# Patient Record
Sex: Female | Born: 1965
Health system: Southern US, Community
[De-identification: ages and names within clinical notes are randomized; demographics above are authoritative.]

## PROBLEM LIST (undated history)

## (undated) DIAGNOSIS — E119 Type 2 diabetes mellitus without complications: Secondary | ICD-10-CM

## (undated) DIAGNOSIS — I10 Essential (primary) hypertension: Secondary | ICD-10-CM

---

## 2020-10-12 ENCOUNTER — Other Ambulatory Visit: Payer: Self-pay

## 2020-10-12 ENCOUNTER — Emergency Department (HOSPITAL_COMMUNITY): Payer: BC Managed Care – PPO

## 2020-10-12 ENCOUNTER — Emergency Department (HOSPITAL_BASED_OUTPATIENT_CLINIC_OR_DEPARTMENT_OTHER): Payer: BC Managed Care – PPO

## 2020-10-12 ENCOUNTER — Encounter (HOSPITAL_BASED_OUTPATIENT_CLINIC_OR_DEPARTMENT_OTHER): Payer: Self-pay | Admitting: Emergency Medicine

## 2020-10-12 ENCOUNTER — Inpatient Hospital Stay (HOSPITAL_BASED_OUTPATIENT_CLINIC_OR_DEPARTMENT_OTHER)
Admission: EM | Admit: 2020-10-12 | Discharge: 2020-10-18 | DRG: 065 | Disposition: A | Discharge status: Rehabilitation Facility | Payer: BC Managed Care – PPO | Attending: Family Medicine | Admitting: Family Medicine

## 2020-10-12 DIAGNOSIS — R29898 Other symptoms and signs involving the musculoskeletal system: Secondary | ICD-10-CM

## 2020-10-12 DIAGNOSIS — I672 Cerebral atherosclerosis: Secondary | ICD-10-CM | POA: Diagnosis not present

## 2020-10-12 DIAGNOSIS — I6521 Occlusion and stenosis of right carotid artery: Secondary | ICD-10-CM | POA: Diagnosis present

## 2020-10-12 DIAGNOSIS — R471 Dysarthria and anarthria: Secondary | ICD-10-CM | POA: Diagnosis not present

## 2020-10-12 DIAGNOSIS — R001 Bradycardia, unspecified: Secondary | ICD-10-CM | POA: Diagnosis not present

## 2020-10-12 DIAGNOSIS — I639 Cerebral infarction, unspecified: Secondary | ICD-10-CM | POA: Diagnosis not present

## 2020-10-12 DIAGNOSIS — E876 Hypokalemia: Secondary | ICD-10-CM | POA: Diagnosis not present

## 2020-10-12 DIAGNOSIS — Z79899 Other long term (current) drug therapy: Secondary | ICD-10-CM

## 2020-10-12 DIAGNOSIS — I447 Left bundle-branch block, unspecified: Secondary | ICD-10-CM | POA: Diagnosis present

## 2020-10-12 DIAGNOSIS — M549 Dorsalgia, unspecified: Secondary | ICD-10-CM | POA: Diagnosis present

## 2020-10-12 DIAGNOSIS — Y92239 Unspecified place in hospital as the place of occurrence of the external cause: Secondary | ICD-10-CM | POA: Diagnosis not present

## 2020-10-12 DIAGNOSIS — M6281 Muscle weakness (generalized): Secondary | ICD-10-CM | POA: Diagnosis not present

## 2020-10-12 DIAGNOSIS — E785 Hyperlipidemia, unspecified: Secondary | ICD-10-CM | POA: Diagnosis present

## 2020-10-12 DIAGNOSIS — R32 Unspecified urinary incontinence: Secondary | ICD-10-CM | POA: Diagnosis not present

## 2020-10-12 DIAGNOSIS — M25362 Other instability, left knee: Secondary | ICD-10-CM | POA: Diagnosis not present

## 2020-10-12 DIAGNOSIS — Z6841 Body Mass Index (BMI) 40.0 and over, adult: Secondary | ICD-10-CM | POA: Diagnosis not present

## 2020-10-12 DIAGNOSIS — R29702 NIHSS score 2: Secondary | ICD-10-CM | POA: Diagnosis not present

## 2020-10-12 DIAGNOSIS — H02402 Unspecified ptosis of left eyelid: Secondary | ICD-10-CM | POA: Diagnosis present

## 2020-10-12 DIAGNOSIS — I69354 Hemiplegia and hemiparesis following cerebral infarction affecting left non-dominant side: Secondary | ICD-10-CM | POA: Diagnosis not present

## 2020-10-12 DIAGNOSIS — K5900 Constipation, unspecified: Secondary | ICD-10-CM | POA: Diagnosis not present

## 2020-10-12 DIAGNOSIS — I10 Essential (primary) hypertension: Secondary | ICD-10-CM | POA: Diagnosis present

## 2020-10-12 DIAGNOSIS — I6522 Occlusion and stenosis of left carotid artery: Secondary | ICD-10-CM | POA: Diagnosis not present

## 2020-10-12 DIAGNOSIS — Z20822 Contact with and (suspected) exposure to covid-19: Secondary | ICD-10-CM | POA: Diagnosis present

## 2020-10-12 DIAGNOSIS — I69398 Other sequelae of cerebral infarction: Secondary | ICD-10-CM | POA: Diagnosis not present

## 2020-10-12 DIAGNOSIS — Z823 Family history of stroke: Secondary | ICD-10-CM | POA: Diagnosis not present

## 2020-10-12 DIAGNOSIS — I671 Cerebral aneurysm, nonruptured: Secondary | ICD-10-CM | POA: Diagnosis present

## 2020-10-12 DIAGNOSIS — E119 Type 2 diabetes mellitus without complications: Secondary | ICD-10-CM | POA: Diagnosis not present

## 2020-10-12 DIAGNOSIS — I6389 Other cerebral infarction: Secondary | ICD-10-CM | POA: Diagnosis not present

## 2020-10-12 DIAGNOSIS — I6381 Other cerebral infarction due to occlusion or stenosis of small artery: Secondary | ICD-10-CM | POA: Diagnosis not present

## 2020-10-12 DIAGNOSIS — R29810 Facial weakness: Secondary | ICD-10-CM | POA: Diagnosis present

## 2020-10-12 DIAGNOSIS — G8194 Hemiplegia, unspecified affecting left nondominant side: Secondary | ICD-10-CM | POA: Diagnosis not present

## 2020-10-12 DIAGNOSIS — M545 Low back pain, unspecified: Secondary | ICD-10-CM | POA: Diagnosis not present

## 2020-10-12 DIAGNOSIS — R079 Chest pain, unspecified: Secondary | ICD-10-CM | POA: Diagnosis not present

## 2020-10-12 DIAGNOSIS — E8809 Other disorders of plasma-protein metabolism, not elsewhere classified: Secondary | ICD-10-CM | POA: Diagnosis not present

## 2020-10-12 DIAGNOSIS — R29818 Other symptoms and signs involving the nervous system: Secondary | ICD-10-CM | POA: Diagnosis not present

## 2020-10-12 DIAGNOSIS — W19XXXA Unspecified fall, initial encounter: Secondary | ICD-10-CM | POA: Diagnosis not present

## 2020-10-12 DIAGNOSIS — I63233 Cerebral infarction due to unspecified occlusion or stenosis of bilateral carotid arteries: Secondary | ICD-10-CM | POA: Diagnosis not present

## 2020-10-12 DIAGNOSIS — M21332 Wrist drop, left wrist: Secondary | ICD-10-CM | POA: Diagnosis not present

## 2020-10-12 HISTORY — DX: Type 2 diabetes mellitus without complications: E11.9

## 2020-10-12 HISTORY — DX: Essential (primary) hypertension: I10

## 2020-10-12 LAB — CBC
HCT: 41.5 % (ref 36.0–46.0)
HCT: 39 % (ref 36.0–46.0)
Hemoglobin: 13.4 g/dL (ref 12.0–15.0)
Hemoglobin: 12.4 g/dL (ref 12.0–15.0)
MCH: 26.4 pg (ref 26.0–34.0)
MCH: 26.1 pg (ref 26.0–34.0)
MCHC: 32.3 g/dL (ref 30.0–36.0)
MCHC: 31.8 g/dL (ref 30.0–36.0)
MCV: 81.9 fL (ref 80.0–100.0)
MCV: 82.1 fL (ref 80.0–100.0)
Platelets: 168 10*3/uL (ref 150–400)
Platelets: 155 10*3/uL (ref 150–400)
RBC: 5.07 MIL/uL (ref 3.87–5.11)
RBC: 4.75 MIL/uL (ref 3.87–5.11)
RDW: 13.3 % (ref 11.5–15.5)
RDW: 13.1 % (ref 11.5–15.5)
WBC: 6.4 10*3/uL (ref 4.0–10.5)
WBC: 6 10*3/uL (ref 4.0–10.5)
nRBC: 0 % (ref 0.0–0.2)
nRBC: 0 % (ref 0.0–0.2)

## 2020-10-12 LAB — URINALYSIS, ROUTINE W REFLEX MICROSCOPIC
Bilirubin Urine: NEGATIVE
Glucose, UA: NEGATIVE mg/dL
Hgb urine dipstick: NEGATIVE
Ketones, ur: 5 mg/dL — AB
Nitrite: NEGATIVE
Protein, ur: NEGATIVE mg/dL
Specific Gravity, Urine: 1.02 (ref 1.005–1.030)
pH: 6.5 (ref 5.0–8.0)

## 2020-10-12 LAB — CBG MONITORING, ED: Glucose-Capillary: 85 mg/dL (ref 70–99)

## 2020-10-12 LAB — URINALYSIS, MICROSCOPIC (REFLEX)

## 2020-10-12 LAB — COMPREHENSIVE METABOLIC PANEL
ALT: 13 U/L (ref 0–44)
AST: 25 U/L (ref 15–41)
Albumin: 4.1 g/dL (ref 3.5–5.0)
Alkaline Phosphatase: 68 U/L (ref 38–126)
Anion gap: 11 (ref 5–15)
BUN: 8 mg/dL (ref 6–20)
CO2: 26 mmol/L (ref 22–32)
Calcium: 10.1 mg/dL (ref 8.9–10.3)
Chloride: 101 mmol/L (ref 98–111)
Creatinine, Ser: 0.73 mg/dL (ref 0.44–1.00)
GFR, Estimated: >60 mL/min (ref >60)
Glucose, Bld: 126 mg/dL — ABNORMAL HIGH (ref 70–99)
Potassium: 3.2 mmol/L — ABNORMAL LOW (ref 3.5–5.1)
Sodium: 138 mmol/L (ref 135–145)
Total Bilirubin: 0.6 mg/dL (ref 0.3–1.2)
Total Protein: 7.4 g/dL (ref 6.5–8.1)

## 2020-10-12 LAB — SARS CORONAVIRUS 2 BY RT PCR (HOSPITAL ORDER, PERFORMED IN ~~LOC~~ HOSPITAL LAB): SARS Coronavirus 2: NEGATIVE

## 2020-10-12 LAB — DIFFERENTIAL
Abs Immature Granulocytes: 0.02 10*3/uL (ref 0.00–0.07)
Basophils Absolute: 0 10*3/uL (ref 0.0–0.1)
Basophils Relative: 1 %
Eosinophils Absolute: 0 10*3/uL (ref 0.0–0.5)
Eosinophils Relative: 1 %
Immature Granulocytes: 0 %
Lymphocytes Relative: 16 %
Lymphs Abs: 1 10*3/uL (ref 0.7–4.0)
Monocytes Absolute: 0.5 10*3/uL (ref 0.1–1.0)
Monocytes Relative: 7 %
Neutro Abs: 4.9 10*3/uL (ref 1.7–7.7)
Neutrophils Relative %: 75 %

## 2020-10-12 LAB — APTT: aPTT: 30 seconds (ref 24–36)

## 2020-10-12 LAB — RAPID URINE DRUG SCREEN, HOSP PERFORMED
Amphetamines: NOT DETECTED
Barbiturates: NOT DETECTED
Benzodiazepines: NOT DETECTED
Cocaine: NOT DETECTED
Opiates: NOT DETECTED
Tetrahydrocannabinol: NOT DETECTED

## 2020-10-12 LAB — PROTIME-INR
INR: 1 (ref 0.8–1.2)
Prothrombin Time: 12.3 seconds (ref 11.4–15.2)

## 2020-10-12 LAB — CREATININE, SERUM
Creatinine, Ser: 0.76 mg/dL (ref 0.44–1.00)
GFR, Estimated: >60 mL/min (ref >60)

## 2020-10-12 LAB — HIV ANTIBODY (ROUTINE TESTING W REFLEX): HIV Screen 4th Generation wRfx: NONREACTIVE

## 2020-10-12 LAB — ETHANOL: Alcohol, Ethyl (B): <10 mg/dL (ref <10)

## 2020-10-12 LAB — PREGNANCY, URINE: Preg Test, Ur: NEGATIVE

## 2020-10-12 MED ORDER — POTASSIUM CHLORIDE CRYS ER 20 MEQ PO TBCR
40.0000 meq | EXTENDED_RELEASE_TABLET | Freq: Once | ORAL | Status: AC
  Administered 2020-10-12: 40 meq via ORAL
  Filled 2020-10-12: qty 2

## 2020-10-12 MED ORDER — LABETALOL HCL 5 MG/ML IV SOLN: 5.0000 mg | INTRAVENOUS | Status: DC | PRN

## 2020-10-12 MED ORDER — CLOPIDOGREL BISULFATE 75 MG PO TABS
75.0000 mg | ORAL_TABLET | Freq: Every day | ORAL | Status: DC
  Administered 2020-10-13 – 2020-10-18 (×6): 75 mg via ORAL
  Filled 2020-10-12 (×7): qty 1

## 2020-10-12 MED ORDER — ACETAMINOPHEN 650 MG RE SUPP: 650.0000 mg | RECTAL | Status: DC | PRN

## 2020-10-12 MED ORDER — ACETAMINOPHEN 325 MG PO TABS
650.0000 mg | ORAL_TABLET | ORAL | Status: DC | PRN
  Filled 2020-10-12: qty 2

## 2020-10-12 MED ORDER — ACETAMINOPHEN 160 MG/5ML PO SOLN: 650.0000 mg | ORAL | Status: DC | PRN

## 2020-10-12 MED ORDER — ENOXAPARIN SODIUM 40 MG/0.4ML ~~LOC~~ SOLN
40.0000 mg | SUBCUTANEOUS | Status: DC
  Administered 2020-10-12 – 2020-10-17 (×5): 40 mg via SUBCUTANEOUS
  Filled 2020-10-12 (×7): qty 0.4

## 2020-10-12 MED ORDER — STROKE: EARLY STAGES OF RECOVERY BOOK
Freq: Once | Status: AC
  Filled 2020-10-12 (×2): qty 1

## 2020-10-12 MED ORDER — ASPIRIN EC 325 MG PO TBEC
325.0000 mg | DELAYED_RELEASE_TABLET | Freq: Once | ORAL | Status: AC
  Administered 2020-10-12: 325 mg via ORAL
  Filled 2020-10-12: qty 1

## 2020-10-12 MED ORDER — ASPIRIN 81 MG PO CHEW
81.0000 mg | CHEWABLE_TABLET | Freq: Every day | ORAL | Status: DC
  Administered 2020-10-12 – 2020-10-18 (×7): 81 mg via ORAL
  Filled 2020-10-12 (×7): qty 1

## 2020-10-12 MED ORDER — CLOPIDOGREL BISULFATE 75 MG PO TABS
300.0000 mg | ORAL_TABLET | Freq: Once | ORAL | Status: AC
  Administered 2020-10-13: 300 mg via ORAL
  Filled 2020-10-12: qty 4

## 2020-10-12 MED ORDER — SENNOSIDES-DOCUSATE SODIUM 8.6-50 MG PO TABS
1.0000 | ORAL_TABLET | Freq: Every evening | ORAL | Status: DC | PRN
  Administered 2020-10-15: 1 via ORAL
  Filled 2020-10-12: qty 1

## 2020-10-12 NOTE — ED Triage Notes (Signed)
Pt reports lower back pain , left leg pain , left arm pain. Also weakness tp left leg. Pt stood up and walked few steps to bed.  Alert and oriented x 4. Hx stroke 4 years ago , per pt. No facial asymmetry nor droop.

## 2020-10-12 NOTE — H&P (Signed)
Family Medicine Teaching [facility] Admission History and Physical Service Pager: [phone]  Patient name: Shannon Garza Medical record number: [id number] Date of birth: 1966/03/26 Age: 55 y.o. Gender: female  Primary Care Provider: Patient, No Pcp Per Consultants: Neuro Code Status: Full  Chief Complaint: Left sided weakness  Assessment and Plan: Shannon Garza is a 55 y.o. right handed female presenting with acute CVA. PMH is significant for history of CVA, DM, HTN.  Acute CVA Her initial presentation is notable for weakness of her left leg, left arm and left-sided facial drooping.  She notes this is very similar to previous presentation of a stroke.  CT head was initially unremarkable showing no evidence of acute hemorrhage.  MR brain showed findings consistent with acute/subacute infarct of the internal capsule in addition to remote infarct of left basal ganglia and left pons.  She has a known history of stroke in addition to diabetes and hypertension.  She takes no medication at home currently.  She continues to demonstrate left-sided deficits.  She is outside the TPA window.  Neurology has not yet been able to assess the patient.  We will admit for appropriate stroke work-up and therapy.  Findings thus far are consistent with CVA secondary to long-term hypertension. -Admit to med telemetry, attending Dr. Miquel Dunn -Neurology consulted, appreciate recommendations, Dr. Amada Jupiter is aware of pt -Permit hypertension for the next 48 hours with a goal of under 220 systolic and 110 diastolic unless otherwise advised by neurology -Aspirin  -Follow-up PT/OT/SLP -Follow-up echo -Monitor telemetry  -follow-up risk stratification labs  Diabetes Currently diet controlled at home. -Follow-up A1c -Blood glucose on morning labs  Hypertension Systolic ranging 938-182.  This is likely related to her known history of hypertension for which she takes no medication.  This is also likely  elevated in the setting of acute stroke.  We will permit hypertension for the next 48 hours unless otherwise recommended by neurology. -Permissive hypertension for 48 hours: Goal 220 systolic, 110 diastolic -Labetalol as needed ordered  FEN/GI: Clear liquid diet ordered, she passed the bedside swallow Prophylaxis: Lovenox  Disposition: 1-2 additional days of hospitalization anticipated prior to discharge  History of Present Illness:  Shannon Garza is a 55 y.o. female presenting with acute CVA. PMH is significant for history of CVA, DM, HTN.  Ms. Komar reports that she first started noticing symptoms yesterday morning.  Her first symptom was weakness of her left leg.  She notes that she was still able to walk around yesterday although felt that her balance was mildly impaired.  She stayed home from work yesterday hoping that her symptoms would improve.  She woke up this morning with weakness in her left leg and right arm.  With the addition of this arm weakness, she grew increasingly concerned about the possibility of a stroke.  She notes that this reminded her of a previous stroke which left her with right-sided deficits.  With the onset of the left arm weakness, she decided it was time for her to go to the hospital for further investigation and treatment.  Throughout the day, she also noted some weakness of the left side of her face.  In addition to the above noted findings, she is also had some mild urinary incontinence which began today.  She was initially seen at [facility] where she had a head CT demonstrating no hemorrhagic infarct.  She was then transferred to the [facility], ED where MRI brain demonstrated findings consistent with acute/subacute infarct.  At that time, she was called for admission.  Neurology was contacted in the ED and reported they would consult.  Neurology had not seen the patient at the time of the admission.  Review Of Systems: Per HPI with the  following additions:   Review of Systems  Constitutional: Negative for fever.  HENT: Negative for congestion and sore throat.   Eyes: Negative for blurred vision.  Respiratory: Negative for cough and shortness of breath.   Cardiovascular: Negative for chest pain and palpitations.  Gastrointestinal: Negative for abdominal pain, blood in stool, constipation, diarrhea, nausea and vomiting.  Genitourinary: Positive for frequency. Negative for dysuria.  Musculoskeletal: Negative for myalgias.  Skin: Negative for rash.  Neurological: Positive for weakness. Negative for dizziness, tremors and loss of consciousness.    Patient Active Problem List   Diagnosis Date Noted  . CVA (cerebral vascular accident) (HCC) 10/12/2020    Past Medical History: Past Medical History:  Diagnosis Date  . Diabetes mellitus without complication (HCC)   . Hypertension     Past Surgical History: History reviewed. No pertinent surgical history.  Social History: Social History   Substance Use Topics  . Alcohol use: Not Currently  . Drug use: Not Currently    Family History: History reviewed. No pertinent family history.  Allergies and Medications: No Known Allergies No current facility-administered medications on file prior to encounter.   Current Outpatient Medications on File Prior to Encounter  Medication Sig Dispense Refill  . DANDELION PO Take 1 tablet by mouth daily.    . Ferrous Sulfate (IRON PO) Take 1 tablet by mouth daily.    Marland Kitchen HAWTHORN PO Take 1 tablet by mouth daily.    Marland Kitchen MAGNESIUM PO Take 1 tablet by mouth daily.    Marland Kitchen POTASSIUM PO Take 1 tablet by mouth daily. Over the counter    . Turmeric (QC TUMERIC COMPLEX PO) Take 1 tablet by mouth daily.    . vitamin C (ASCORBIC ACID) 500 MG tablet Take 1,000 mg by mouth daily.      Objective: BP (!) 170/40 (BP Location: Right Arm)   Pulse (!) 59   Temp 98.4 F (36.9 C) (Oral)   Resp 18   Ht 5\' 3"  (1.6 m)   Wt 130.6 kg   SpO2 100%    BMI 51.02 kg/m  Exam: Physical Exam Constitutional:      General: She is not in acute distress. HENT:     Head: Atraumatic.     Mouth/Throat:     Mouth: Mucous membranes are moist.     Pharynx: Oropharynx is clear.  Eyes:     Extraocular Movements: Extraocular movements intact.     Conjunctiva/sclera: Conjunctivae normal.     Pupils: Pupils are equal, round, and reactive to light.  Pulmonary:     Effort: Pulmonary effort is normal.  Abdominal:     Palpations: Abdomen is soft.  Musculoskeletal:        General: No swelling or deformity.     Cervical back: Normal range of motion and neck supple.  Skin:    General: Skin is warm and dry.  Neurological:     Mental Status: She is alert and oriented to person, place, and time. Mental status is at baseline.     GCS: GCS eye subscore is 4. GCS verbal subscore is 5. GCS motor subscore is 6.     Cranial Nerves: Facial asymmetry (left sided ptosis and skewed smile) present. No dysarthria.     Sensory:  Sensation is intact.     Motor: Weakness present.     Comments: Cranial nerve exam was notable for ptosis of the left eyelid and a weak smile on the left-hand side.  Indicating pathology of cranial nerve VII.  No additional findings on cranial nerve exam.  No dysarthria. Strength: 4/5 strength in the upper extremities for grip strength, elbow flexion, elbow extension.  5/5 strength on the right side for grip strength, elbow flexion, elbow extension.     Labs and Imaging: CBC BMET  Recent Labs  Lab 10/12/20 1009  WBC 6.4  HGB 13.4  HCT 41.5  PLT 168   Recent Labs  Lab 10/12/20 1009  NA 138  K 3.2*  CL 101  CO2 26  BUN 8  CREATININE 0.73  GLUCOSE 126*  CALCIUM 10.1     CT HEAD WO CONTRAST  Result Date: 10/12/2020 CLINICAL DATA:  Neuro deficit, acute, stroke suspected. Additional history provided: Left-sided weakness since yesterday, low back pain. EXAM: CT HEAD WITHOUT CONTRAST TECHNIQUE: Contiguous axial images were obtained  from the base of the skull through the vertex without intravenous contrast. COMPARISON:  No pertinent prior exams available for comparison. FINDINGS: Brain: Cerebral volume is normal for age. Chronic small-vessel infarct within the left basal ganglia and internal capsule. Small chronic lacunar infarct within the right caudate nucleus. A 3 mm focus of hypodensity in the inferior right basal ganglia likely reflects a prominent perivascular space. Advanced ill-defined hypoattenuation within the cerebral white matter is nonspecific, but most often secondary to chronic small vessel ischemia. There is no acute intracranial hemorrhage. No demarcated cortical infarct. No extra-axial fluid collection. No evidence of intracranial mass. No midline shift. Partially empty sella turcica. Vascular: No hyperdense vessel.  Atherosclerotic calcifications. Skull: Normal. Negative for fracture or focal lesion. Sinuses/Orbits: Visualized orbits show no acute finding. Trace ethmoid sinus mucosal thickening. IMPRESSION: No evidence of acute intracranial abnormality. Chronic small-vessel infarct within the left basal ganglia and internal capsule. Chronic right basal ganglia lacunar infarct. Background advanced cerebral white matter disease, nonspecific but most often secondary to chronic small vessel ischemia. Partially empty sella turcica. This finding is very commonly incidental, but can be associated with idiopathic intracranial hypertension. Electronically Signed   By: Jackey LogeKyle  Golden DO   On: 10/12/2020 09:57   MR Brain Wo Contrast (neuro protocol)  Addendum Date: 10/12/2020   ADDENDUM REPORT: 10/12/2020 15:32 ADDENDUM: These results were called by telephone on 10/12/2020 at 3:30 pm to provider [facility] , who verbally acknowledged these results. Electronically Signed   By: Baldemar LenisKatyucia  De Macedo Rodrigues M.D.   On: 10/12/2020 15:32   Result Date: 10/12/2020 CLINICAL DATA:  Neuro deficit, acute, stroke suspected. EXAM: MRI HEAD WITHOUT  CONTRAST TECHNIQUE: Multiplanar, multiecho pulse sequences of the brain and surrounding structures were obtained without intravenous contrast. COMPARISON:  Head CT October 12, 2020 FINDINGS: Brain: Area of restricted diffusion involving the posterior limb of internal capsule on the right extending into the corona radiata, consistent with acute/subacute infarct. Remote infarct is seen in the left basal ganglia region and left side of the pons. Scattered confluent foci of T2 hyperintensity are seen within the white matter of the cerebral hemispheres, nonspecific, more pronounced than expected for patient's age. No hemorrhage, hydrocephalus, extra-axial collection or mass lesion. Vascular: Normal flow voids. Skull and upper cervical spine: Normal marrow signal. Sinuses/Orbits: Mucous retention cyst in the left maxillary sinus. The orbits are maintained. IMPRESSION: 1. Area of restricted diffusion involving the posterior limb of internal  capsule on the right extending into the corona radiata, consistent with acute/subacute infarct. 2. Remote infarct in the left basal ganglia region and left side of the pons. 3. Scattered confluent foci of T2 hyperintensity are seen within the white matter of the cerebral hemispheres, nonspecific, more pronounced than expected for patient's age. Findings may represent chronic microvascular ischemic changes. Electronically Signed: By: Baldemar Lenis M.D. On: 10/12/2020 15:22     Mirian Mo, MD 10/12/2020, 8:02 PM PGY-3, [place] [facility] FPTS Intern pager: [phone], text pages welcome

## 2020-10-12 NOTE — ED Provider Notes (Signed)
Patient transferred from [facility] for MRI.  Left-sided weakness, upper and lower extremity.  History of hypertension , diabetes.  Neuro consulted --they will see the patient.   Derwood Kaplan, MD 10/12/20 [phone]

## 2020-10-12 NOTE — ED Notes (Signed)
Patient transported to MRI 

## 2020-10-12 NOTE — ED Notes (Signed)
Spoke with MRI about patient, patient denies being claustrophobic, states she has had an MRI previously without complications, no metal or piercing's.

## 2020-10-12 NOTE — ED Notes (Signed)
Patient to Bathroom via wheelchair with ER tech

## 2020-10-12 NOTE — Consult Note (Signed)
Neurology Consultation Reason for Consult: Stroke Referring Physician: Lum Babe, K  CC: Left-sided weakness  History is obtained from: Patient  HPI: Shannon Garza is a 55 y.o. female reports a previous stroke in 2018.  She has a history of hypertension and diabetes, but decided to go via herbal medicine route rather than taking other medications.  She was in her normal state of health when she went to bed on 2/13, but on 2/14 she noticed that she had some mild left leg weakness.  This worsened overnight and included her arm and this prompted her to seek care today.  She states that she had similar symptoms with her previous stroke.  She does not take any antiplatelets.   LKW: 2/13 tpa given?: no, outside window    ROS: A 14 point ROS was performed and is negative except as noted in the HPI.  Past Medical History:  Diagnosis Date  . Diabetes mellitus without complication (HCC)   . Hypertension      Family history: Grandmother-stroke   Social History:  reports previous alcohol use. She reports previous drug use. No history on file for tobacco use.   Exam: Current vital signs: BP (!) 175/75 (BP Location: Right Arm)   Pulse 62   Temp 98.4 F (36.9 C) (Oral)   Resp 18   Ht 5\' 3"  (1.6 m)   Wt 130.6 kg   SpO2 100%   BMI 51.02 kg/m  Vital signs in last 24 hours: Temp:  [98.2 F (36.8 C)-98.6 F (37 C)] 98.4 F (36.9 C) (02/15 2227) Pulse Rate:  [55-69] 62 (02/15 2227) Resp:  [16-20] 18 (02/15 2227) BP: (166-194)/(40-84) 175/75 (02/15 2227) SpO2:  [99 %-100 %] 100 % (02/15 2227) Weight:  [130.6 kg] 130.6 kg (02/15 0924)   Physical Exam  Constitutional: Appears well-developed and well-nourished.  Psych: Affect appropriate to situation Eyes: No scleral injection HENT: No OP obstruction MSK: no joint deformities.  Cardiovascular: Normal rate and regular rhythm.  Respiratory: Effort normal, non-labored breathing GI: Soft.  No distension. There is no tenderness.   Skin: WDI  Neuro: Mental Status: Patient is awake, alert, oriented to person, place, month, year, and situation. Patient is able to give a clear and coherent history. No signs of aphasia or neglect Cranial Nerves: II: Visual Fields are full. Pupils are equal, round, and reactive to light.   III,IV, VI: EOMI without ptosis or diploplia.  V: Facial sensation is symmetric to temperature VII: Facial movement is diminished on the left VIII: hearing is intact to voice X: Uvula elevates symmetrically XI: Shoulder shrug is symmetric. XII: tongue is midline without atrophy or fasciculations.  Motor: Tone is normal. Bulk is normal. 4-/5 strength was present in left arm and 4/5 in the left leg.  Sensory: Sensation is symmetric to light touch Cerebellar: FNF and HKS are intact on the right, consistent with weakness on the left.       I have reviewed labs in epic and the results pertinent to this consultation are: Cr 0.73  I have reviewed the images obtained: MRI brain-subcortical infarct on the right  Impression: 55 year old female with a history of previous stroke, diabetes and hypertension which are untreated due to the patient's pursuing a naturopathic approach.  I suspect there is a small vessel disease due to her untreated comorbidities.  I would favor dual antiplatelet therapy for 3 weeks followed by monotherapy.  Recommendations: - HgbA1c, fasting lipid panel - Frequent neuro checks - Echocardiogram - Prophylactic therapy-aspirin 81 mg  and Plavix 75 mg daily after 300 mg load. - Risk factor modification - Telemetry monitoring - PT consult, OT consult, Speech consult - Stroke team to follow  Ritta Slot, MD [facility] [phone]  If 7pm- 7am, please page neurology on call as listed in AMION.

## 2020-10-12 NOTE — ED Notes (Signed)
Patient transported to CT 

## 2020-10-12 NOTE — ED Notes (Signed)
Patient back from MRI.

## 2020-10-12 NOTE — ED Notes (Signed)
Informed Ikrame - RN of pt's BP

## 2020-10-12 NOTE — ED Provider Notes (Signed)
[facility] EMERGENCY DEPARTMENT Provider Note   CSN: [id number] Arrival date & time: 10/12/20  0900     History Chief Complaint  Patient presents with  . Back Pain    lower    Shannon Garza is a 55 y.o. female.  She has a history of diabetes hypertension and a prior stroke 4 years ago which caused some speech difficulty.  Since resolved.  Complaining of left leg weakness that began yesterday morning.  Causing her difficulty ambulating.  Today also noticed some weakness in her left arm, leg weakness persists.  Feels a little strange on the left side of her face.  No numbness.  No falls.  No blurry vision double vision.  Mild right-sided headache that is since resolved.  No trauma.  Also has some left-sided back pain.  The history is provided by the patient.  Cerebrovascular Accident This is a new problem. The current episode started yesterday. The problem occurs constantly. The problem has not changed since onset.Associated symptoms include headaches. Pertinent negatives include no chest pain, no abdominal pain and no shortness of breath. The symptoms are aggravated by walking. Nothing relieves the symptoms. She has tried nothing for the symptoms. The treatment provided no relief.       Past Medical History:  Diagnosis Date  . Diabetes mellitus without complication (HCC)   . Hypertension     There are no problems to display for this patient.   History reviewed. No pertinent surgical history.   OB History   No obstetric history on file.     History reviewed. No pertinent family history.  Social History   Substance Use Topics  . Alcohol use: Not Currently  . Drug use: Not Currently    Home Medications Prior to Admission medications   Not on File    Allergies    Patient has no known allergies.  Review of Systems   Review of Systems  Constitutional: Negative for fever.  HENT: Negative for sore throat.   Eyes: Negative for visual disturbance.   Respiratory: Negative for shortness of breath.   Cardiovascular: Negative for chest pain.  Gastrointestinal: Negative for abdominal pain.  Genitourinary: Negative for dysuria.  Musculoskeletal: Positive for back pain.  Skin: Negative for rash.  Neurological: Positive for weakness and headaches. Negative for speech difficulty.    Physical Exam Updated Vital Signs BP (!) 194/79 (BP Location: Right Arm)   Pulse 68   Temp 98.3 F (36.8 C) (Oral)   Resp 18   Ht 5\' 3"  (1.6 m)   Wt 130.6 kg   SpO2 99%   BMI 51.02 kg/m   Physical Exam Vitals and nursing note reviewed.  Constitutional:      General: She is not in acute distress.    Appearance: Normal appearance. She is well-developed and well-nourished.  HENT:     Head: Normocephalic and atraumatic.  Eyes:     Conjunctiva/sclera: Conjunctivae normal.  Cardiovascular:     Rate and Rhythm: Normal rate and regular rhythm.     Heart sounds: No murmur heard.   Pulmonary:     Effort: Pulmonary effort is normal. No respiratory distress.     Breath sounds: Normal breath sounds.  Abdominal:     Palpations: Abdomen is soft.     Tenderness: There is no abdominal tenderness.  Musculoskeletal:        General: No edema.     Cervical back: Neck supple.  Skin:    General: Skin is warm and dry.  Neurological:     Mental Status: She is alert and oriented to person, place, and time.     Comments: She has no facial asymmetry.  Normal sensation to her face.  Tongue is midline.  Normal speech.  Normal shoulder shrug and biceps and triceps.  May be subtle hand weakness.  No pronator drift.  Legs subtle weakness on the left.  Normal sensation arms and legs.  Finger-to-nose is normal.  She says it feels a little more difficult to do on the left side.  Heel shin normal.  Gait deferred  Psychiatric:        Mood and Affect: Mood and affect normal.     ED Results / Procedures / Treatments   Labs (all labs ordered are listed, but only abnormal  results are displayed) Labs Reviewed  URINALYSIS, ROUTINE W REFLEX MICROSCOPIC - Abnormal; Notable for the following components:      Result Value   Ketones, ur 5 (*)    Leukocytes,Ua SMALL (*)    All other components within normal limits  URINALYSIS, MICROSCOPIC (REFLEX) - Abnormal; Notable for the following components:   Bacteria, UA MANY (*)    All other components within normal limits  SARS CORONAVIRUS 2 (HOSPITAL ORDER, PERFORMED IN [facility])  PROTIME-INR  APTT  CBC  DIFFERENTIAL  RAPID URINE DRUG SCREEN, HOSP PERFORMED  PREGNANCY, URINE  ETHANOL  COMPREHENSIVE METABOLIC PANEL  CBG MONITORING, ED    EKG EKG Interpretation  Date/Time:  Tuesday October 12 2020 10:01:26 EST Ventricular Rate:  61 PR Interval:    QRS Duration: 107 QT Interval:  417 QTC Calculation: 420 R Axis:   20 Text Interpretation: Sinus rhythm Incomplete left bundle branch block No old tracing to compare Confirmed by Meridee Score [phone]) on 10/12/2020 10:16:06 AM   Radiology CT HEAD WO CONTRAST  Result Date: 10/12/2020 CLINICAL DATA:  Neuro deficit, acute, stroke suspected. Additional history provided: Left-sided weakness since yesterday, low back pain. EXAM: CT HEAD WITHOUT CONTRAST TECHNIQUE: Contiguous axial images were obtained from the base of the skull through the vertex without intravenous contrast. COMPARISON:  No pertinent prior exams available for comparison. FINDINGS: Brain: Cerebral volume is normal for age. Chronic small-vessel infarct within the left basal ganglia and internal capsule. Small chronic lacunar infarct within the right caudate nucleus. A 3 mm focus of hypodensity in the inferior right basal ganglia likely reflects a prominent perivascular space. Advanced ill-defined hypoattenuation within the cerebral white matter is nonspecific, but most often secondary to chronic small vessel ischemia. There is no acute intracranial hemorrhage. No demarcated cortical infarct.  No extra-axial fluid collection. No evidence of intracranial mass. No midline shift. Partially empty sella turcica. Vascular: No hyperdense vessel.  Atherosclerotic calcifications. Skull: Normal. Negative for fracture or focal lesion. Sinuses/Orbits: Visualized orbits show no acute finding. Trace ethmoid sinus mucosal thickening. IMPRESSION: No evidence of acute intracranial abnormality. Chronic small-vessel infarct within the left basal ganglia and internal capsule. Chronic right basal ganglia lacunar infarct. Background advanced cerebral white matter disease, nonspecific but most often secondary to chronic small vessel ischemia. Partially empty sella turcica. This finding is very commonly incidental, but can be associated with idiopathic intracranial hypertension. Electronically Signed   By: Jackey Loge DO   On: 10/12/2020 09:57    Procedures Procedures   Medications Ordered in ED Medications  aspirin EC tablet 325 mg (325 mg Oral Given 10/12/20 1013)    ED Course  I have reviewed the triage vital signs and the  nursing notes.  Pertinent labs & imaging results that were available during my care of the patient were reviewed by me and considered in my medical decision making (see chart for details).  Clinical Course as of 10/12/20 1154  Tue Oct 12, 2020  1027 Discussed with neurology Dr. Derry Lory.  He is recommending aspirin.  Will need transfer to [facility] for MRI and ultimate disposition.  Permissive hypertension [MB]  1030 Discussed with [facility] physician Dr. Judd Lien who accepts the patient in transfer for MRI. [MB]  1033 Reviewed results with patient and she is agreeable to plan for transfer by ambulance to [facility] ED for facilitation of MRI. [MB]    Clinical Course User Index [MB] Terrilee Files, MD   MDM Rules/Calculators/A&P                         This patient complains of left arm and leg weakness back pain; this involves an extensive number of treatment Options and is a complaint that  carries with it a high risk of complications and Morbidity. The differential includes stroke, TIA, bleed, sciatica, metabolic derangement  I ordered, reviewed and interpreted labs, which included CBC with normal white count normal hemoglobin, urine tox negative, urinalysis without obvious signs of infection, pregnancy test negative, Covid testing pending I ordered medication aspirin I ordered imaging studies which included CT head and I independently    visualized and interpreted imaging which showed 2 old lacunar strokes no acute stroke.  Signs of small vessel disease Additional history obtained from patient's family member Previous records obtained and reviewed in epic, none I consulted neurology Dr. Derry Lory and discussed lab and imaging findings  Critical Interventions: None  After the interventions stated above, I reevaluated the patient and found her continue to subjectively feel weak on her left arm and her left leg.  Per neurology recommendations patient will need to be transferred to [facility] campus for MRI.  Disposition per results of testing there.   Final Clinical Impression(s) / ED Diagnoses Final diagnoses:  Left arm weakness  Left leg weakness  Primary hypertension    Rx / DC Orders ED Discharge Orders    None       Terrilee Files, MD 10/12/20 1156

## 2020-10-12 NOTE — ED Triage Notes (Signed)
Pt came from [facility]. Pt came to ED due to left sided weakness and hypertension. [facility] brought pt to [facility] for a MRI. Pt is axox4.

## 2020-10-12 NOTE — ED Notes (Signed)
Called report to Asher Muir , Consulting civil engineer at [employer] . Pt transfer via [facility]

## 2020-10-12 NOTE — ED Notes (Addendum)
Pt transported to CT ?

## 2020-10-13 ENCOUNTER — Inpatient Hospital Stay (HOSPITAL_COMMUNITY): Payer: BC Managed Care – PPO

## 2020-10-13 ENCOUNTER — Encounter (HOSPITAL_COMMUNITY): Payer: Self-pay | Admitting: Family Medicine

## 2020-10-13 DIAGNOSIS — R29898 Other symptoms and signs involving the musculoskeletal system: Secondary | ICD-10-CM | POA: Diagnosis not present

## 2020-10-13 DIAGNOSIS — I6389 Other cerebral infarction: Secondary | ICD-10-CM

## 2020-10-13 LAB — ECHOCARDIOGRAM COMPLETE
Area-P 1/2: 2.48 cm2
Height: 63 in
S' Lateral: 2.8 cm
Weight: 4608 oz

## 2020-10-13 LAB — BASIC METABOLIC PANEL
Anion gap: 10 (ref 5–15)
BUN: 6 mg/dL (ref 6–20)
CO2: 24 mmol/L (ref 22–32)
Calcium: 9.8 mg/dL (ref 8.9–10.3)
Chloride: 105 mmol/L (ref 98–111)
Creatinine, Ser: 0.75 mg/dL (ref 0.44–1.00)
GFR, Estimated: >60 mL/min (ref >60)
Glucose, Bld: 104 mg/dL — ABNORMAL HIGH (ref 70–99)
Potassium: 3.9 mmol/L (ref 3.5–5.1)
Sodium: 139 mmol/L (ref 135–145)

## 2020-10-13 LAB — LIPID PANEL
Cholesterol: 209 mg/dL — ABNORMAL HIGH (ref 0–200)
HDL: 52 mg/dL (ref >40)
LDL Cholesterol: 144 mg/dL — ABNORMAL HIGH (ref 0–99)
Total CHOL/HDL Ratio: 4 RATIO
Triglycerides: 66 mg/dL (ref <150)
VLDL: 13 mg/dL (ref 0–40)

## 2020-10-13 LAB — GLUCOSE, CAPILLARY: Glucose-Capillary: 97 mg/dL (ref 70–99)

## 2020-10-13 MED ORDER — LOSARTAN POTASSIUM 25 MG PO TABS
25.0000 mg | ORAL_TABLET | Freq: Every day | ORAL | Status: DC
Start: 2020-10-13 — End: 2020-10-14
  Administered 2020-10-13: 25 mg via ORAL
  Filled 2020-10-13: qty 1

## 2020-10-13 MED ORDER — IOHEXOL 350 MG/ML SOLN
75.0000 mL | Freq: Once | INTRAVENOUS | Status: AC | PRN
  Administered 2020-10-13: 75 mL via INTRAVENOUS

## 2020-10-13 NOTE — Progress Notes (Addendum)
Family Medicine on-call paged to request CBG order for this pt d/t h/o DM. Waiting on CBG order to be placed. Addendum: per MD notes, "pt's CBGs will be followed w/ labs". Pt c/o burning with urinating while purwick in place, but denied burning when urinating in toilet.

## 2020-10-13 NOTE — Hospital Course (Addendum)
Shannon Garza is a 55 y.o. right handed female who presented with left-sided weakness of left leg, arm, and face; subsequently diagnosed with acute CVA. PMH is significant for previous CVA, DM, HTN.  On admission, patient noted not taking any medications at home.    Acute CVA secondary to long standing hypertension Patient presented to ED for left-sided weakness in left leg, arm, and face, subsequently diagnosed with acute CVA outside of the TPA window. This presentation very similar to her previous stroke.  Initial CT negative for acute hemorrhage.  MR brain showed findings consistent with acute/subacute infarct of the internal capsule in addition to remote infarct of left basal ganglia and left pons.  PMH of diabetes and hypertension, takes no medications at home at this time.  Permissive hypertension in the first 48 hours up to 220 systolic, 110 diastolic.  Started on daily aspirin.  Echo demonstrated EF 60 to 65% with normal LV function, and moderate LV hypertrophy.  Neurology was consulted, recommended DAPT x3 weeks, then ASA 81mg  only.  Also recommended conservative management for carotid stenosis noted on imaging.  Lipid panel significant for total cholesterol 209, LDL 144; otherwise normal with total CHOL/HDL ratio 4.0, HDL 52, triglycerides 66, VLDL 13.  She was started on Lipitor 40mg  QD. PT, OT, SLP consulted and recommended CIR.  On day of discharge, patient maintained baseline neurologic status with residual left-sided upper extremity deficit.  Patient discharged on  aspirin 81 mg and plavix 75 mg daily for anticoagulation.  Hypertension On admission, patient systolic pressures ranging 170-194.  Known history of hypertension, without home medications at this time.  In setting of acute CVA, allowed permissive hypertension up to 220 systolic, 110 diastolic for the first 48 hours.  After 48 hours, patient's blood pressure was managed with lisinopril which was titrated to 20 mg. Patient discharged  on anti-hypertensive regimen of lisinopril monotherapy.   All other issues chronic and stable.  Discharge recommendations: She was discharged on DAPT for 3 weeks.  She should take ASA 81mg  and Plavix 75mg  for 3 weeks, then she should take Aspirin 81mg  ONLY. Continue atorvastatin 40 mg daily on discharge.  This was started during hospitalization after multiple conversations about importance with patient. She was started on Lisinopril 20 mg during hospitalization.  Recommend monitoring BP as outpatient and BMP in 1 month. Consider outpatient sleep study. Continue to monitor A1, consider repeat in 3 months.

## 2020-10-13 NOTE — Progress Notes (Signed)
  Echocardiogram 2D Echocardiogram has been performed.  Shannon Garza 10/13/2020, 1:58 PM

## 2020-10-13 NOTE — Plan of Care (Signed)

## 2020-10-13 NOTE — Evaluation (Addendum)
Occupational Therapy Evaluation Patient Details Name: Shannon Garza MRN: [id number] DOB: Feb 02, 1966 Today's Date: 10/13/2020    History of Present Illness 55 Y/O F with PMX of CVA 4 yrs ago, HTN, DM2, present with > 24 hrs hx of left LL weakness and left UE weakness.   Clinical Impression   Pt admitted with the above diagnoses and presents with below problem list. Pt will benefit from continued acute OT to address the below listed deficits and maximize independence with basic ADLs prior to d/c to next venue. 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 She lives with her sister and brother-in-law. Pt presents with LUE>LLE weakness, impaired balance, impaired cognition(?) (see details below) impacting current assist level with ADLs. Pt currently needs up to mod A with UB ADLs, up to min A with LB ADLs and functional mobility. Given age, good family support, and baseline independence with basic ADLs, and instrumental ADLs feel she would benefit from intensive rehab program.      Follow Up Recommendations  CIR;Supervision/Assistance - 24 hour    Equipment Recommendations  3 in 1 bedside commode    Recommendations for Other Services PT consult;Rehab consult     Precautions / Restrictions Precautions Precautions: Fall Restrictions Weight Bearing Restrictions: No Other Position/Activity Restrictions: acute LUE weakness      Mobility Bed Mobility Overal bed mobility: Needs Assistance Bed Mobility: Rolling;Sidelying to Sit Rolling: Min guard (partial roll to come to EOB) Sidelying to sit: Min assist;HOB elevated       General bed mobility comments: Assist to fully powerup trunk and advance L hip to full EOB position. Cues for technique. + use of bed rails    Transfers Overall transfer level: Needs assistance Equipment used: 1 person hand held assist Transfers: Sit to/from [employer] Sit to Stand: Min assist Stand pivot  transfers: Min assist       General transfer comment: Pt prematurely initiating standing and walking to recliner once EOB despite plan to sit EOB a minute first. Pt seeking single UE external support pivoting. to/from EOB and recliner    Balance Overall balance assessment: Needs assistance Sitting-balance support: Single extremity supported;Feet supported Sitting balance-Leahy Scale: Fair     Standing balance support: No upper extremity supported;Single extremity supported Standing balance-Leahy Scale: Fair Standing balance comment: seeks single extremity support                           ADL either performed or assessed with clinical judgement   ADL Overall ADL's : Needs assistance/impaired Eating/Feeding: Minimal assistance;Sitting Eating/Feeding Details (indicate cue type and reason): assist for bilateral tasks. Impairment is in nondominant UE Grooming: Moderate assistance;Sitting   Upper Body Bathing: Moderate assistance;Sitting   Lower Body Bathing: Minimal assistance;Sit to/from stand   Upper Body Dressing : Moderate assistance;Sitting   Lower Body Dressing: Minimal assistance;Sit to/from stand   Toilet Transfer: Minimal assistance Toilet Transfer Details (indicate cue type and reason): steadying assist to take pivotal steps to recliner Toileting- Clothing Manipulation and Hygiene: Minimal assistance;Sit to/from stand   Tub/ Engineer, structural: Tub transfer;Minimal assistance   Functional mobility during ADLs: Minimal assistance (steadying assist to take pivotal steps to recliner) General ADL Comments: Presents with LUE deficits in strength (3-/5 at all levels) and coordination, possibly some mild L side neglect? impacting UB/LB ADLs. A bit impulsive. steadying assist to take pivtal steps EOB to recliner     Vision Patient Visual Report:  No change from baseline       Perception     Praxis      Pertinent Vitals/Pain       Hand Dominance Right    Extremity/Trunk Assessment Upper Extremity Assessment Upper Extremity Assessment: LUE deficits/detail LUE Deficits / Details: 3-/5 shoulder and distal. benefits from multimodal cues. Impaired gross and fine motor coordination. Impaired proprioception (vs decreased control of movements d/t weakness?) LUE Sensation: decreased proprioception;decreased light touch LUE Coordination: decreased fine motor;decreased gross motor   Lower Extremity Assessment Lower Extremity Assessment: Defer to PT evaluation   Cervical / Trunk Assessment Cervical / Trunk Assessment: Normal   Communication Communication Communication: Other (comment);No difficulties (mild slurred speech at times)   Cognition Arousal/Alertness: Awake/alert Behavior During Therapy: [facility] for tasks assessed/performed;Impulsive Overall Cognitive Status: No family/caregiver present to determine baseline cognitive functioning                                 General Comments: Pt able to overall answer questions appropriately. Question her insight into current deficits. Pt initiating standing and walkng to recliner despite clear plan to sit EOB for a minute first. Decreased problem solving (vs mild L side neglect), Pt holding menu in right hand at right side. Cued to bring to L side to incorporate LUE   General Comments       Exercises Exercises: Other exercises Other Exercises Other Exercises: educated on AAROM exercises for LUE using RUE to complete full range at each level.   Shoulder Instructions      Home Living Family/patient expects to be discharged to:: Private residence Living Arrangements: Other relatives (brother and sister-in-law) Available Help at Discharge: Family;Available 24 hours/day (Sister) Type of Home: House Home Access: Stairs to enter [employer] of Steps: 2 Entrance Stairs-Rails: Right;Left;Can reach both Home Layout: Multi-level;Bed/bath upstairs Alternate Level Stairs-Number of  Steps: full flight Alternate Level Stairs-Rails: Left Bathroom Shower/Tub: Producer, television/film/video: Handicapped height     Home Equipment: None   Additional Comments: typically uses tub shower in her room. Could potentially have access to walkin shower with built in seat in brother's bathroom.      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        OT Problem List: Decreased strength;Decreased range of motion;Decreased activity tolerance;Impaired balance (sitting and/or standing);Decreased coordination;Decreased cognition;Decreased safety awareness;Decreased knowledge of use of DME or AE;Decreased knowledge of precautions;Impaired tone;Impaired sensation;Impaired UE functional use;Pain      OT Treatment/Interventions: Self-care/ADL training;Therapeutic exercise;Neuromuscular education;DME and/or AE instruction;Therapeutic activities;Balance training;Patient/family education;Cognitive remediation/compensation    OT Goals(Current goals can be found in the care plan section) Acute Rehab OT Goals Patient Stated Goal: regain independence, return to work OT Goal Formulation: With patient Time For Goal Achievement: 10/27/20 Potential to Achieve Goals: Good ADL Goals Pt Will Perform Eating: with modified independence;sitting Pt Will Perform Upper Body Dressing: with modified independence;sitting Pt Will Perform Lower Body Dressing: with modified independence;sit to/from stand Pt Will Transfer to Toilet: with modified independence;ambulating Pt Will Perform Toileting - Clothing Manipulation and hygiene: with modified independence;sitting/lateral leans;sit to/from stand Pt/caregiver will Perform Home Exercise Program: Increased ROM;Increased strength;Left upper extremity;With written HEP provided;Independently Additional ADL Goal #1: Pt will compete bed mobility at mod I level to prepare for EOB/OOB ADLs.  OT  Frequency: Min 3X/week   Barriers to D/C:  Co-evaluation              AM-PAC OT "6 Clicks" Daily Activity     Outcome Measure Help from another person eating meals?: A Little Help from another person taking care of personal grooming?: A Lot Help from another person toileting, which includes using toliet, bedpan, or urinal?: A Little Help from another person bathing (including washing, rinsing, drying)?: A Lot Help from another person to put on and taking off regular upper body clothing?: A Lot Help from another person to put on and taking off regular lower body clothing?: A Little 6 Click Score: 15   End of Session Equipment Utilized During Treatment: Other (comment) (single extremity support sought OOB)  Activity Tolerance: Patient tolerated treatment well Patient left: in chair;with call bell/phone within reach;with chair alarm set;Other (comment) (with Pharmacy)  OT Visit Diagnosis: Unsteadiness on feet (R26.81);Other symptoms and signs involving cognitive function;Hemiplegia and hemiparesis;Other symptoms and signs involving the nervous system (R29.898) Hemiplegia - Right/Left: Left Hemiplegia - dominant/non-dominant: Non-Dominant Hemiplegia - caused by:  (workup still underway. scans - for acute CVA)                Time: 1229-1305 OT Time Calculation (min): 36 min Charges:  OT General Charges $OT Visit: 1 Visit OT Evaluation $OT Eval Moderate Complexity: 1 Mod OT Treatments $Self Care/Home Management : 8-22 mins  Raynald Kemp, OT Acute Rehabilitation Services Pager: [phone] Office: [phone]   Pilar Grammes 10/13/2020, 1:41 PM

## 2020-10-13 NOTE — Progress Notes (Signed)
STROKE TEAM PROGRESS NOTE   INTERVAL HISTORY Pt is evaluated at the bedside. No family present in the room.  Pt states her weakness in Lt leg has improved since admission but Left hand is still weak. She has previous stroke in 2018 following which she was not taking any aspirin or plavix as she felt she wanted to try herbal medications.  . She denies any smoking, alcohol use and substance use. Talked to Pt about the possibility of Obstructive sleep apnea and need for Sleep study. Pt states she will think about it.On Neuro exam, she is alert and oriented x4. Mild Ptosis of Lt eye (from last stroke), weakness in Left hand and Left leg observed.   Her BP has been running high with systolic of 163 to 194 and diastolic of 40-84 mmHg. She has been Afebrile. HBA1c pending, LDL high at 144, Cholesterol 209. HBA1c is pending. U tox negative. CT Head showed chronic small-vessel infarct within the left basal ganglia and internal capsule. MRI showed Area of restricted diffusion involving the posterior limb of internal capsule on the right extending into the corona radiata, consistent with acute/subacute infarct and Remote infarct in the left basal ganglia. PT/OT Eval pending. ECHO pending. CTA Head and Neck Pending.   Pt on DAPT (Aspirin and Plavix). Recommend continuing Aspirin and Plavix for 3 weeks and then Aspirin alone.   Vitals:   10/12/20 2227 10/12/20 2337 10/13/20 0401 10/13/20 0740  BP: (!) 175/75 (!) 163/74 (!) 167/66 (!) 173/74  Pulse: 62 (!) 53 64 60  Resp: 18 18 18 18   Temp: 98.4 F (36.9 C) 98.1 F (36.7 C) 98.2 F (36.8 C) 98.5 F (36.9 C)  TempSrc: Oral Oral Oral Oral  SpO2: 100% 97% 98% 97%  Weight:      Height:       CBC:  Recent Labs  Lab 10/12/20 1009 10/12/20 1841  WBC 6.4 6.0  NEUTROABS 4.9  --   HGB 13.4 12.4  HCT 41.5 39.0  MCV 81.9 82.1  PLT 168 155   Basic Metabolic Panel:  Recent Labs  Lab 10/12/20 1009 10/12/20 1841 10/13/20 0348  NA 138  --  139  K 3.2*  --   3.9  CL 101  --  105  CO2 26  --  24  GLUCOSE 126*  --  104*  BUN 8  --  6  CREATININE 0.73 0.76 0.75  CALCIUM 10.1  --  9.8   Lipid Panel:  Recent Labs  Lab 10/13/20 0348  CHOL 209*  TRIG 66  HDL 52  CHOLHDL 4.0  VLDL 13  LDLCALC 782144*   HgbA1c: No results for input(s): HGBA1C in the last 168 hours. Urine Drug Screen:  Recent Labs  Lab 10/12/20 0955  LABOPIA NONE DETECTED  COCAINSCRNUR NONE DETECTED  LABBENZ NONE DETECTED  AMPHETMU NONE DETECTED  THCU NONE DETECTED  LABBARB NONE DETECTED    Alcohol Level  Recent Labs  Lab 10/12/20 1010  ETH <10    IMAGING past 24 hours MR Brain Wo Contrast (neuro protocol)  Addendum Date: 10/12/2020   ADDENDUM REPORT: 10/12/2020 15:32 ADDENDUM: These results were called by telephone on 10/12/2020 at 3:30 pm to provider Nanatali , who verbally acknowledged these results. Electronically Signed   By: Baldemar LenisKatyucia  De Macedo Rodrigues M.D.   On: 10/12/2020 15:32   Result Date: 10/12/2020 CLINICAL DATA:  Neuro deficit, acute, stroke suspected. EXAM: MRI HEAD WITHOUT CONTRAST TECHNIQUE: Multiplanar, multiecho pulse sequences of the brain and surrounding  structures were obtained without intravenous contrast. COMPARISON:  Head CT October 12, 2020 FINDINGS: Brain: Area of restricted diffusion involving the posterior limb of internal capsule on the right extending into the corona radiata, consistent with acute/subacute infarct. Remote infarct is seen in the left basal ganglia region and left side of the pons. Scattered confluent foci of T2 hyperintensity are seen within the white matter of the cerebral hemispheres, nonspecific, more pronounced than expected for patient's age. No hemorrhage, hydrocephalus, extra-axial collection or mass lesion. Vascular: Normal flow voids. Skull and upper cervical spine: Normal marrow signal. Sinuses/Orbits: Mucous retention cyst in the left maxillary sinus. The orbits are maintained. IMPRESSION: 1. Area of restricted  diffusion involving the posterior limb of internal capsule on the right extending into the corona radiata, consistent with acute/subacute infarct. 2. Remote infarct in the left basal ganglia region and left side of the pons. 3. Scattered confluent foci of T2 hyperintensity are seen within the white matter of the cerebral hemispheres, nonspecific, more pronounced than expected for patient's age. Findings may represent chronic microvascular ischemic changes. Electronically Signed: By: Baldemar Lenis M.D. On: 10/12/2020 15:22    PHYSICAL EXAM GENERAL: Pleasant middle-age obese African-American lady awake, alert in NAD HEENT: - Normocephalic and atraumatic LUNGS - Symmetrical chest rise, No labored breathing noted CV - no JVD, No Peripheral Edema ABDOMEN - Soft,  nondistended  Ext: warm, well perfused, intact peripheral pulses, no Peripheral edema.  NEURO Exam:   Mental Status: AA&Ox4.  Oriented to self, age, place, situation. Good Attention and Concentration Language: speech is fluent.  fluency, and comprehension intact. Cranial Nerves:   CN II Pupils equal and reactive to light, no VF deficits    CN III,IV,VI EOM intact, no gaze preference or deviation, no nystagmus    CN V normal sensation in V1, V2, and V3 segments bilaterally    CN VII Mild Left facial droop, no nasolabial fold flattening Mild ptosis in left eye (from previous stroke)    CN VIII normal hearing to speech    CN IX & X normal palatal elevation, no uvular deviation    CN XI 5/5 head turn and 5/5 shoulder shrug bilaterally    CN XII midline tongue protrusion    Motor: Drift in Left Upper Extremity, No Drift in LE's. Strength 5/5 in Rt UE, 4/5 in Lt UE, Strength in Rt LE  5/5, Lt LE 4+/5.  Weakness of left grip and intrinsic hand muscles.  Orbits right over left upper extremity. Planters- Down going Tone: is normal and bulk is normal. Sensation- Intact to light touch bilaterally. Coordination: FTN intact  bilaterally, no ataxia. Gait- deferred   ASSESSMENT/PLAN [name] is a 55 y.o. female with history of  previous stroke, diabetes and hypertension which are untreated due to the patient's pursuing a naturopathic approach. She presented with some mild left leg weakness which worsened overnight and included her arm and this prompted her to seek care. CT Head showed chronic small-vessel infarct within the left basal ganglia and internal capsule. MRI showed Area of restricted diffusion involving the posterior limb of internal capsule on the right extending into the corona radiata, consistent with acute/subacute infarct and Remote infarct in the left basal ganglia. PT/OT Eval pending. ECHO pending. CTA Head and Neck Pending.   Pt was given loading dose of Aspirin and Plavix and then started on DAPT( Aspirin 81 mg and Plavix 75 mg). Recommend continuing Aspirin and Plavix for 3 weeks and then Aspirin alone.  Stroke Acute/subacute infarct of posterior limb of internal capsule on the right extending into the corona radiata, Etiology most likely secondary to small vessel disease due to risk factors of HTN, obesity, at risk for sleep apnea and DM.    CT Code Stroke- No acute intracranial abnormality. Chronic small-vessel infarct within the left basal ganglia and        internal capsule. Chronic right basal ganglia lacunar infarct. Advanced cerebral white matter disease, nonspecific but               most often secondary to chronic small vessel ischemia. Partially empty sella turcica. This finding is very commonly        incidental, but can be associated with idiopathic intracranial hypertension.   CTA head- Ordered, Pending  CTA neck -Ordered, Pending  CT perfusion - Not ordered.  MRI - Area of restricted diffusion involving the posterior limb of  internal capsule on the right extending into the corona radiata, consistent with acute/subacute infarct. Remote infarct in the left basal  ganglia region and left side of the pons. chronic microvascular ischemic changes.  MRA  Not ordered  Carotid Doppler  Not ordered  2D Echo - Scheduled, Pending  LDL 144  HgbA1c No results found for requested labs within last 69678 hours.  VTE prophylaxis - Lovenox    Diet   Diet regular Room service appropriate? Yes; Fluid consistency: Thin    No antithrombotic prior to admission, now on aspirin 81 mg daily and clopidogrel 75 mg daily. (Continue DAPT for 3 weeks and then Aspirin alone)  Therapy recommendations:  Pending  Disposition:  TBD  Hypertension  Home meds:  None  Unstable . Permissive hypertension (OK if < 220/120) but gradually normalize in 5-7 days . Long-term BP goal normotensive  Hyperlipidemia  Home meds:  None,   LDL 144, goal < 70  Cholesterol 209, HDL 52  Rec statin at discharge  Diabetes type II Controlled  Home meds:  None  HgbA1c No results found for requested labs within last 93810 hours., goal < 7.0  CBGs Recent Labs    10/12/20 1147 10/13/20 0047  GLUCAP 85 97     Obstructive sleep Apnea?  Talked to Pt about the possibility of Obstructive sleep apnea and need for Sleep study.  Recommend Sleep Study on outpatient basis  Other Stroke Risk Factors   Obesity, Body mass index is 51.02 kg/m., BMI >30 associated with increased stroke risk, recommend weight loss, diet and exercise as appropriate.    Hx stroke in 2018- Not on Aspirin and Plavix. Taking Herbal supplements instead.   Family hx stroke (Grandmother)   Hospital day # 1 I have personally obtained history,examined this patient, reviewed notes, independently viewed imaging studies, participated in medical decision making and plan of care.ROS completed by me personally and pertinent positives fully documented  I have made any additions or clarifications directly to the above note. Agree with note above.  She presented with left leg weakness due to right basal ganglia  lacunar infarct likely due to small vessel disease.  Recommend aspirin and Plavix for 3 weeks followed by aspirin alone and aggressive risk factor modification.  Check CT angiograms.  Consider outpatient polysomnogram for sleep apnea.  Patient counseled to be compliant with medications to have outpatient follow-up for stroke prevention.  Greater than 50% time during the 35-minute visit was spent on counseling and coordination of care about her lacunar stroke and stroke prevention treatment and answering questions.  Delia Heady,  MD Medical Director [facility] Pager: [phone] 10/13/2020 1:12 PM    To contact Stroke Continuity provider, please refer to [email or web address]. After hours, contact General Neurology

## 2020-10-13 NOTE — TOC Initial Note (Signed)
Transition of Care [facility]) - Initial/Assessment Note    Patient Details  Name: Shannon Garza MRN: [id number] Date of Birth: 10/26/1965  Transition of Care [facility]) CM/SW Contact:    Kermit Balo, RN Phone Number: 10/13/2020, 2:37 PM  Clinical Narrative:                 Pt lives at home with her sister. Pt denies transportation or medications issues at home.  Pt is without a PCP.  Recommendations are for CIR. CM following.   Expected Discharge Plan: IP Rehab Facility Barriers to Discharge: Continued Medical Work up   Patient Goals and CMS Choice     Choice offered to / list presented to : Patient  Expected Discharge Plan and Services Expected Discharge Plan: IP Rehab Facility   Discharge Planning Services: CM Consult Post Acute Care Choice: IP Rehab Living arrangements for the past 2 months: Single Family Home                                      Prior Living Arrangements/Services Living arrangements for the past 2 months: Single Family Home Lives with:: Siblings Patient language and need for interpreter reviewed:: Yes Do you feel safe going back to the place where you live?: Yes            Criminal Activity/Legal Involvement Pertinent to Current Situation/Hospitalization: No - Comment as needed  Activities of Daily Living Home Assistive Devices/Equipment: None ADL Screening (condition at time of admission) Patient's cognitive ability adequate to safely complete daily activities?: Yes Is the patient deaf or have difficulty hearing?: No Does the patient have difficulty seeing, even when wearing glasses/contacts?: No Does the patient have difficulty concentrating, remembering, or making decisions?: Yes Patient able to express need for assistance with ADLs?: Yes Does the patient have difficulty dressing or bathing?: Yes Independently performs ADLs?: No Communication: Independent Dressing (OT): Needs assistance Is this a change from baseline?: Change from  baseline, expected to last <3days Grooming: Independent Feeding: Independent Bathing: Needs assistance Is this a change from baseline?: Change from baseline, expected to last <3 days Toileting: Needs assistance Is this a change from baseline?: Change from baseline, expected to last <3 days In/Out Bed: Needs assistance Is this a change from baseline?: Change from baseline, expected to last <3 days Walks in Home: Independent Does the patient have difficulty walking or climbing stairs?: Yes Weakness of Legs: Left Weakness of Arms/Hands: Left  Permission Sought/Granted                  Emotional Assessment Appearance:: Appears stated age Attitude/Demeanor/Rapport: Engaged Affect (typically observed): Accepting Orientation: : Oriented to Self,Oriented to Place,Oriented to  Time,Oriented to Situation   Psych Involvement: No (comment)  Admission diagnosis:  Primary hypertension [I10] CVA (cerebral vascular accident) (HCC) [I63.9] Left leg weakness [R29.898] Left arm weakness [R29.898] Patient Active Problem List   Diagnosis Date Noted  . Left arm weakness   . Left leg weakness   . CVA (cerebral vascular accident) (HCC) 10/12/2020   PCP:  Patient, No Pcp Per Pharmacy:  No Pharmacies Listed    Social Determinants of Health (SDOH) Interventions    Readmission Risk Interventions No flowsheet data found.

## 2020-10-13 NOTE — Plan of Care (Signed)
  Problem: Education: Goal: Knowledge of General Education information will improve Description Including pain rating scale, medication(s)/side effects and non-pharmacologic comfort measures Outcome: Progressing   Problem: Health Behavior/Discharge Planning: Goal: Ability to manage health-related needs will improve Outcome: Progressing   Problem: Clinical Measurements: Goal: Ability to maintain clinical measurements within normal limits will improve Outcome: Progressing Goal: Will remain free from infection Outcome: Progressing Goal: Diagnostic test results will improve Outcome: Progressing Goal: Respiratory complications will improve Outcome: Progressing Goal: Cardiovascular complication will be avoided Outcome: Progressing   Problem: Activity: Goal: Risk for activity intolerance will decrease Outcome: Progressing   Problem: Nutrition: Goal: Adequate nutrition will be maintained Outcome: Progressing   Problem: Coping: Goal: Level of anxiety will decrease Outcome: Progressing   Problem: Elimination: Goal: Will not experience complications related to bowel motility Outcome: Progressing Goal: Will not experience complications related to urinary retention Outcome: Progressing   Problem: Pain Managment: Goal: General experience of comfort will improve Outcome: Progressing   Problem: Safety: Goal: Ability to remain free from injury will improve Outcome: Progressing   Problem: Skin Integrity: Goal: Risk for impaired skin integrity will decrease Outcome: Progressing   Problem: Education: Goal: Knowledge of disease or condition will improve Outcome: Progressing Goal: Knowledge of secondary prevention will improve Outcome: Progressing Goal: Knowledge of patient specific risk factors addressed and post discharge goals established will improve Outcome: Progressing Goal: Individualized Educational Video(s) Outcome: Progressing   Problem: Coping: Goal: Will verbalize  positive feelings about self Outcome: Progressing Goal: Will identify appropriate support needs Outcome: Progressing   Problem: Health Behavior/Discharge Planning: Goal: Ability to manage health-related needs will improve Outcome: Progressing   

## 2020-10-13 NOTE — Evaluation (Signed)
Physical Therapy Evaluation Patient Details Name: Shannon Garza MRN: [id number] DOB: [birth year] Today's Date: 10/13/2020   History of Present Illness  Pt is a 55 y.o. female who presents with acute L-sided limb and facial weakness. MRI revealed acute/subacute infarct of R posterior internal capsule and remote infarct of L basal ganglia and L pons. Outside window for tPA. PMH: CVA 4 years ago, HTN, and DM2.    Clinical Impression  Pt admitted with the above diagnoses and presents with below problem list. PTA, pt was independent with all functional mobility without use of AD/AE and worked from home utilizing a computer. She lives with her siblings on the second floor of the house. Pt presents with L hip flexor weakness compared to her R, L leg dysdiadochokinesia and dysmetria, impaired balance, and impaired cognition impacting her safety and independence with all functional mobility. Pt currently needs up to modA for bed mobility, minA for transfers, and min guard-maxA with short bedroom distance gait bouts with a RW depending on her stability. See General Gait Details below in regards to pt's controlled fall during session this date. Given age, good family support, and baseline independence with functional mobility feel she would benefit from intensive rehab program. Will continue to follow acutely. Pt would benefit from +2 personnel for safety as pt had sudden LOB and controlled fall towards L when trying to turn R this date.    Follow Up Recommendations CIR;Supervision for mobility/OOB    Equipment Recommendations  Rolling walker with 5" wheels;3in1 (PT)    Recommendations for Other Services Rehab consult     Precautions / Restrictions Precautions Precautions: Fall Restrictions Weight Bearing Restrictions: No Other Position/Activity Restrictions: acute LUE weakness      Mobility  Bed Mobility Overal bed mobility: Needs Assistance Bed Mobility: Rolling;Sidelying to Sit Rolling: Min  guard (partial roll to come to EOB) Sidelying to sit: Mod assist       General bed mobility comments: Pt rolled to L with min guard and use of bed rail. Cued pt to bring L elbow under trunk to push up to ascend trunk, modA to complete as poor initiation noted by L arm. Bed flat during all bed mob.    Transfers Overall transfer level: Needs assistance Equipment used: Rolling walker (2 wheeled) Transfers: Sit to/from Stand Sit to Stand: Min assist Stand pivot transfers: Min assist       General transfer comment: Cued pt to push up from bed and transition hands to RW, minA for steadying and extra time to power up to stand. No overt LOB.  Ambulation/Gait Ambulation/Gait assistance: Min assist;Max assist Gait Distance (Feet): 15 Feet Assistive device: Rolling walker (2 wheeled) Gait Pattern/deviations: Step-through pattern;Decreased step length - right;Decreased step length - left;Decreased stride length;Staggering left Gait velocity: reduced Gait velocity interpretation: <1.31 ft/sec, indicative of household ambulator General Gait Details: Initially, pt ambulating slowly but steady and no knee buckling or LOB with min guard-A towards door of room using RW. PT guarding pt posterior to pt with hands on gait belt throughout. When pt was cued to turn around towards the R to return back towards chair pt became quiet and then suddenly staggered to the L. PT unable to fully recover pt's balance with maxA and thus PT controlled descent with maxA and with use of gait belt as pt slid her back down the bathroom door until she was sitting on the ground. RNs and NT called to assist pt off ground back to chair, again only needing minA  to ambulate another ~15 ft back to chair without LOB this time. RN assess pt with BP being high but RN reported it had been like that all day and was still within set normal parameters at this time. Repeatedly asked pt if she hit her head or sustained any injuries, in which she  denied all. Initially she did report her L pinky finger hurt a little though.  Stairs            Wheelchair Mobility    Modified Rankin (Stroke Patients Only) Modified Rankin (Stroke Patients Only) Pre-Morbid Rankin Score: No symptoms Modified Rankin: Moderately severe disability     Balance Overall balance assessment: Needs assistance Sitting-balance support: Feet supported Sitting balance-Leahy Scale: Fair Sitting balance - Comments: Static sitting EOB, supervision for safety.   Standing balance support: Bilateral upper extremity supported;During functional activity Standing balance-Leahy Scale: Poor Standing balance comment: Reliant on UE support and min guard-A majority of short gait with RW, but sustaining a sudden controlled fall towards the L this date when trying to turn.                             Pertinent Vitals/Pain Pain Assessment: No/denies pain    Home Living Family/patient expects to be discharged to:: Private residence Living Arrangements: Other relatives (brother, sister, and sister-in-law) Available Help at Discharge: Family;Available 24 hours/day (Sister) Type of Home: House Home Access: Stairs to enter Entrance Stairs-Rails: Right;Left;Can reach both Entrance Stairs-Number of Steps: 2 Home Layout: Multi-level;Bed/bath upstairs Home Equipment: Hand held shower head;Shower seat - built in Additional Comments: typically uses tub shower in her room. Could potentially have access to walkin shower with built in seat in brother's bathroom.    Prior Function Level of Independence: Independent         Comments: works from home at a computer. Pt drives.     Hand Dominance   Dominant Hand: Right    Extremity/Trunk Assessment   Upper Extremity Assessment Upper Extremity Assessment: Defer to OT evaluation LUE Deficits / Details: 3-/5 shoulder and distal. benefits from multimodal cues. Impaired gross and fine motor coordination.  Impaired proprioception (vs decreased control of movements d/t weakness?) LUE Sensation: decreased proprioception;decreased light touch LUE Coordination: decreased fine motor;decreased gross motor    Lower Extremity Assessment Lower Extremity Assessment: LLE deficits/detail LLE Deficits / Details: Slight weakness in L hip flexor with MMT score of 4- compared to 4+ to 5 grossly throughout remaining L leg and grossly throughout R leg LLE Sensation: WNL LLE Coordination: decreased fine motor;decreased gross motor (dysdiadochokinesia and dysmetria noted)    Cervical / Trunk Assessment Cervical / Trunk Assessment: Normal  Communication   Communication: Other (comment);No difficulties (mild slurred speech at times)  Cognition Arousal/Alertness: Awake/alert Behavior During Therapy: [facility] for tasks assessed/performed;Impulsive Overall Cognitive Status: No family/caregiver present to determine baseline cognitive functioning                                 General Comments: Pt able to overall answer questions appropriately, A&Ox4. Question her insight into current deficits as pt had sudden LOB towards the L when trying to turn around to return to chair. Pt reported she was thought her L knee "gave" out under her, causing the fall. When cued to sit up once pt sitting on floor with controlled descent provided by PT she then impulsively leaned posteriorly 2x and  needed cues and assistance to return to upright position and maintain it.      General Comments General comments (skin integrity, edema, etc.): PT manager, RN, and MD made aware of fall this date.    Exercises Other Exercises Other Exercises: educated on AAROM exercises for LUE using RUE to complete full range at each level.   Assessment/Plan    PT Assessment Patient needs continued PT services  PT Problem List Decreased strength;Decreased activity tolerance;Decreased balance;Decreased mobility;Decreased  coordination;Decreased cognition;Decreased knowledge of use of DME;Decreased safety awareness;Decreased knowledge of precautions;Obesity       PT Treatment Interventions DME instruction;Gait training;Stair training;Functional mobility training;Therapeutic activities;Therapeutic exercise;Balance training;Neuromuscular re-education;Cognitive remediation;Patient/family education    PT Goals (Current goals can be found in the Care Plan section)  Acute Rehab PT Goals Patient Stated Goal: regain independence, return to work PT Goal Formulation: With patient Time For Goal Achievement: 10/27/20 Potential to Achieve Goals: Good    Frequency Min 4X/week   Barriers to discharge        Co-evaluation               AM-PAC PT "6 Clicks" Mobility  Outcome Measure Help needed turning from your back to your side while in a flat bed without using bedrails?: A Little Help needed moving from lying on your back to sitting on the side of a flat bed without using bedrails?: A Lot Help needed moving to and from a bed to a chair (including a wheelchair)?: A Little Help needed standing up from a chair using your arms (e.g., wheelchair or bedside chair)?: A Little Help needed to walk in hospital room?: A Lot Help needed climbing 3-5 steps with a railing? : A Lot 6 Click Score: 15    End of Session Equipment Utilized During Treatment: Gait belt Activity Tolerance: Patient tolerated treatment well;Other (comment) (limited by safety concerns with fall) Patient left: in chair;with call bell/phone within reach;with chair alarm set;with nursing/sitter in room Nurse Communication: Mobility status;Other (comment);Precautions (fall and pt denying injuries except initial L pinky finfer hurting a little) PT Visit Diagnosis: Unsteadiness on feet (R26.81);Other abnormalities of gait and mobility (R26.89);Muscle weakness (generalized) (M62.81);Difficulty in walking, not elsewhere classified (R26.2);Other symptoms  and signs involving the nervous system (R29.898);Hemiplegia and hemiparesis Hemiplegia - Right/Left: Left Hemiplegia - dominant/non-dominant: Non-dominant Hemiplegia - caused by: Cerebral infarction    Time: 4944-9675 PT Time Calculation (min) (ACUTE ONLY): 29 min   Charges:   PT Evaluation $PT Eval Moderate Complexity: 1 Mod PT Treatments $Therapeutic Activity: 8-22 mins        Raymond Gurney, PT, DPT Acute Rehabilitation Services  Pager: [phone] Office: [phone]   Jewel Baize 10/13/2020, 4:57 PM

## 2020-10-13 NOTE — Evaluation (Signed)
Speech Language Pathology Evaluation Patient Details Name: Shannon Garza MRN: [id number] DOB: December 26, 1965 Today's Date: 10/13/2020 Time: 8546-2703 SLP Time Calculation (min) (ACUTE ONLY): 23 min  Problem List:  Patient Active Problem List   Diagnosis Date Noted  . Left arm weakness   . Left leg weakness   . CVA (cerebral vascular accident) (HCC) 10/12/2020   Past Medical History:  Past Medical History:  Diagnosis Date  . Diabetes mellitus without complication (HCC)   . Hypertension    Past Surgical History: History reviewed. No pertinent surgical history. HPI:  Pt is a 55 y.o. female with PMH is significant for history of CVA, DM, HTN who presented with left-sided weakness. MRI brain 2/15: restricted diffusion involving the posterior limb of internal capsule on the right extending into the corona radiata, consistent with acute/subacute infarct.   Assessment / Plan / Recommendation Clinical Impression  Pt participated in speech/language/cognition evaluation. Pt reported that she works full time and has some baseline dififculty with memory. She denied any other impairments in the aforementioned areas or any acute changes. The [facility] Mental Status Examination was completed to evaluate the pt's cognitive-linguistic skills. She  achieved a score of 20/30 which is below the normal limits of 27 or more out of 30. She exhibited difficulty in the areas of awareness, memory, and executive function. Pt questions whether these noted impairments are acute, but no family was present to attest to her current performance today compared to baseline. Her speech and language skills were WNL. Skilled SLP services will be initiated at this time to improve cognitive-linguistic function.    SLP Assessment  SLP Recommendation/Assessment: Patient needs continued Speech Lanaguage Pathology Services SLP Visit Diagnosis: Cognitive communication deficit (R41.841)    Follow Up Recommendations   Inpatient Rehab    Frequency and Duration min 2x/week  2 weeks      SLP Evaluation Cognition  Overall Cognitive Status: No family/caregiver present to determine baseline cognitive functioning Arousal/Alertness: Awake/alert Orientation Level: Oriented X4 Attention: Focused;Sustained Focused Attention: Appears intact Sustained Attention: Appears intact Memory: Impaired Memory Impairment: Retrieval deficit;Storage deficit;Decreased recall of new information (Immediate: 5/5; delayed: 4/5; with cue: 1/1) Awareness: Impaired Awareness Impairment: Emergent impairment Problem Solving: Impaired Problem Solving Impairment: Verbal complex Executive Function: Reasoning;Sequencing;Organizing Reasoning: Appears intact Sequencing: Impaired Sequencing Impairment: Verbal complex (clockdrawing: 0/4) Organizing Impairment: Verbal complex (backward digit span: 0/2)       Comprehension  Auditory Comprehension Overall Auditory Comprehension: Appears within functional limits for tasks assessed Yes/No Questions: Within Functional Limits Commands: Within Functional Limits Conversation: Complex Visual Recognition/Discrimination Discrimination: Within Function Limits Reading Comprehension Reading Status: Within funtional limits    Expression Expression Primary Mode of Expression: Verbal Verbal Expression Overall Verbal Expression: Appears within functional limits for tasks assessed Initiation: No impairment Level of Generative/Spontaneous Verbalization: Conversation Repetition: No impairment Naming: No impairment Pragmatics: No impairment Written Expression Dominant Hand: Right   Oral / Motor  Oral Motor/Sensory Function Overall Oral Motor/Sensory Function: Within functional limits Motor Speech Overall Motor Speech: Appears within functional limits for tasks assessed Respiration: Within functional limits Phonation: Normal Resonance: Within functional limits Articulation: Within  functional limitis Intelligibility: Intelligible Motor Planning: Witnin functional limits Motor Speech Errors: Not applicable   Shannon Rougeau I. Vear Clock, MS, CCC-SLP Acute Rehabilitation Services Office number [phone] Pager [phone]                    Shannon Garza 10/13/2020, 5:50 PM

## 2020-10-13 NOTE — Progress Notes (Signed)
Family Medicine Teaching Service Daily Progress Note Intern Pager: [phone]  Patient name: Shannon Garza Medical record number: [id number] Date of birth: November 11, 1965 Age: 55 y.o. Gender: female  Primary Care Provider: Patient, No Pcp Per Consultants: Neuro  Code Status: Full  Pt Overview and Major Events to Date:  Admitted 2/15  Assessment and Plan: Shannon Garza is a 55 y.o. female presenting with acute CVA. PMH is significant for history of CVA (2018), DM, HTN.  Acute CVA  Presents with left LL and left UL weakness. PMH of CVA 4 yrs ago in which right side and speech were affected. She did not take medication after that stroke, and instead tried herbal remedies.  MRI brain showed area of restricted diffusion involving the posterior limb of internal capsule on the right extending into the corona radiata, consistent with acute/subacute infarct. CT head showed: Partially empty sella turcica. Stroke likely due to long term hypertension Neuro and stroke team following, appreciate recommendations  - Neuro following:  - reccomends dual antiplatelet thrapy for 3 weeks followed by monotherapy:  aspirin 81 mg and Plavix 75  mg daily after 300 mg load, followed by aspirin alone  - f/u echo   - telemetry monitoring   - frequent neuro checks  - fall precautions  - PT, OT, speech consults   Hypertension Systolic ranging 694-854. 173/74 this am.  Reports taking Lisinopril in the past but stopped taking it because the way it made her feel.  This is also likely elevated in the setting of acute stroke.  Permissive hypertension for 48 hours after symptoms, which ends this morning, unless otherwise recommended by neurology. -Labetalol as needed ordered - patient agreed to restarting BP med. Losartan 25mg .   Diabetes Currently diet controlled at home. Used to take Metformin but PCP took her off of this. CBG this am 97 - A1c pending  -Blood glucose on morning labs  Hyperlipidemia  Total  cholesterol 209, LDL 144 - consider starting high intensity statin    FEN/GI: Clear liquid diet ordered, she passed the bedside swallow Prophylaxis: Lovenox  Disposition:  Consider CIR placement in next 1-2 days   Subjective:  Patient doing well. Endorses her last stroke in 2018 having worse symptoms including speech defecits which she does not currently have. States she stopped taking Lisinopril because the way it made her feel, and her Metformin was stopped by her PCP. She is asking when her left arm/hand weakness will subside.   Discussed preventative measures such as diet and patient endorsed a Vegan diet, as well as discussed exercising and starting on medications to help prevent another stroke. Patient expressed understanding and willingness to restart medication.    Objective: Temp:  [98.1 F (36.7 C)-98.6 F (37 C)] 98.2 F (36.8 C) (02/16 0401) Pulse Rate:  [53-69] 64 (02/16 0401) Resp:  [16-20] 18 (02/16 0401) BP: (163-194)/(40-84) 167/66 (02/16 0401) SpO2:  [97 %-100 %] 98 % (02/16 0401) Weight:  [130.6 kg] 130.6 kg (02/15 0924) Physical Exam: General: well appearing, NAD Cardiovascular: RRR. 2/6 systolic murmur in RUSB Respiratory: CTAB. Normal work of breathing Abdomen: soft, non distended, non tender Extremities: warm, well perfused, no edema  Neuro: CN VII deficit with slight left sided facial droop. Other CNs intact  4/5 strength in L upper and lower extremities. Reduced grip strength in L hand  Sensation slightly reduced on left side of face. Sensation of extremities equal bilaterally   Laboratory: Recent Labs  Lab 10/12/20 1009 10/12/20 1841  WBC 6.4 6.0  HGB 13.4 12.4  HCT 41.5 39.0  PLT 168 155   Recent Labs  Lab 10/12/20 1009 10/12/20 1841  NA 138  --   K 3.2*  --   CL 101  --   CO2 26  --   BUN 8  --   CREATININE 0.73 0.76  CALCIUM 10.1  --   PROT 7.4  --   BILITOT 0.6  --   ALKPHOS 68  --   ALT 13  --   AST 25  --   GLUCOSE 126*  --       Imaging/Diagnostic Tests: CT ANGIO NECK W OR WO CONTRAST  Result Date: 10/13/2020 CLINICAL DATA:  Acute infarct on MRI EXAM: CT ANGIOGRAPHY HEAD AND NECK TECHNIQUE: Multidetector CT imaging of the head and neck was performed using the standard protocol during bolus administration of intravenous contrast. Multiplanar CT image reconstructions and MIPs were obtained to evaluate the vascular anatomy. Carotid stenosis measurements (when applicable) are obtained utilizing NASCET criteria, using the distal internal carotid diameter as the denominator. CONTRAST:  1mL OMNIPAQUE IOHEXOL 350 MG/ML SOLN COMPARISON:  10/12/2020 FINDINGS: CT HEAD Brain: There is no acute intracranial hemorrhage or mass effect. There is subtle low-attenuation in the right corona radiata corresponding to infarction on MRI. Chronic infarct of the left basal ganglia and adjacent white matter. Additional patchy and confluent areas of hypoattenuation in the supratentorial white likely reflect age advanced chronic microvascular ischemic changes. Ventricles are stable size. There is no extra-axial fluid collection. Vascular: No new findings. Skull: Calvarium is unremarkable. Sinuses/Orbits: No acute finding. Other: None. Review of the MIP images confirms the above findings CTA NECK Aortic arch: Great vessel origins are patent. Right carotid system: Patent. No measurable stenosis at the ICA origin. Left carotid system: Patent. No measurable stenosis at the ICA origin. Vertebral arteries: Patent and codominant. Skeleton: Mild cervical spine degenerative changes. Other neck: No significant abnormality. Upper chest: No apical lung mass. Review of the MIP images confirms the above findings CTA HEAD Anterior circulation: Intracranial internal carotid arteries patent with atherosclerotic irregularity primarily along the cavernous segments, right greater left. Irregular noncalcified plaque is present the floor of the posterior genu. There is a 3 x 2  mm inferiorly directed aneurysm the distal supraclinoid right ICA. Anterior cerebral arteries are patent. Hypoplastic right A1 ACA. Middle cerebral arteries are patent. There is mild atherosclerotic irregularity of distal branches. Posterior circulation: Intracranial vertebral arteries, basilar artery, and posterior cerebral arteries are patent. Venous sinuses: Patent as allowed by contrast bolus timing. Review of the MIP images confirms the above findings IMPRESSION: No acute intracranial hemorrhage. Evolving acute infarction of the corona radiata better seen on MRI. No hemodynamically significant stenosis in the neck. Right greater than left intracranial internal carotid atherosclerosis. Irregular noncalcified plaque the floor of the posterior genu. Mild atherosclerotic irregularity of distal MCA branches bilaterally. 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 Electronically Signed   By: Guadlupe Spanish M.D.   On: 10/13/2020 14:05   MR Brain Wo Contrast (neuro protocol)  Addendum Date: 10/12/2020   ADDENDUM REPORT: 10/12/2020 15:32 ADDENDUM: These results were called by telephone on 10/12/2020 at 3:30 pm to provider [facility] , who verbally acknowledged these results. Electronically Signed   By: Baldemar Lenis M.D.   On: 10/12/2020 15:32   Result Date: 10/12/2020 CLINICAL DATA:  Neuro deficit, acute, stroke suspected. EXAM: MRI HEAD WITHOUT CONTRAST TECHNIQUE: Multiplanar, multiecho pulse sequences of the brain and surrounding  structures were obtained without intravenous contrast. COMPARISON:  Head CT October 12, 2020 FINDINGS: Brain: Area of restricted diffusion involving the posterior limb of internal capsule on the right extending into the corona radiata, consistent with acute/subacute infarct. Remote infarct is seen in the left basal ganglia region and left side of the pons. Scattered confluent foci of T2 hyperintensity are seen within the white matter of the cerebral  hemispheres, nonspecific, more pronounced than expected for patient's age. No hemorrhage, hydrocephalus, extra-axial collection or mass lesion. Vascular: Normal flow voids. Skull and upper cervical spine: Normal marrow signal. Sinuses/Orbits: Mucous retention cyst in the left maxillary sinus. The orbits are maintained. IMPRESSION: 1. Area of restricted diffusion involving the posterior limb of internal capsule on the right extending into the corona radiata, consistent with acute/subacute infarct. 2. Remote infarct in the left basal ganglia region and left side of the pons. 3. Scattered confluent foci of T2 hyperintensity are seen within the white matter of the cerebral hemispheres, nonspecific, more pronounced than expected for patient's age. Findings may represent chronic microvascular ischemic changes. Electronically Signed: By: Baldemar LenisKatyucia  De Macedo Rodrigues M.D. On: 10/12/2020 15:22   CT ANGIO HEAD CODE STROKE  Result Date: 10/13/2020 CLINICAL DATA:  Acute infarct on MRI EXAM: CT ANGIOGRAPHY HEAD AND NECK TECHNIQUE: Multidetector CT imaging of the head and neck was performed using the standard protocol during bolus administration of intravenous contrast. Multiplanar CT image reconstructions and MIPs were obtained to evaluate the vascular anatomy. Carotid stenosis measurements (when applicable) are obtained utilizing NASCET criteria, using the distal internal carotid diameter as the denominator. CONTRAST:  75mL OMNIPAQUE IOHEXOL 350 MG/ML SOLN COMPARISON:  10/12/2020 FINDINGS: CT HEAD Brain: There is no acute intracranial hemorrhage or mass effect. There is subtle low-attenuation in the right corona radiata corresponding to infarction on MRI. Chronic infarct of the left basal ganglia and adjacent white matter. Additional patchy and confluent areas of hypoattenuation in the supratentorial white likely reflect age advanced chronic microvascular ischemic changes. Ventricles are stable size. There is no  extra-axial fluid collection. Vascular: No new findings. Skull: Calvarium is unremarkable. Sinuses/Orbits: No acute finding. Other: None. Review of the MIP images confirms the above findings CTA NECK Aortic arch: Great vessel origins are patent. Right carotid system: Patent. No measurable stenosis at the ICA origin. Left carotid system: Patent. No measurable stenosis at the ICA origin. Vertebral arteries: Patent and codominant. Skeleton: Mild cervical spine degenerative changes. Other neck: No significant abnormality. Upper chest: No apical lung mass. Review of the MIP images confirms the above findings CTA HEAD Anterior circulation: Intracranial internal carotid arteries patent with atherosclerotic irregularity primarily along the cavernous segments, right greater left. Irregular noncalcified plaque is present the floor of the posterior genu. There is a 3 x 2 mm inferiorly directed aneurysm the distal supraclinoid right ICA. Anterior cerebral arteries are patent. Hypoplastic right A1 ACA. Middle cerebral arteries are patent. There is mild atherosclerotic irregularity of distal branches. Posterior circulation: Intracranial vertebral arteries, basilar artery, and posterior cerebral arteries are patent. Venous sinuses: Patent as allowed by contrast bolus timing. Review of the MIP images confirms the above findings IMPRESSION: No acute intracranial hemorrhage. Evolving acute infarction of the corona radiata better seen on MRI. No hemodynamically significant stenosis in the neck. Right greater than left intracranial internal carotid atherosclerosis. Irregular noncalcified plaque the floor of the posterior genu. Mild atherosclerotic irregularity of distal MCA branches bilaterally. 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 Electronically Signed   By:  Guadlupe Spanish M.D.   On: 10/13/2020 14:05    Cora Collum, DO 10/13/2020, 7:06 AM PGY-1, [facility] FPTS Intern pager: [phone],  text pages welcome

## 2020-10-13 NOTE — Progress Notes (Signed)
Pt had a fall while working with PT. Patient denies pain. Pt is back in chair with the alarm set. RN will continue to monitor.

## 2020-10-13 NOTE — Progress Notes (Signed)
Inpatient Rehab Admissions Coordinator Note:   Per PT recommendations, pt was screened for CIR candidacy by Estill Dooms, PT, DPT.  At this time we are recommending a CIR consult.  IF pt would like to be considered, please place an IP rehab MD consult order.  Note that bed availability on CIR is extremely limited for the remainder of the week and may not have a bed for this patient until next week.  Please contact me with questions.   Estill Dooms, PT, DPT [phone] 10/13/20 8:40 PM

## 2020-10-14 DIAGNOSIS — R29898 Other symptoms and signs involving the musculoskeletal system: Secondary | ICD-10-CM | POA: Diagnosis not present

## 2020-10-14 DIAGNOSIS — I639 Cerebral infarction, unspecified: Secondary | ICD-10-CM | POA: Diagnosis not present

## 2020-10-14 DIAGNOSIS — I10 Essential (primary) hypertension: Secondary | ICD-10-CM

## 2020-10-14 LAB — GLUCOSE, CAPILLARY: Glucose-Capillary: 100 mg/dL — ABNORMAL HIGH (ref 70–99)

## 2020-10-14 LAB — BASIC METABOLIC PANEL
Anion gap: 11 (ref 5–15)
BUN: 5 mg/dL — ABNORMAL LOW (ref 6–20)
CO2: 23 mmol/L (ref 22–32)
Calcium: 10.1 mg/dL (ref 8.9–10.3)
Chloride: 104 mmol/L (ref 98–111)
Creatinine, Ser: 0.75 mg/dL (ref 0.44–1.00)
GFR, Estimated: >60 mL/min (ref >60)
Glucose, Bld: 111 mg/dL — ABNORMAL HIGH (ref 70–99)
Potassium: 3.2 mmol/L — ABNORMAL LOW (ref 3.5–5.1)
Sodium: 138 mmol/L (ref 135–145)

## 2020-10-14 LAB — HEMOGLOBIN A1C
Hgb A1c MFr Bld: 5.6 % (ref 4.8–5.6)
Mean Plasma Glucose: 114 mg/dL

## 2020-10-14 MED ORDER — ATORVASTATIN CALCIUM 40 MG PO TABS
40.0000 mg | ORAL_TABLET | Freq: Every day | ORAL | Status: DC
  Administered 2020-10-14 – 2020-10-17 (×4): 40 mg via ORAL
  Filled 2020-10-14 (×3): qty 1
  Filled 2020-10-14: qty 4

## 2020-10-14 MED ORDER — LISINOPRIL 10 MG PO TABS
10.0000 mg | ORAL_TABLET | Freq: Every day | ORAL | Status: DC
  Administered 2020-10-14 – 2020-10-15 (×2): 10 mg via ORAL
  Filled 2020-10-14: qty 1
  Filled 2020-10-14: qty 4
  Filled 2020-10-14 (×2): qty 1

## 2020-10-14 MED ORDER — POTASSIUM CHLORIDE CRYS ER 20 MEQ PO TBCR
40.0000 meq | EXTENDED_RELEASE_TABLET | Freq: Once | ORAL | Status: AC
  Administered 2020-10-14: 40 meq via ORAL
  Filled 2020-10-14: qty 2

## 2020-10-14 MED ORDER — POLYETHYLENE GLYCOL 3350 17 G PO PACK
17.0000 g | PACK | Freq: Every day | ORAL | Status: DC
  Administered 2020-10-14 – 2020-10-15 (×2): 17 g via ORAL
  Filled 2020-10-14 (×3): qty 1

## 2020-10-14 NOTE — Plan of Care (Signed)
  Problem: Education: Goal: Knowledge of General Education information will improve Description Including pain rating scale, medication(s)/side effects and non-pharmacologic comfort measures Outcome: Progressing   Problem: Health Behavior/Discharge Planning: Goal: Ability to manage health-related needs will improve Outcome: Progressing   Problem: Clinical Measurements: Goal: Ability to maintain clinical measurements within normal limits will improve Outcome: Progressing Goal: Will remain free from infection Outcome: Progressing Goal: Diagnostic test results will improve Outcome: Progressing Goal: Respiratory complications will improve Outcome: Progressing Goal: Cardiovascular complication will be avoided Outcome: Progressing   Problem: Activity: Goal: Risk for activity intolerance will decrease Outcome: Progressing   Problem: Nutrition: Goal: Adequate nutrition will be maintained Outcome: Progressing   Problem: Coping: Goal: Level of anxiety will decrease Outcome: Progressing   Problem: Elimination: Goal: Will not experience complications related to bowel motility Outcome: Progressing Goal: Will not experience complications related to urinary retention Outcome: Progressing   Problem: Pain Managment: Goal: General experience of comfort will improve Outcome: Progressing   Problem: Safety: Goal: Ability to remain free from injury will improve Outcome: Progressing   Problem: Skin Integrity: Goal: Risk for impaired skin integrity will decrease Outcome: Progressing   Problem: Education: Goal: Knowledge of disease or condition will improve Outcome: Progressing Goal: Knowledge of secondary prevention will improve Outcome: Progressing Goal: Knowledge of patient specific risk factors addressed and post discharge goals established will improve Outcome: Progressing Goal: Individualized Educational Video(s) Outcome: Progressing   Problem: Coping: Goal: Will verbalize  positive feelings about self Outcome: Progressing Goal: Will identify appropriate support needs Outcome: Progressing   Problem: Health Behavior/Discharge Planning: Goal: Ability to manage health-related needs will improve Outcome: Progressing   

## 2020-10-14 NOTE — Progress Notes (Signed)
A clinical question can you do of sleep small bowel movement in moderate since she is out of town for work you okay STROKE TEAM PROGRESS NOTE   INTERVAL HISTORY Pt is evaluated at the bedside. No family present in the room.   Pt states she is interested in enrolling in Obstructive sleep apnea research study. Attending to discuss enrollment with her later today or tomorrow. On Neuro exam, she is alert and oriented x4. Mild Ptosis of Lt eye (from last stroke), weakness in Left hand, and subtle weakness in Lt leg observed. Lt Leg weakness has improved. Her BP has been running high with systolic of 154 to 185 and diastolic of 73-88 mmHg. She has been Afebrile.  HBA1c is 5.6. PT/OT Eval recommended CIR. ECHO showed LVEF of 60-65%. CTA Head and Neck showed No acute intracranial hemorrhage. Evolving acute infarction of the corona radiata. Right greater than left intracranial internal carotid atherosclerosisand 3 x 2 mm aneurysm of the distal supraclinoid right ICA. Educated pt about controlling high risk factors such as HTN, DM and continuing DAPT. Pt agrees with plan. Pt on DAPT (Aspirin and Plavix). Recommend continuing Aspirin and Plavix for 3 weeks and then Aspirin alone.  No more neurological interventions needed. Neurology will sign off.   Vitals:   10/13/20 2058 10/13/20 2340 10/14/20 0422 10/14/20 0829  BP: (!) 184/82 (!) 154/76 (!) 184/76 (!) 168/73  Pulse: 71 68 65 75  Resp: 17 17 20 20   Temp: 98.1 F (36.7 C) 98.2 F (36.8 C) 98.3 F (36.8 C) 98.3 F (36.8 C)  TempSrc: Oral Oral Oral Oral  SpO2: 99% 97% 93% 100%  Weight:      Height:       CBC:  Recent Labs  Lab 10/12/20 1009 10/12/20 1841  WBC 6.4 6.0  NEUTROABS 4.9  --   HGB 13.4 12.4  HCT 41.5 39.0  MCV 81.9 82.1  PLT 168 155   Basic Metabolic Panel:  Recent Labs  Lab 10/13/20 0348 10/14/20 0347  NA 139 138  K 3.9 3.2*  CL 105 104  CO2 24 23  GLUCOSE 104* 111*  BUN 6 5*  CREATININE 0.75 0.75  CALCIUM 9.8 10.1    Lipid Panel:  Recent Labs  Lab 10/13/20 0348  CHOL 209*  TRIG 66  HDL 52  CHOLHDL 4.0  VLDL 13  LDLCALC 10/15/20*   HgbA1c:  Recent Labs  Lab 10/13/20 0348  HGBA1C 5.6   Urine Drug Screen:  Recent Labs  Lab 10/12/20 0955  LABOPIA NONE DETECTED  COCAINSCRNUR NONE DETECTED  LABBENZ NONE DETECTED  AMPHETMU NONE DETECTED  THCU NONE DETECTED  LABBARB NONE DETECTED    Alcohol Level  Recent Labs  Lab 10/12/20 1010  ETH <10    IMAGING past 24 hours ECHOCARDIOGRAM COMPLETE  Result Date: 10/13/2020    ECHOCARDIOGRAM REPORT   Patient Name:   Shannon Garza Date of Exam: 10/13/2020 Medical Rec #:  10/15/2020         Height:       63.0 in Accession #:    [id number]        Weight:       288.0 lb Date of Birth:  [birth year]          BSA:          2.258 m Patient Age:    54 years          BP:           181/67 mmHg  Patient Gender: F                 HR:           54 bpm. Exam Location:  Inpatient Procedure: 2D Echo, Cardiac Doppler and Color Doppler Indications:    CVA  History:        Patient has no prior history of Echocardiogram examinations.                 Stroke; Risk Factors:Hypertension, Diabetes, Dyslipidemia, Sleep                 Apnea and Morbid obesity.  Sonographer:    Lavenia Atlas Referring Phys: [phone] ANKIT NANAVATI  Sonographer Comments: Patient is morbidly obese and Technically difficult study due to poor echo windows. Image acquisition challenging due to patient body habitus. Hard to hold breast up for apical windows patient too obese!!! IMPRESSIONS  1. Left ventricular ejection fraction, by estimation, is 60 to 65%. The left ventricle has normal function. The left ventricle has no regional wall motion abnormalities. There is moderate left ventricular hypertrophy. Left ventricular diastolic parameters are indeterminate.  2. Right ventricular systolic function was not well visualized. The right ventricular size is not well visualized. Tricuspid regurgitation signal is  inadequate for assessing PA pressure.  3. The mitral valve is normal in structure. No evidence of mitral valve regurgitation.  4. The aortic valve is tricuspid. Aortic valve regurgitation is not visualized. No aortic stenosis is present. FINDINGS  Left Ventricle: Left ventricular ejection fraction, by estimation, is 60 to 65%. The left ventricle has normal function. The left ventricle has no regional wall motion abnormalities. The left ventricular internal cavity size was normal in size. There is  moderate left ventricular hypertrophy. Left ventricular diastolic parameters are indeterminate. Right Ventricle: The right ventricular size is not well visualized. Right vetricular wall thickness was not well visualized. Right ventricular systolic function was not well visualized. Tricuspid regurgitation signal is inadequate for assessing PA pressure. Left Atrium: Left atrial size was normal in size. Right Atrium: Right atrial size was not well visualized. Pericardium: Trivial pericardial effusion is present. Mitral Valve: The mitral valve is normal in structure. No evidence of mitral valve regurgitation. Tricuspid Valve: The tricuspid valve is normal in structure. Tricuspid valve regurgitation is trivial. Aortic Valve: The aortic valve is tricuspid. Aortic valve regurgitation is not visualized. No aortic stenosis is present. Pulmonic Valve: The pulmonic valve was not well visualized. Pulmonic valve regurgitation is not visualized. Aorta: The aortic root and ascending aorta are structurally normal, with no evidence of dilitation. IAS/Shunts: The interatrial septum was not well visualized.  LEFT VENTRICLE PLAX 2D LVIDd:         4.20 cm  Diastology LVIDs:         2.80 cm  LV e' medial:    6.53 cm/s LV PW:         1.50 cm  LV E/e' medial:  9.6 LV IVS:        1.40 cm  LV e' lateral:   4.68 cm/s LVOT diam:     2.30 cm  LV E/e' lateral: 13.5 LV SV:         59 LV SV Index:   26 LVOT Area:     4.15 cm  RIGHT VENTRICLE RV S prime:      5.98 cm/s LEFT ATRIUM             Index       RIGHT  ATRIUM           Index LA diam:        4.80 cm 2.13 cm/m  RA Area:     22.30 cm LA Vol (A2C):   45.7 ml 20.24 ml/m RA Volume:   69.40 ml  30.74 ml/m LA Vol (A4C):   67.4 ml 29.85 ml/m LA Biplane Vol: 58.5 ml 25.91 ml/m  AORTIC VALVE LVOT Vmax:   80.30 cm/s LVOT Vmean:  48.400 cm/s LVOT VTI:    0.142 m  AORTA Ao Root diam: 3.00 cm MITRAL VALVE MV Area (PHT): 2.48 cm    SHUNTS MV Decel Time: 306 msec    Systemic VTI:  0.14 m MV E velocity: 63.00 cm/s  Systemic Diam: 2.30 cm MV A velocity: 65.10 cm/s MV E/A ratio:  0.97 Epifanio Lesches MD Electronically signed by Epifanio Lesches MD Signature Date/Time: 10/13/2020/4:53:27 PM    Final     PHYSICAL EXAM GENERAL: Pleasant middle-age obese African-American lady awake, alert in NAD HEENT: - Normocephalic and atraumatic LUNGS - Symmetrical chest rise, No labored breathing noted CV - no JVD, No Peripheral Edema ABDOMEN - Soft,  nondistended  Ext: warm, well perfused, intact peripheral pulses, no Peripheral edema.  NEURO Exam:   Mental Status: AA&Ox4.  Oriented to self, age, place, situation. Good Attention and Concentration Language: speech is fluent.  fluency, and comprehension intact. Cranial Nerves:   CN II Pupils equal and reactive to light, no VF deficits    CN III,IV,VI EOM intact, no gaze preference or deviation, no nystagmus    CN V normal sensation in V1, V2, and V3 segments bilaterally    CN VII Mild Left facial droop, no nasolabial fold flattening Mild ptosis in left eye (from previous stroke)    CN VIII normal hearing to speech    CN IX & X normal palatal elevation, no uvular deviation    CN XI 5/5 head turn and 5/5 shoulder shrug bilaterally    CN XII midline tongue protrusion    Motor: Drift in Left Upper Extremity, No Drift in LE's. Strength 5/5 in Rt UE, 4/5 in Lt UE, Strength in Rt LE  5/5, Lt LE 4+/5 (improving).  Weakness of left grip and intrinsic hand muscles.   Orbits right over left upper extremity. Planters- Down going Tone: is normal and bulk is normal. Sensation- Intact to light touch bilaterally. Coordination: FTN intact bilaterally, no ataxia. Gait- deferred NIH stroke scale 2 1 for facial weakness and 1 for left upper extremity drift.   Premorbid modified Rankin score 0  ASSESSMENT/PLAN Shannon Garza is a 55 y.o. female with history of  previous stroke, diabetes and hypertension which are untreated due to the patient's pursuing a naturopathic approach. She presented with some mild left leg weakness which worsened overnight and included her arm and this prompted her to seek care. CT Head showed chronic small-vessel infarct within the left basal ganglia and internal capsule. MRI showed Area of restricted diffusion involving the posterior limb of internal capsule on the right extending into the corona radiata, consistent with acute/subacute infarct and Remote infarct in the left basal ganglia. ECHO showed LVEF of 60-65%. CTA Head and Neck showed No acute intracranial hemorrhage. Evolving acute infarction of the corona radiata. Right greater than left intracranial internal carotid atherosclerosisand 3 x 2 mm aneurysm of the distal supraclinoid right ICA. Pt was given loading dose of Aspirin and Plavix and then started on DAPT (Aspirin and Plavix). Recommend continuing Aspirin and  Plavix for 3 weeks and then Aspirin alone.  No more neurological interventions needed. Neurology will sign off.   Stroke Acute/subacute infarct of posterior limb of internal capsule on the right extending into the corona radiata, Etiology most likely secondary to small vessel disease due to risk factors of HTN, obesity, at risk for sleep apnea and DM.    CT Code Stroke- No acute intracranial abnormality. Chronic small-vessel infarct within the left basal ganglia and        internal capsule. Chronic right basal ganglia lacunar infarct. Advanced cerebral white matter  disease, nonspecific        but  most often secondary to chronic small vessel ischemia. Partially empty sella turcica. This finding is very                  Commonly.        incidental, but can be associated with idiopathic intracranial hypertension.   CTA head and Neck-  No acute intracranial hemorrhage. Evolving acute infarction of the corona radiata. Right greater than left intracranial internal carotid atherosclerosisand 3 x 2 mm aneurysm of the distal supraclinoid right ICA.  CT perfusion - Not ordered.  MRI - Area of restricted diffusion involving the posterior limb of  internal capsule on the right extending into the corona radiata, consistent with acute/subacute infarct. Remote infarct in the left basal ganglia region and left side of the pons. chronic microvascular ischemic changes.  MRA  Not ordered  Carotid Doppler  Not ordered  2D Echo - LVEF 60-65%  LDL 144  HgbA1c No results found for requested labs within last [id number] hours.  VTE prophylaxis - Lovenox    Diet   Diet regular Room service appropriate? Yes; Fluid consistency: Thin    No antithrombotic prior to admission, now on aspirin 81 mg daily and clopidogrel 75 mg daily. (Continue DAPT for 3 weeks and then Aspirin alone)  Therapy recommendations:  CIR  Disposition:  Neurology will sign off.   Hypertension  Home meds:  None  Unstable . Permissive hypertension (OK if < 220/120) but gradually normalize in 5-7 days . Long-term BP goal normotensive  Hyperlipidemia  Home meds:  None,   LDL 144, goal < 70  Cholesterol 209, HDL 52  Rec statin at discharge  Diabetes type II Controlled  Home meds:  None  HgbA1c No results found for requested labs within last [id number] hours., goal < 7.0  CBGs Recent Labs    10/12/20 1147 10/13/20 0047  GLUCAP 85 97     Obstructive sleep Apnea?  Talked to Pt about the possibility of Obstructive sleep apnea and need for Sleep study.  Attending to discuss enrollment  in Research Sleep Study for OSA.   Other Stroke Risk Factors   Obesity, Body mass index is 51.02 kg/m., BMI >30 associated with increased stroke risk, recommend weight loss, diet and exercise as appropriate.    Hx stroke in 2018- Not on Aspirin and Plavix. Taking Herbal supplements instead.   Family hx stroke (Grandmother)   Hospital day # 2 Patient states she is doing well.  Still has mild left-sided weakness.  CT angiogram shows no significant large vessel stenosis or occlusion but shows 2 to 3 mm terminal right internal carotid artery aneurysm.  This can be managed conservatively.  Patient does not have any significant risk factors which puts her at high risk in the form of family history, prior enzymes of smoking control hypertension.  Patient is agreeable to considering participation in  the sleep sleep smart study today.  Recommend aspirin Plavix for 3 weeks followed by aspirin alone and aggressive risk factor modification.  Greater than 50% time during this 25-minute visit was spent on counseling and coordination of care about her lacunar stroke and discussion about sleep apnea and possible participation in sleep smart study and answering questions.  Delia Heady, MD   To contact Stroke Continuity provider, please refer to [email or web address]. After hours, contact General Neurology

## 2020-10-14 NOTE — Progress Notes (Signed)
Inpatient Rehabilitation Admissions Coordinator  I met at bedside with patient and her sister. We discussed goals and expectations of a possible CIR admit. They ar in agreement. I will begin insurance Auth with [facility]. Admit to CIR pending bed availability and approval.  Danne Baxter, RN, MSN Rehab Admissions Coordinator [phone] 10/14/2020 3:39 PM

## 2020-10-14 NOTE — Progress Notes (Signed)
Physical Therapy Treatment Patient Details Name: Shannon Garza MRN: [id number] DOB: 03/15/1966 Today's Date: 10/14/2020    History of Present Illness Pt is a 55 y.o. female who presents with acute L-sided limb and facial weakness. MRI revealed acute/subacute infarct of R posterior internal capsule and remote infarct of L basal ganglia and L pons. Outside window for tPA. PMH: CVA 4 years ago, HTN, and DM2.    PT Comments    Pt making progress with mobility safety and independence today as she was able to ambulate an increased distance of up to ~30 ft within the room with a RW and minAx1-2 majority of time. However, pt has spontaneous L lateral LOB bouts needing modA to recover. PT guarding pt throughout on her L, blocking her in case of LOB, with aide posterior to pt for safety precautions. Improved gait safety when cued pt step-by-step to move either RW or 1 foot at a time and keep RW on ground for improved stability. Her L side weakness impacts her grip on the RW on the L and her knee stability, placing her at risk for falls. No knee buckling noted this date though. Will continue to follow acutely. Current recommendations remain appropriate.   Follow Up Recommendations  CIR;Supervision for mobility/OOB     Equipment Recommendations  Rolling walker with 5" wheels;3in1 (PT)    Recommendations for Other Services Rehab consult     Precautions / Restrictions Precautions Precautions: Fall Restrictions Weight Bearing Restrictions: No Other Position/Activity Restrictions: acute LUE weakness    Mobility  Bed Mobility Overal bed mobility: Needs Assistance Bed Mobility: Supine to Sit     Supine to sit: Min guard     General bed mobility comments: Pt able to transition to sit R EOB with use of bed rails and extra time and effort, min guard for safety cuing for L arm and leg management.    Transfers Overall transfer level: Needs assistance Equipment used: Rolling walker (2  wheeled) Transfers: Sit to/from Stand Sit to Stand: Min assist         General transfer comment: Cued pt to push up from bed and transition hands to RW, minA for steadying and extra time to power up to stand. No overt LOB. Assistance for L hand placement on RW.  Ambulation/Gait Ambulation/Gait assistance: Min assist;Mod assist;+2 safety/equipment Gait Distance (Feet): 30 Feet Assistive device: Rolling walker (2 wheeled) Gait Pattern/deviations: Step-through pattern;Decreased step length - right;Decreased step length - left;Decreased stride length;Staggering left Gait velocity: reduced Gait velocity interpretation: <1.31 ft/sec, indicative of household ambulator General Gait Details: Pt ambulating with slight L knee instability but no appreciative buckling, providing tactile cues intermittently at L quad during stance. During initial few steps pt had a sudden LOB to the L needing modA to recover safely. Cued pt to only move RW or 1 foot at a time for safety, especially when turning, and to keep RW in contact with ground, minAx1-2 for safety rest of gait bout. Tends to drift and stagger to L, thus blocked pt with PT's body on L throughout for safety and aide posterior to pt for further support.   Stairs             Wheelchair Mobility    Modified Rankin (Stroke Patients Only) Modified Rankin (Stroke Patients Only) Pre-Morbid Rankin Score: No symptoms Modified Rankin: Moderately severe disability     Balance Overall balance assessment: Needs assistance Sitting-balance support: Feet supported Sitting balance-Leahy Scale: Fair Sitting balance - Comments: Static sitting EOB,  supervision for safety.   Standing balance support: Bilateral upper extremity supported;During functional activity Standing balance-Leahy Scale: Poor Standing balance comment: Reliant on UE support and min A majority of mobility for stability.                            Cognition  Arousal/Alertness: Awake/alert Behavior During Therapy: WFL for tasks assessed/performed;Impulsive Overall Cognitive Status: No family/caregiver present to determine baseline cognitive functioning                                 General Comments: Pt able to overall answer questions appropriately, A&Ox4. Pt with poor insight into deficits and safety awareness, needing cues to sequence mobility with RW for safety.      Exercises General Exercises - Lower Extremity Long Arc Quad: Strengthening;Both;5 reps;Seated (educated on LAQ HEP with emphasis on eccentric control)    General Comments        Pertinent Vitals/Pain Pain Assessment: No/denies pain    Home Living                      Prior Function            PT Goals (current goals can now be found in the care plan section) Acute Rehab PT Goals Patient Stated Goal: regain independence, return to work PT Goal Formulation: With patient Time For Goal Achievement: 10/27/20 Potential to Achieve Goals: Good Progress towards PT goals: Progressing toward goals    Frequency    Min 4X/week      PT Plan Current plan remains appropriate    Co-evaluation              AM-PAC PT "6 Clicks" Mobility   Outcome Measure  Help needed turning from your back to your side while in a flat bed without using bedrails?: A Little Help needed moving from lying on your back to sitting on the side of a flat bed without using bedrails?: A Little Help needed moving to and from a bed to a chair (including a wheelchair)?: A Little Help needed standing up from a chair using your arms (e.g., wheelchair or bedside chair)?: A Little Help needed to walk in hospital room?: A Lot Help needed climbing 3-5 steps with a railing? : A Lot 6 Click Score: 16    End of Session Equipment Utilized During Treatment: Gait belt Activity Tolerance: Patient tolerated treatment well Patient left: in chair;with call bell/phone within  reach;with chair alarm set Nurse Communication: Mobility status PT Visit Diagnosis: Unsteadiness on feet (R26.81);Other abnormalities of gait and mobility (R26.89);Muscle weakness (generalized) (M62.81);Difficulty in walking, not elsewhere classified (R26.2);Other symptoms and signs involving the nervous system (R29.898);Hemiplegia and hemiparesis Hemiplegia - Right/Left: Left Hemiplegia - dominant/non-dominant: Non-dominant Hemiplegia - caused by: Cerebral infarction     Time: 1040-1059 PT Time Calculation (min) (ACUTE ONLY): 19 min  Charges:  $Gait Training: 8-22 mins                     Raymond Gurney, PT, DPT Acute Rehabilitation Services  Pager: [phone] Office: [phone]    Jewel Baize 10/14/2020, 1:25 PM

## 2020-10-14 NOTE — Consult Note (Signed)
Physical Medicine and Rehabilitation Consult Reason for Consult: Left side weakness Referring Physician: Family medicine   HPI: Shannon Garza is a 55 y.o. right-handed female with history of diabetes mellitus CVA 2018, and hypertension.  Per chart review patient lives with brother sister and sister-in-law.  Independent prior to admission works from home.  Multilevel home bed and bath upstairs two steps to entry.  Sister can provide assistance.  Presented 10/12/2020 with  left-sided weakness 10/11/2020 of acute onset.  Noted blood pressure systolic 163-194.  CT/MRI showed area of restricted diffusion involving the posterior limb of internal capsule on the right extending into the corona radiata consistent with acute/subacute infarction.  Remote infarct in left basal ganglia and left side of the pons.  CT angiogram of head and neck no hemodynamically significant stenosis in the neck.  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  Echocardiogram with ejection fraction of 60 to 65% no wall motion abnormalities.  Admission chemistries urine drug screen negative, potassium 3.2, glucose 126, alcohol negative, hemoglobin A1c 5.6.  Currently maintained on aspirin and Plavix for CVA prophylaxis x3 weeks then aspirin alone.  Subcutaneous Lovenox for DVT prophylaxis.  Tolerating a regular diet.  Therapy evaluations completed with recommendations of physical medicine rehab consult due to left-sided weakness   Review of Systems  Constitutional: Negative for chills and fever.  HENT: Negative for hearing loss.   Eyes: Negative for blurred vision and double vision.  Respiratory: Negative for cough and shortness of breath.   Cardiovascular: Negative for chest pain, palpitations and leg swelling.  Gastrointestinal: Positive for constipation. Negative for heartburn, nausea and vomiting.  Genitourinary: Negative for dysuria.  Musculoskeletal: Positive for myalgias.  Skin: Negative for rash.   Neurological: Positive for weakness.       Occasional headaches  All other systems reviewed and are negative.  Past Medical History:  Diagnosis Date  . Diabetes mellitus without complication (HCC)   . Hypertension    History reviewed. No pertinent surgical history. History reviewed. No pertinent family history. Social History:  reports that she has never smoked. She has never used smokeless tobacco. She reports previous alcohol use. She reports previous drug use. Allergies: No Known Allergies Medications Prior to Admission  Medication Sig Dispense Refill  . DANDELION PO Take 1 tablet by mouth daily.    . Ferrous Sulfate (IRON PO) Take 1 tablet by mouth daily.    Marland Kitchen HAWTHORN PO Take 1 tablet by mouth daily.    Marland Kitchen MAGNESIUM PO Take 1 tablet by mouth daily.    Marland Kitchen POTASSIUM PO Take 1 tablet by mouth daily. Over the counter    . Turmeric (QC TUMERIC COMPLEX PO) Take 1 tablet by mouth daily.    . vitamin C (ASCORBIC ACID) 500 MG tablet Take 1,000 mg by mouth daily.      Home: Home Living Family/patient expects to be discharged to:: Private residence Living Arrangements: Other relatives (brother, sister, and sister-in-law) Available Help at Discharge: Family,Available 24 hours/day Type of Home: House Home Access: Stairs to enter [employer] of Steps: 2 Entrance Stairs-Rails: Right,Left,Can reach both Home Layout: Multi-level,Bed/bath upstairs Alternate Level Stairs-Number of Steps: full flight Alternate Level Stairs-Rails: Left Bathroom Shower/Tub: Health visitor: Handicapped height Home Equipment: Hand held shower head,Shower seat - built in Additional Comments: typically uses tub shower in her room. Could potentially have access to walkin shower with built in seat in brother's bathroom.  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 Pt drives. Functional Status:  Mobility: Bed  Mobility Overal bed mobility: Needs Assistance Bed Mobility: Rolling,Sidelying to Sit Rolling: Min guard (partial roll to come to EOB) Sidelying to sit: Mod assist General bed mobility comments: Pt rolled to L with min guard and use of bed rail. Cued pt to bring L elbow under trunk to push up to ascend trunk, modA to complete as poor initiation noted by L arm. Bed flat during all bed mob. Transfers Overall transfer level: Needs assistance Equipment used: Rolling walker (2 wheeled) Transfers: Sit to/from Stand Sit to Stand: Min assist Stand pivot transfers: Min assist General transfer comment: Cued pt to push up from bed and transition hands to RW, minA for steadying and extra time to power up to stand. No overt LOB. Ambulation/Gait Ambulation/Gait assistance: Min assist,Max assist Gait Distance (Feet): 15 Feet Assistive device: Rolling walker (2 wheeled) Gait Pattern/deviations: Step-through pattern,Decreased step length - right,Decreased step length - left,Decreased stride length,Staggering left General Gait Details: Initially, pt ambulating slowly but steady and no knee buckling or LOB with min guard-A towards door of room using RW. PT guarding pt posterior to pt with hands on gait belt throughout. When pt was cued to turn around towards the R to return back towards chair pt became quiet and then suddenly staggered to the L. PT unable to fully recover pt's balance with maxA and thus PT controlled descent with maxA and with use of gait belt as pt slid her back down the bathroom door until she was sitting on the ground. RNs and NT called to assist pt off ground back to chair, again only needing minA to ambulate another ~15 ft back to chair without LOB this time. RN assess pt with BP being high but RN reported it had been like that all day and was still within set normal parameters at this time. Repeatedly asked pt if she hit her head or sustained any injuries, in which she denied all. Initially she  did report her L pinky finger hurt a little though. Gait velocity: reduced Gait velocity interpretation: <1.31 ft/sec, indicative of household ambulator    ADL: ADL Overall ADL's : Needs assistance/impaired Eating/Feeding: Minimal assistance,Sitting Eating/Feeding Details (indicate cue type and reason): assist for bilateral tasks. Impairment is in nondominant UE Grooming: Moderate assistance,Sitting Upper Body Bathing: Moderate assistance,Sitting Lower Body Bathing: Minimal assistance,Sit to/from stand Upper Body Dressing : Moderate assistance,Sitting Lower Body Dressing: Minimal assistance,Sit to/from stand Toilet Transfer: Minimal assistance Toilet Transfer Details (indicate cue type and reason): steadying assist to take pivotal steps to recliner Toileting- Clothing Manipulation and Hygiene: Minimal assistance,Sit to/from stand Tub/ Shower Transfer: Tub transfer,Minimal assistance Functional mobility during ADLs: Minimal assistance (steadying assist to take pivotal steps to recliner) General ADL Comments: Presents with LUE deficits in strength (3-/5 at all levels) and coordination, possibly some mild L side neglect? impacting UB/LB ADLs. A bit impulsive. steadying assist to take pivtal steps EOB to recliner  Cognition: Cognition Overall Cognitive Status: No family/caregiver present to determine baseline cognitive functioning Arousal/Alertness: Awake/alert Orientation Level: Oriented X4 Attention: Focused,Sustained Focused Attention: Appears intact Sustained Attention: Appears intact Memory: Impaired Memory Impairment: Retrieval deficit,Storage deficit,Decreased recall of new information (Immediate: 5/5; delayed: 4/5; with cue: 1/1) Awareness: Impaired Awareness Impairment: Emergent impairment Problem Solving: Impaired Problem Solving Impairment: Verbal complex Executive Function: Reasoning,Sequencing,Organizing Reasoning: Appears intact Sequencing: Impaired Sequencing  Impairment: Verbal complex (clockdrawing: 0/4) Organizing Impairment: Verbal complex (backward digit span: 0/2) Cognition  Arousal/Alertness: Awake/alert Behavior During Therapy: WFL for tasks assessed/performed,Impulsive Overall Cognitive Status: No family/caregiver present to determine baseline cognitive functioning General Comments: Pt able to overall answer questions appropriately, A&Ox4. Question her insight into current deficits as pt had sudden LOB towards the L when trying to turn around to return to chair. Pt reported she was thought her L knee "gave" out under her, causing the fall. When cued to sit up once pt sitting on floor with controlled descent provided by PT she then impulsively leaned posteriorly 2x and needed cues and assistance to return to upright position and maintain it.  Blood pressure (!) 184/76, pulse 65, temperature 98.3 F (36.8 C), temperature source Oral, resp. rate 20, height 5\' 3"  (1.6 m), weight 130.6 kg, SpO2 93 %. Physical Exam Constitutional:      Comments: obese  HENT:     Head: Normocephalic.     Right Ear: External ear normal.     Left Ear: External ear normal.     Nose: Nose normal.  Cardiovascular:     Rate and Rhythm: Normal rate.  Pulmonary:     Effort: Pulmonary effort is normal.  Abdominal:     Palpations: Abdomen is soft.  Musculoskeletal:     Cervical back: Normal range of motion.  Neurological:     Mental Status: She is alert.     Comments: Patient is alert in no acute distress.  Makes eye contact with examiner.  Oriented x3 and follows commands.  Fair awareness of deficits. Speech sl dysarthric but very intelligible. Sl CN7. LUE 2/5 deltoid, 2+ to 3/5 biceps, triceps and 2/5 wrist and HI. LLE 3 to 3+/5 prox to distal. Senses pain and LT in all 4's.   Psychiatric:        Mood and Affect: Mood normal.        Behavior: Behavior normal.        Thought Content: Thought content normal.        Judgment: Judgment normal.     Results for  orders placed or performed during the hospital encounter of 10/12/20 (from the past 24 hour(s))  Basic metabolic panel     Status: Abnormal   Collection Time: 10/14/20  3:47 AM  Result Value Ref Range   Sodium 138 135 - 145 mmol/L   Potassium 3.2 (L) 3.5 - 5.1 mmol/L   Chloride 104 98 - 111 mmol/L   CO2 23 22 - 32 mmol/L   Glucose, Bld 111 (H) 70 - 99 mg/dL   BUN 5 (L) 6 - 20 mg/dL   Creatinine, Ser 10/16/20 0.44 - 1.00 mg/dL   Calcium 3.47 8.9 - 42.5 mg/dL   GFR, Estimated 95.6 >38 mL/min   Anion gap 11 5 - 15   CT HEAD WO CONTRAST  Result Date: 10/12/2020 CLINICAL DATA:  Neuro deficit, acute, stroke suspected. Additional history provided: Left-sided weakness since yesterday, low back pain. EXAM: CT HEAD WITHOUT CONTRAST TECHNIQUE: Contiguous axial images were obtained from the base of the skull through the vertex without intravenous contrast. COMPARISON:  No pertinent prior exams available for comparison. FINDINGS: Brain: Cerebral volume is normal for age. Chronic small-vessel infarct within the left basal ganglia and internal capsule. Small chronic lacunar infarct within the right caudate nucleus. A 3 mm focus of hypodensity in the inferior right basal ganglia likely reflects a prominent perivascular space. Advanced ill-defined hypoattenuation within the cerebral white matter is nonspecific, but most often secondary to chronic small vessel ischemia. There is no acute intracranial  hemorrhage. No demarcated cortical infarct. No extra-axial fluid collection. No evidence of intracranial mass. No midline shift. Partially empty sella turcica. Vascular: No hyperdense vessel.  Atherosclerotic calcifications. Skull: Normal. Negative for fracture or focal lesion. Sinuses/Orbits: Visualized orbits show no acute finding. Trace ethmoid sinus mucosal thickening. IMPRESSION: No evidence of acute intracranial abnormality. Chronic small-vessel infarct within the left basal ganglia and internal capsule. Chronic right  basal ganglia lacunar infarct. Background advanced cerebral white matter disease, nonspecific but most often secondary to chronic small vessel ischemia. Partially empty sella turcica. This finding is very commonly incidental, but can be associated with idiopathic intracranial hypertension. Electronically Signed   By: Jackey Loge DO   On: 10/12/2020 09:57   CT ANGIO NECK W OR WO CONTRAST  Result Date: 10/13/2020 CLINICAL DATA:  Acute infarct on MRI EXAM: CT ANGIOGRAPHY HEAD AND NECK TECHNIQUE: Multidetector CT imaging of the head and neck was performed using the standard protocol during bolus administration of intravenous contrast. Multiplanar CT image reconstructions and MIPs were obtained to evaluate the vascular anatomy. Carotid stenosis measurements (when applicable) are obtained utilizing NASCET criteria, using the distal internal carotid diameter as the denominator. CONTRAST:  28mL OMNIPAQUE IOHEXOL 350 MG/ML SOLN COMPARISON:  10/12/2020 FINDINGS: CT HEAD Brain: There is no acute intracranial hemorrhage or mass effect. There is subtle low-attenuation in the right corona radiata corresponding to infarction on MRI. Chronic infarct of the left basal ganglia and adjacent white matter. Additional patchy and confluent areas of hypoattenuation in the supratentorial white likely reflect age advanced chronic microvascular ischemic changes. Ventricles are stable size. There is no extra-axial fluid collection. Vascular: No new findings. Skull: Calvarium is unremarkable. Sinuses/Orbits: No acute finding. Other: None. Review of the MIP images confirms the above findings CTA NECK Aortic arch: Great vessel origins are patent. Right carotid system: Patent. No measurable stenosis at the ICA origin. Left carotid system: Patent. No measurable stenosis at the ICA origin. Vertebral arteries: Patent and codominant. Skeleton: Mild cervical spine degenerative changes. Other neck: No significant abnormality. Upper chest: No apical  lung mass. Review of the MIP images confirms the above findings CTA HEAD Anterior circulation: Intracranial internal carotid arteries patent with atherosclerotic irregularity primarily along the cavernous segments, right greater left. Irregular noncalcified plaque is present the floor of the posterior genu. There is a 3 x 2 mm inferiorly directed aneurysm the distal supraclinoid right ICA. Anterior cerebral arteries are patent. Hypoplastic right A1 ACA. Middle cerebral arteries are patent. There is mild atherosclerotic irregularity of distal branches. Posterior circulation: Intracranial vertebral arteries, basilar artery, and posterior cerebral arteries are patent. Venous sinuses: Patent as allowed by contrast bolus timing. Review of the MIP images confirms the above findings IMPRESSION: No acute intracranial hemorrhage. Evolving acute infarction of the corona radiata better seen on MRI. No hemodynamically significant stenosis in the neck. Right greater than left intracranial internal carotid atherosclerosis. Irregular noncalcified plaque the floor of the posterior genu. Mild atherosclerotic irregularity of distal MCA branches bilaterally. 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 Electronically Signed   By: Guadlupe Spanish M.D.   On: 10/13/2020 14:05   MR Brain Wo Contrast (neuro protocol)  Addendum Date: 10/12/2020   ADDENDUM REPORT: 10/12/2020 15:32 ADDENDUM: These results were called by telephone on 10/12/2020 at 3:30 pm to provider [facility] , who verbally acknowledged these results. Electronically Signed   By: Baldemar Lenis M.D.   On: 10/12/2020 15:32   Result Date: 10/12/2020 CLINICAL DATA:  Neuro  deficit, acute, stroke suspected. EXAM: MRI HEAD WITHOUT CONTRAST TECHNIQUE: Multiplanar, multiecho pulse sequences of the brain and surrounding structures were obtained without intravenous contrast. COMPARISON:  Head CT October 12, 2020 FINDINGS: Brain: Area of restricted diffusion  involving the posterior limb of internal capsule on the right extending into the corona radiata, consistent with acute/subacute infarct. Remote infarct is seen in the left basal ganglia region and left side of the pons. Scattered confluent foci of T2 hyperintensity are seen within the white matter of the cerebral hemispheres, nonspecific, more pronounced than expected for patient's age. No hemorrhage, hydrocephalus, extra-axial collection or mass lesion. Vascular: Normal flow voids. Skull and upper cervical spine: Normal marrow signal. Sinuses/Orbits: Mucous retention cyst in the left maxillary sinus. The orbits are maintained. IMPRESSION: 1. Area of restricted diffusion involving the posterior limb of internal capsule on the right extending into the corona radiata, consistent with acute/subacute infarct. 2. Remote infarct in the left basal ganglia region and left side of the pons. 3. Scattered confluent foci of T2 hyperintensity are seen within the white matter of the cerebral hemispheres, nonspecific, more pronounced than expected for patient's age. Findings may represent chronic microvascular ischemic changes. Electronically Signed: By: Baldemar Lenis M.D. On: 10/12/2020 15:22   ECHOCARDIOGRAM COMPLETE  Result Date: 10/13/2020    ECHOCARDIOGRAM REPORT   Patient Name:   KORRIE HOFBAUER Date of Exam: 10/13/2020 Medical Rec #:  [id number]         Height:       63.0 in Accession #:    [id number]        Weight:       288.0 lb Date of Birth:  Dec 12, 1965          BSA:          2.258 m Patient Age:    54 years          BP:           181/67 mmHg Patient Gender: F                 HR:           54 bpm. Exam Location:  Inpatient Procedure: 2D Echo, Cardiac Doppler and Color Doppler Indications:    CVA  History:        Patient has no prior history of Echocardiogram examinations.                 Stroke; Risk Factors:Hypertension, Diabetes, Dyslipidemia, Sleep                 Apnea and Morbid obesity.   Sonographer:    Lavenia Atlas Referring Phys: [phone] ANKIT NANAVATI  Sonographer Comments: Patient is morbidly obese and Technically difficult study due to poor echo windows. Image acquisition challenging due to patient body habitus. Hard to hold breast up for apical windows patient too obese!!! IMPRESSIONS  1. Left ventricular ejection fraction, by estimation, is 60 to 65%. The left ventricle has normal function. The left ventricle has no regional wall motion abnormalities. There is moderate left ventricular hypertrophy. Left ventricular diastolic parameters are indeterminate.  2. Right ventricular systolic function was not well visualized. The right ventricular size is not well visualized. Tricuspid regurgitation signal is inadequate for assessing PA pressure.  3. The mitral valve is normal in structure. No evidence of mitral valve regurgitation.  4. The aortic valve is tricuspid. Aortic valve regurgitation is not visualized. No aortic stenosis is present. FINDINGS  Left Ventricle: Left  ventricular ejection fraction, by estimation, is 60 to 65%. The left ventricle has normal function. The left ventricle has no regional wall motion abnormalities. The left ventricular internal cavity size was normal in size. There is  moderate left ventricular hypertrophy. Left ventricular diastolic parameters are indeterminate. Right Ventricle: The right ventricular size is not well visualized. Right vetricular wall thickness was not well visualized. Right ventricular systolic function was not well visualized. Tricuspid regurgitation signal is inadequate for assessing PA pressure. Left Atrium: Left atrial size was normal in size. Right Atrium: Right atrial size was not well visualized. Pericardium: Trivial pericardial effusion is present. Mitral Valve: The mitral valve is normal in structure. No evidence of mitral valve regurgitation. Tricuspid Valve: The tricuspid valve is normal in structure. Tricuspid valve regurgitation is  trivial. Aortic Valve: The aortic valve is tricuspid. Aortic valve regurgitation is not visualized. No aortic stenosis is present. Pulmonic Valve: The pulmonic valve was not well visualized. Pulmonic valve regurgitation is not visualized. Aorta: The aortic root and ascending aorta are structurally normal, with no evidence of dilitation. IAS/Shunts: The interatrial septum was not well visualized.  LEFT VENTRICLE PLAX 2D LVIDd:         4.20 cm  Diastology LVIDs:         2.80 cm  LV e' medial:    6.53 cm/s LV PW:         1.50 cm  LV E/e' medial:  9.6 LV IVS:        1.40 cm  LV e' lateral:   4.68 cm/s LVOT diam:     2.30 cm  LV E/e' lateral: 13.5 LV SV:         59 LV SV Index:   26 LVOT Area:     4.15 cm  RIGHT VENTRICLE RV S prime:     5.98 cm/s LEFT ATRIUM             Index       RIGHT ATRIUM           Index LA diam:        4.80 cm 2.13 cm/m  RA Area:     22.30 cm LA Vol (A2C):   45.7 ml 20.24 ml/m RA Volume:   69.40 ml  30.74 ml/m LA Vol (A4C):   67.4 ml 29.85 ml/m LA Biplane Vol: 58.5 ml 25.91 ml/m  AORTIC VALVE LVOT Vmax:   80.30 cm/s LVOT Vmean:  48.400 cm/s LVOT VTI:    0.142 m  AORTA Ao Root diam: 3.00 cm MITRAL VALVE MV Area (PHT): 2.48 cm    SHUNTS MV Decel Time: 306 msec    Systemic VTI:  0.14 m MV E velocity: 63.00 cm/s  Systemic Diam: 2.30 cm MV A velocity: 65.10 cm/s MV E/A ratio:  0.97 Epifanio Lescheshristopher Schumann MD Electronically signed by Epifanio Lescheshristopher Schumann MD Signature Date/Time: 10/13/2020/4:53:27 PM    Final    CT ANGIO HEAD CODE STROKE  Result Date: 10/13/2020 CLINICAL DATA:  Acute infarct on MRI EXAM: CT ANGIOGRAPHY HEAD AND NECK TECHNIQUE: Multidetector CT imaging of the head and neck was performed using the standard protocol during bolus administration of intravenous contrast. Multiplanar CT image reconstructions and MIPs were obtained to evaluate the vascular anatomy. Carotid stenosis measurements (when applicable) are obtained utilizing NASCET criteria, using the distal internal carotid  diameter as the denominator. CONTRAST:  75mL OMNIPAQUE IOHEXOL 350 MG/ML SOLN COMPARISON:  10/12/2020 FINDINGS: CT HEAD Brain: There is no acute intracranial hemorrhage or mass effect. There  is subtle low-attenuation in the right corona radiata corresponding to infarction on MRI. Chronic infarct of the left basal ganglia and adjacent white matter. Additional patchy and confluent areas of hypoattenuation in the supratentorial white likely reflect age advanced chronic microvascular ischemic changes. Ventricles are stable size. There is no extra-axial fluid collection. Vascular: No new findings. Skull: Calvarium is unremarkable. Sinuses/Orbits: No acute finding. Other: None. Review of the MIP images confirms the above findings CTA NECK Aortic arch: Great vessel origins are patent. Right carotid system: Patent. No measurable stenosis at the ICA origin. Left carotid system: Patent. No measurable stenosis at the ICA origin. Vertebral arteries: Patent and codominant. Skeleton: Mild cervical spine degenerative changes. Other neck: No significant abnormality. Upper chest: No apical lung mass. Review of the MIP images confirms the above findings CTA HEAD Anterior circulation: Intracranial internal carotid arteries patent with atherosclerotic irregularity primarily along the cavernous segments, right greater left. Irregular noncalcified plaque is present the floor of the posterior genu. There is a 3 x 2 mm inferiorly directed aneurysm the distal supraclinoid right ICA. Anterior cerebral arteries are patent. Hypoplastic right A1 ACA. Middle cerebral arteries are patent. There is mild atherosclerotic irregularity of distal branches. Posterior circulation: Intracranial vertebral arteries, basilar artery, and posterior cerebral arteries are patent. Venous sinuses: Patent as allowed by contrast bolus timing. Review of the MIP images confirms the above findings IMPRESSION: No acute intracranial hemorrhage. Evolving acute infarction  of the corona radiata better seen on MRI. No hemodynamically significant stenosis in the neck. Right greater than left intracranial internal carotid atherosclerosis. Irregular noncalcified plaque the floor of the posterior genu. Mild atherosclerotic irregularity of distal MCA branches bilaterally. 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 Electronically Signed   By: Guadlupe Spanish M.D.   On: 10/13/2020 14:05     Assessment/Plan: Diagnosis: Right PLIC-->CR infarcts with left hemiparesis 1. Does the need for close, 24 hr/day medical supervision in concert with the patient's rehab needs make it unreasonable for this patient to be served in a less intensive setting? Yes 2. Co-Morbidities requiring supervision/potential complications: HTN, DM, morbid obesity 3. Due to bladder management, bowel management, safety, skin/wound care, disease management, medication administration, pain management and patient education, does the patient require 24 hr/day rehab nursing? Yes 4. Does the patient require coordinated care of a physician, rehab nurse, therapy disciplines of PT, OT, SLP  to address physical and functional deficits in the context of the above medical diagnosis(es)? Yes Addressing deficits in the following areas: balance, endurance, locomotion, strength, transferring, bowel/bladder control, bathing, dressing, feeding, grooming, toileting, cognition, speech and psychosocial support 5. Can the patient actively participate in an intensive therapy program of at least 3 hrs of therapy per day at least 5 days per week? Yes 6. The potential for patient to make measurable gains while on inpatient rehab is excellent 7. Anticipated functional outcomes upon discharge from inpatient rehab are modified independent and supervision  with PT, modified independent and supervision with OT, modified independent and supervision with SLP. 8. Estimated rehab length of stay to reach the above functional goals is:  12-17 days 9. Anticipated discharge destination: Home 10. Overall Rehab/Functional Prognosis: excellent  RECOMMENDATIONS: This patient's condition is appropriate for continued rehabilitative care in the following setting: CIR Patient has agreed to participate in recommended program. Yes Note that insurance prior authorization may be required for reimbursement for recommended care.  Comment: Rehab Admissions Coordinator to follow up.  Thanks,  Ranelle Oyster, MD, [place] [facility]  I have personally performed a face to face diagnostic evaluation of this patient. Additionally, I have examined pertinent labs and radiographic images. I have reviewed and concur with the physician assistant's documentation above.    Mcarthur Rossetti Angiulli, PA-C 10/14/2020

## 2020-10-14 NOTE — H&P (Signed)
Physical Medicine and Rehabilitation Admission H&P    Chief Complaint  Patient presents with  . Back Pain    lower  : HPI: Shannon Garza is a 55 year old right-handed female with history of prediabetes, CVA 2018 and hypertension.  Patient opted for herbal medicine route rather than taking other medications after CVA.  Per chart review patient lives with her brother sister-in-law and sister.  Independent prior to admission works from home.  Multilevel home bed and bath upstairs 2 steps to entry.  Sister can provide assistance.  Presented 10/12/2020 with left-sided weakness 10/11/2020.  Noted blood pressure systolic 163-194.  CT/MRI showed area of restricted diffusion involving the posterior limb of internal capsule on the right extending into the corona radiata consistent with acute/subacute infarction.  Remote infarct in left basal ganglia and left side of the pons.  CT angiogram of the head and neck no hemodynamically significant stenosis in the neck.  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  Echocardiogram with ejection fraction of 60 to 65% no wall motion abnormalities.  Admission chemistries urine drug screen negative potassium 3.2 glucose 126 alcohol negative hemoglobin A1c 5.6.  Currently maintained on aspirin and Plavix for CVA prophylaxis x3 weeks and aspirin alone.  Subcutaneous Lovenox for DVT prophylaxis.  Tolerating a regular diet.  Therapy evaluations completed with recommendations of physical medicine rehab consult to the left side weakness and patient was admitted for a comprehensive rehab program.  Review of Systems  Constitutional: Negative for chills and fever.  HENT: Negative for hearing loss.   Eyes: Negative for blurred vision and double vision.  Respiratory: Negative for cough and shortness of breath.   Cardiovascular: Negative for chest pain, palpitations and leg swelling.  Gastrointestinal: Positive for constipation. Negative for heartburn, nausea and  vomiting.  Genitourinary: Negative for dysuria, flank pain and hematuria.  Musculoskeletal: Positive for myalgias.  Skin: Negative for rash.  Neurological: Positive for weakness and headaches.  All other systems reviewed and are negative.  Past Medical History:  Diagnosis Date  . Diabetes mellitus without complication (HCC)   . Hypertension    History reviewed. No pertinent surgical history. History reviewed. No pertinent family history. Social History:  reports that she has never smoked. She has never used smokeless tobacco. She reports previous alcohol use. She reports previous drug use. Allergies: No Known Allergies Medications Prior to Admission  Medication Sig Dispense Refill  . DANDELION PO Take 1 tablet by mouth daily.    . Ferrous Sulfate (IRON PO) Take 1 tablet by mouth daily.    Marland Kitchen. HAWTHORN PO Take 1 tablet by mouth daily.    Marland Kitchen. MAGNESIUM PO Take 1 tablet by mouth daily.    Marland Kitchen. POTASSIUM PO Take 1 tablet by mouth daily. Over the counter    . Turmeric (QC TUMERIC COMPLEX PO) Take 1 tablet by mouth daily.    . vitamin C (ASCORBIC ACID) 500 MG tablet Take 1,000 mg by mouth daily.      Drug Regimen Review Drug regimen was reviewed and remains appropriate with no significant issues identified  Home: Home Living Family/patient expects to be discharged to:: Private residence Living Arrangements: Other relatives (brother, sister, and sister-in-law) Available Help at Discharge: Family,Available 24 hours/day Type of Home: House Home Access: Stairs to enter Entergy CorporationEntrance Stairs-Number of Steps: 2 Entrance Stairs-Rails: Right,Left,Can reach both Home Layout: Multi-level,Bed/bath upstairs Alternate Level Stairs-Number of Steps: full flight Alternate Level Stairs-Rails: Left Bathroom Shower/Tub: Health visitorWalk-in shower Bathroom Toilet: Handicapped height  Home Equipment: Hand held shower head,Shower seat - built in Additional Comments: typically uses tub shower in her room. Could potentially  have access to walkin shower with built in seat in brother's bathroom.   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 Pt drives.  Functional Status:  Mobility: Bed Mobility Overal bed mobility: Needs Assistance Bed Mobility: Supine to Sit Rolling: Min guard (partial roll to come to EOB) Sidelying to sit: Mod assist Supine to sit: Min guard General bed mobility comments: Pt able to transition to sit R EOB with use of bed rails and extra time and effort, min guard for safety cuing for L arm and leg management. Transfers Overall transfer level: Needs assistance Equipment used: Rolling walker (2 wheeled) Transfers: Sit to/from Stand Sit to Stand: Min assist Stand pivot transfers: Min assist General transfer comment: Cued pt to push up from bed and transition hands to RW, minA for steadying and extra time to power up to stand. No overt LOB. Assistance for L hand placement on RW. Ambulation/Gait Ambulation/Gait assistance: Min assist,Mod assist,+2 safety/equipment Gait Distance (Feet): 30 Feet Assistive device: Rolling walker (2 wheeled) Gait Pattern/deviations: Step-through pattern,Decreased step length - right,Decreased step length - left,Decreased stride length,Staggering left General Gait Details: Pt ambulating with slight L knee instability but no appreciative buckling, providing tactile cues intermittently at L quad during stance. During initial few steps pt had a sudden LOB to the L needing modA to recover safely. Cued pt to only move RW or 1 foot at a time for safety, especially when turning, and to keep RW in contact with ground, minAx1-2 for safety rest of gait bout. Tends to drift and stagger to L, thus blocked pt with PT's body on L throughout for safety and aide posterior to pt for further support. Gait velocity: reduced Gait velocity interpretation: <1.31 ft/sec, indicative of household ambulator     ADL: ADL Overall ADL's : Needs assistance/impaired Eating/Feeding: Minimal assistance,Sitting Eating/Feeding Details (indicate cue type and reason): assist for bilateral tasks. Impairment is in nondominant UE Grooming: Moderate assistance,Sitting Upper Body Bathing: Moderate assistance,Sitting Lower Body Bathing: Minimal assistance,Sit to/from stand Upper Body Dressing : Moderate assistance,Sitting Lower Body Dressing: Minimal assistance,Sit to/from stand Toilet Transfer: Minimal assistance Toilet Transfer Details (indicate cue type and reason): steadying assist to take pivotal steps to recliner Toileting- Clothing Manipulation and Hygiene: Minimal assistance,Sit to/from stand Tub/ Shower Transfer: Tub transfer,Minimal assistance Functional mobility during ADLs: Minimal assistance (steadying assist to take pivotal steps to recliner) General ADL Comments: Presents with LUE deficits in strength (3-/5 at all levels) and coordination, possibly some mild L side neglect? impacting UB/LB ADLs. A bit impulsive. steadying assist to take pivtal steps EOB to recliner  Cognition: Cognition Overall Cognitive Status: No family/caregiver present to determine baseline cognitive functioning Arousal/Alertness: Awake/alert Orientation Level: Oriented X4 Attention: Focused,Sustained Focused Attention: Appears intact Sustained Attention: Appears intact Memory: Impaired Memory Impairment: Retrieval deficit,Storage deficit,Decreased recall of new information (Immediate: 5/5; delayed: 4/5; with cue: 1/1) Awareness: Impaired Awareness Impairment: Emergent impairment Problem Solving: Impaired Problem Solving Impairment: Verbal complex Executive Function: Reasoning,Sequencing,Organizing Reasoning: Appears intact Sequencing: Impaired Sequencing Impairment: Verbal complex (clockdrawing: 0/4) Organizing Impairment: Verbal complex (backward digit span: 0/2) Cognition Arousal/Alertness:  Awake/alert Behavior During Therapy: WFL for tasks assessed/performed,Impulsive Overall Cognitive Status: No family/caregiver present to determine baseline cognitive functioning General Comments: Pt able to overall answer questions appropriately, A&Ox4. Pt with poor insight into deficits and safety awareness, needing cues to sequence mobility  with RW for safety.  Physical Exam: Blood pressure (!) 145/90, pulse 76, temperature 98.7 F (37.1 C), temperature source Oral, resp. rate 20, height  (1.6 m), weight 130.6 kg, SpO2 100 %. Physical Exam Constitutional:      General: She is not in acute distress.    Appearance: She is obese. She is not ill-appearing.  HENT:     Head: Normocephalic and atraumatic.     Right Ear: External ear normal.     Left Ear: External ear normal.     Nose: Nose normal.     Mouth/Throat:     Mouth: Mucous membranes are moist.     Pharynx: Oropharynx is clear.  Eyes:     Extraocular Movements: Extraocular movements intact.     Pupils: Pupils are equal, round, and reactive to light.  Cardiovascular:     Rate and Rhythm: Normal rate and regular rhythm.     Heart sounds: No murmur heard. No gallop.   Pulmonary:     Effort: Pulmonary effort is normal. No respiratory distress.     Breath sounds: No wheezing.  Abdominal:     General: Bowel sounds are normal. There is no distension.     Palpations: Abdomen is soft.     Tenderness: There is no abdominal tenderness. There is no guarding.  Musculoskeletal:        General: No swelling or tenderness. Normal range of motion.     Cervical back: Normal range of motion.     Right lower leg: No edema.     Left lower leg: No edema.  Skin:    General: Skin is warm and dry.  Neurological:     Mental Status: She is alert.     Comments: Patient is alert in no acute distress.  Speech is mildly dysarthric but intelligible.  Oriented x3 and follows commands.  Fair awareness of deficits. Left central 7. Motor: LUE 2+  deltoid, biceps, triceps, 1/5 wrist and HI. LLE 4/5 prox to distal. Senses pain and LT in all 4's. No resting tone .DTR's 1+.   Psychiatric:        Mood and Affect: Mood normal.        Behavior: Behavior normal.     Results for orders placed or performed during the hospital encounter of 10/12/20 (from the past 48 hour(s))  HIV Antibody (routine testing w rflx)     Status: None   Collection Time: 10/12/20  6:41 PM  Result Value Ref Range   HIV Screen 4th Generation wRfx Non Reactive Non Reactive    Comment: Performed at [facility], [address], [place], [place] [address]  CBC     Status: None   Collection Time: 10/12/20  6:41 PM  Result Value Ref Range   WBC 6.0 4.0 - 10.5 K/uL   RBC 4.75 3.87 - 5.11 MIL/uL   Hemoglobin 12.4 12.0 - 15.0 g/dL   HCT 60.4 54.0 - 98.1 %   MCV 82.1 80.0 - 100.0 fL   MCH 26.1 26.0 - 34.0 pg   MCHC 31.8 30.0 - 36.0 g/dL   RDW 19.1 47.8 - 29.5 %   Platelets 155 150 - 400 K/uL    Comment: REPEATED TO VERIFY   nRBC 0.0 0.0 - 0.2 %    Comment: Performed at [facility], [address], [place], [place] [address]  Creatinine, serum     Status: None   Collection Time: 10/12/20  6:41 PM  Result  Value Ref Range   Creatinine, Ser 0.76 0.44 - 1.00 mg/dL   GFR, Estimated >16 >10 mL/min    Comment: (NOTE) Calculated using the CKD-EPI Creatinine Equation (2021) Performed at [facility], [address] [place], [place] [address]   Glucose, capillary     Status: None   Collection Time: 10/13/20 12:47 AM  Result Value Ref Range   Glucose-Capillary 97 70 - 99 mg/dL    Comment: Glucose reference range applies only to samples taken after fasting for at least 8 hours.  Hemoglobin A1c     Status: None   Collection Time: 10/13/20  3:48 AM  Result Value Ref Range   Hgb A1c MFr Bld 5.6 4.8 - 5.6 %    Comment: (NOTE)         Prediabetes: 5.7 - 6.4         Diabetes: >6.4         Glycemic control for adults with diabetes: <7.0    Mean Plasma  Glucose 114 mg/dL    Comment: (NOTE) Performed At: [facility] [address] [place], [place] [id number] Jolene Schimke MD NW:[id number]   Lipid panel     Status: Abnormal   Collection Time: 10/13/20  3:48 AM  Result Value Ref Range   Cholesterol 209 (H) 0 - 200 mg/dL   Triglycerides 66 <578 mg/dL   HDL 52 >46 mg/dL   Total CHOL/HDL Ratio 4.0 RATIO   VLDL 13 0 - 40 mg/dL   LDL Cholesterol 962 (H) 0 - 99 mg/dL    Comment:        Total Cholesterol/HDL:CHD Risk Coronary Heart Disease Risk Table                     Men   Women  1/2 Average Risk   3.4   3.3  Average Risk       5.0   4.4  2 X Average Risk   9.6   7.1  3 X Average Risk  23.4   11.0        Use the calculated Patient Ratio above and the CHD Risk Table to determine the patient's CHD Risk.        ATP III CLASSIFICATION (LDL):  <100     mg/dL   Optimal  952-841  mg/dL   Near or Above                    Optimal  130-159  mg/dL   Borderline  324-401  mg/dL   High  >027     mg/dL   Very High Performed at [facility], [address] [place], [place] [address]   Basic metabolic panel     Status: Abnormal   Collection Time: 10/13/20  3:48 AM  Result Value Ref Range   Sodium 139 135 - 145 mmol/L   Potassium 3.9 3.5 - 5.1 mmol/L   Chloride 105 98 - 111 mmol/L   CO2 24 22 - 32 mmol/L   Glucose, Bld 104 (H) 70 - 99 mg/dL    Comment: Glucose reference range applies only to samples taken after fasting for at least 8 hours.   BUN 6 6 - 20 mg/dL   Creatinine, Ser 4.40 0.44 - 1.00 mg/dL   Calcium 9.8 8.9 - 34.7 mg/dL   GFR, Estimated >42 >59 mL/min    Comment: (NOTE) Calculated using the CKD-EPI Creatinine Equation (2021)    Anion gap 10  5 - 15    Comment: Performed at [facility], [address] [place], [place] [address]  Basic metabolic panel     Status: Abnormal   Collection Time: 10/14/20  3:47 AM  Result Value Ref Range   Sodium 138 135 - 145 mmol/L   Potassium 3.2 (L) 3.5 - 5.1 mmol/L    Chloride 104 98 - 111 mmol/L   CO2 23 22 - 32 mmol/L   Glucose, Bld 111 (H) 70 - 99 mg/dL    Comment: Glucose reference range applies only to samples taken after fasting for at least 8 hours.   BUN 5 (L) 6 - 20 mg/dL   Creatinine, Ser 0.98 0.44 - 1.00 mg/dL   Calcium 11.9 8.9 - 14.7 mg/dL   GFR, Estimated >82 >95 mL/min    Comment: (NOTE) Calculated using the CKD-EPI Creatinine Equation (2021)    Anion gap 11 5 - 15    Comment: Performed at [facility], [address] [place], [place] [address]   CT ANGIO NECK W OR WO CONTRAST  Result Date: 10/13/2020 CLINICAL DATA:  Acute infarct on MRI EXAM: CT ANGIOGRAPHY HEAD AND NECK TECHNIQUE: Multidetector CT imaging of the head and neck was performed using the standard protocol during bolus administration of intravenous contrast. Multiplanar CT image reconstructions and MIPs were obtained to evaluate the vascular anatomy. Carotid stenosis measurements (when applicable) are obtained utilizing NASCET criteria, using the distal internal carotid diameter as the denominator. CONTRAST:  75mL OMNIPAQUE IOHEXOL 350 MG/ML SOLN COMPARISON:  10/12/2020 FINDINGS: CT HEAD Brain: There is no acute intracranial hemorrhage or mass effect. There is subtle low-attenuation in the right corona radiata corresponding to infarction on MRI. Chronic infarct of the left basal ganglia and adjacent white matter. Additional patchy and confluent areas of hypoattenuation in the supratentorial white likely reflect age advanced chronic microvascular ischemic changes. Ventricles are stable size. There is no extra-axial fluid collection. Vascular: No new findings. Skull: Calvarium is unremarkable. Sinuses/Orbits: No acute finding. Other: None. Review of the MIP images confirms the above findings CTA NECK Aortic arch: Great vessel origins are patent. Right carotid system: Patent. No measurable stenosis at the ICA origin. Left carotid system: Patent. No measurable stenosis at the ICA  origin. Vertebral arteries: Patent and codominant. Skeleton: Mild cervical spine degenerative changes. Other neck: No significant abnormality. Upper chest: No apical lung mass. Review of the MIP images confirms the above findings CTA HEAD Anterior circulation: Intracranial internal carotid arteries patent with atherosclerotic irregularity primarily along the cavernous segments, right greater left. Irregular noncalcified plaque is present the floor of the posterior genu. There is a 3 x 2 mm inferiorly directed aneurysm the distal supraclinoid right ICA. Anterior cerebral arteries are patent. Hypoplastic right A1 ACA. Middle cerebral arteries are patent. There is mild atherosclerotic irregularity of distal branches. Posterior circulation: Intracranial vertebral arteries, basilar artery, and posterior cerebral arteries are patent. Venous sinuses: Patent as allowed by contrast bolus timing. Review of the MIP images confirms the above findings IMPRESSION: No acute intracranial hemorrhage. Evolving acute infarction of the corona radiata better seen on MRI. No hemodynamically significant stenosis in the neck. Right greater than left intracranial internal carotid atherosclerosis. Irregular noncalcified plaque the floor of the posterior genu. Mild atherosclerotic irregularity of distal MCA branches bilaterally. 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 Electronically Signed   By: Guadlupe Spanish M.D.   On: 10/13/2020 14:05   MR Brain Wo Contrast (neuro protocol)  Addendum Date: 10/12/2020   ADDENDUM REPORT: 10/12/2020 15:32 ADDENDUM: These results were called by telephone on 10/12/2020 at 3:30 pm to provider [facility] , who verbally acknowledged these results. Electronically Signed   By: Baldemar Lenis M.D.   On: 10/12/2020 15:32   Result Date: 10/12/2020 CLINICAL DATA:  Neuro deficit, acute, stroke suspected. EXAM: MRI HEAD WITHOUT CONTRAST TECHNIQUE: Multiplanar, multiecho pulse sequences of  the brain and surrounding structures were obtained without intravenous contrast. COMPARISON:  Head CT October 12, 2020 FINDINGS: Brain: Area of restricted diffusion involving the posterior limb of internal capsule on the right extending into the corona radiata, consistent with acute/subacute infarct. Remote infarct is seen in the left basal ganglia region and left side of the pons. Scattered confluent foci of T2 hyperintensity are seen within the white matter of the cerebral hemispheres, nonspecific, more pronounced than expected for patient's age. No hemorrhage, hydrocephalus, extra-axial collection or mass lesion. Vascular: Normal flow voids. Skull and upper cervical spine: Normal marrow signal. Sinuses/Orbits: Mucous retention cyst in the left maxillary sinus. The orbits are maintained. IMPRESSION: 1. Area of restricted diffusion involving the posterior limb of internal capsule on the right extending into the corona radiata, consistent with acute/subacute infarct. 2. Remote infarct in the left basal ganglia region and left side of the pons. 3. Scattered confluent foci of T2 hyperintensity are seen within the white matter of the cerebral hemispheres, nonspecific, more pronounced than expected for patient's age. Findings may represent chronic microvascular ischemic changes. Electronically Signed: By: Baldemar Lenis M.D. On: 10/12/2020 15:22   ECHOCARDIOGRAM COMPLETE  Result Date: 10/13/2020    ECHOCARDIOGRAM REPORT   Patient Name:   Shannon Garza Date of Exam: 10/13/2020 Medical Rec #:  [id number]         Height:       63.0 in Accession #:    [id number]        Weight:       288.0 lb Date of Birth:  1966/08/09          BSA:          2.258 m Patient Age:    54 years          BP:           181/67 mmHg Patient Gender: F                 HR:           54 bpm. Exam Location:  Inpatient Procedure: 2D Echo, Cardiac Doppler and Color Doppler Indications:    CVA  History:        Patient has no prior  history of Echocardiogram examinations.                 Stroke; Risk Factors:Hypertension, Diabetes, Dyslipidemia, Sleep                 Apnea and Morbid obesity.  Sonographer:    Lavenia Atlas Referring Phys: [phone] ANKIT NANAVATI  Sonographer Comments: Patient is morbidly obese and Technically difficult study due to poor echo windows. Image acquisition challenging due to patient body habitus. Hard to hold breast up for apical windows patient too obese!!! IMPRESSIONS  1. Left ventricular ejection fraction, by estimation, is 60 to 65%. The left ventricle has normal function. The left ventricle has no regional wall motion abnormalities. There is moderate left ventricular hypertrophy. Left ventricular diastolic parameters are indeterminate.  2. Right ventricular systolic function was not well visualized.  The right ventricular size is not well visualized. Tricuspid regurgitation signal is inadequate for assessing PA pressure.  3. The mitral valve is normal in structure. No evidence of mitral valve regurgitation.  4. The aortic valve is tricuspid. Aortic valve regurgitation is not visualized. No aortic stenosis is present. FINDINGS  Left Ventricle: Left ventricular ejection fraction, by estimation, is 60 to 65%. The left ventricle has normal function. The left ventricle has no regional wall motion abnormalities. The left ventricular internal cavity size was normal in size. There is  moderate left ventricular hypertrophy. Left ventricular diastolic parameters are indeterminate. Right Ventricle: The right ventricular size is not well visualized. Right vetricular wall thickness was not well visualized. Right ventricular systolic function was not well visualized. Tricuspid regurgitation signal is inadequate for assessing PA pressure. Left Atrium: Left atrial size was normal in size. Right Atrium: Right atrial size was not well visualized. Pericardium: Trivial pericardial effusion is present. Mitral Valve: The mitral valve  is normal in structure. No evidence of mitral valve regurgitation. Tricuspid Valve: The tricuspid valve is normal in structure. Tricuspid valve regurgitation is trivial. Aortic Valve: The aortic valve is tricuspid. Aortic valve regurgitation is not visualized. No aortic stenosis is present. Pulmonic Valve: The pulmonic valve was not well visualized. Pulmonic valve regurgitation is not visualized. Aorta: The aortic root and ascending aorta are structurally normal, with no evidence of dilitation. IAS/Shunts: The interatrial septum was not well visualized.  LEFT VENTRICLE PLAX 2D LVIDd:         4.20 cm  Diastology LVIDs:         2.80 cm  LV e' medial:    6.53 cm/s LV PW:         1.50 cm  LV E/e' medial:  9.6 LV IVS:        1.40 cm  LV e' lateral:   4.68 cm/s LVOT diam:     2.30 cm  LV E/e' lateral: 13.5 LV SV:         59 LV SV Index:   26 LVOT Area:     4.15 cm  RIGHT VENTRICLE RV S prime:     5.98 cm/s LEFT ATRIUM             Index       RIGHT ATRIUM           Index LA diam:        4.80 cm 2.13 cm/m  RA Area:     22.30 cm LA Vol (A2C):   45.7 ml 20.24 ml/m RA Volume:   69.40 ml  30.74 ml/m LA Vol (A4C):   67.4 ml 29.85 ml/m LA Biplane Vol: 58.5 ml 25.91 ml/m  AORTIC VALVE LVOT Vmax:   80.30 cm/s LVOT Vmean:  48.400 cm/s LVOT VTI:    0.142 m  AORTA Ao Root diam: 3.00 cm MITRAL VALVE MV Area (PHT): 2.48 cm    SHUNTS MV Decel Time: 306 msec    Systemic VTI:  0.14 m MV E velocity: 63.00 cm/s  Systemic Diam: 2.30 cm MV A velocity: 65.10 cm/s MV E/A ratio:  0.97 Epifanio Lesches MD Electronically signed by Epifanio Lesches MD Signature Date/Time: 10/13/2020/4:53:27 PM    Final    CT ANGIO HEAD CODE STROKE  Result Date: 10/13/2020 CLINICAL DATA:  Acute infarct on MRI EXAM: CT ANGIOGRAPHY HEAD AND NECK TECHNIQUE: Multidetector CT imaging of the head and neck was performed using the standard protocol during bolus administration of intravenous contrast. Multiplanar CT image  reconstructions and MIPs were  obtained to evaluate the vascular anatomy. Carotid stenosis measurements (when applicable) are obtained utilizing NASCET criteria, using the distal internal carotid diameter as the denominator. CONTRAST:  30mL OMNIPAQUE IOHEXOL 350 MG/ML SOLN COMPARISON:  10/12/2020 FINDINGS: CT HEAD Brain: There is no acute intracranial hemorrhage or mass effect. There is subtle low-attenuation in the right corona radiata corresponding to infarction on MRI. Chronic infarct of the left basal ganglia and adjacent white matter. Additional patchy and confluent areas of hypoattenuation in the supratentorial white likely reflect age advanced chronic microvascular ischemic changes. Ventricles are stable size. There is no extra-axial fluid collection. Vascular: No new findings. Skull: Calvarium is unremarkable. Sinuses/Orbits: No acute finding. Other: None. Review of the MIP images confirms the above findings CTA NECK Aortic arch: Great vessel origins are patent. Right carotid system: Patent. No measurable stenosis at the ICA origin. Left carotid system: Patent. No measurable stenosis at the ICA origin. Vertebral arteries: Patent and codominant. Skeleton: Mild cervical spine degenerative changes. Other neck: No significant abnormality. Upper chest: No apical lung mass. Review of the MIP images confirms the above findings CTA HEAD Anterior circulation: Intracranial internal carotid arteries patent with atherosclerotic irregularity primarily along the cavernous segments, right greater left. Irregular noncalcified plaque is present the floor of the posterior genu. There is a 3 x 2 mm inferiorly directed aneurysm the distal supraclinoid right ICA. Anterior cerebral arteries are patent. Hypoplastic right A1 ACA. Middle cerebral arteries are patent. There is mild atherosclerotic irregularity of distal branches. Posterior circulation: Intracranial vertebral arteries, basilar artery, and posterior cerebral arteries are patent. Venous sinuses:  Patent as allowed by contrast bolus timing. Review of the MIP images confirms the above findings IMPRESSION: No acute intracranial hemorrhage. Evolving acute infarction of the corona radiata better seen on MRI. No hemodynamically significant stenosis in the neck. Right greater than left intracranial internal carotid atherosclerosis. Irregular noncalcified plaque the floor of the posterior genu. Mild atherosclerotic irregularity of distal MCA branches bilaterally. 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 Electronically Signed   By: Guadlupe Spanish M.D.   On: 10/13/2020 14:05       Medical Problem List and Plan: 1.  Left hemiparesis secondary to right PLIC/CR as well as history of CVA 2018  -patient may shower  -ELOS/Goals: 12-17 days. mod I to supervision with PT, OT, SLP  -WHO LUE 2.  Antithrombotics: -DVT/anticoagulation: Lovenox  -antiplatelet therapy: Aspirin 81 mg daily and Plavix 75 mg x3 weeks then aspirin alone  3. Pain Management: Tylenol as needed 4. Mood: Provide emotional support  -antipsychotic agents: N/A 5. Neuropsych: This patient is capable of making decisions on her own behalf. 6. Skin/Wound Care: Routine skin checks 7. Fluids/Electrolytes/Nutrition: Routine in and outs with follow-up chemistries 8.  Hypertension.  Lisinopril 20 mg daily.  Monitor with increased mobility 9.  Hyperlipidemia.  Lipitor 10.  Prediabetes.  Hemoglobin A1c 5.6.  CBGs discontinued     Lynnae Prude 10/14/2020

## 2020-10-14 NOTE — PMR Pre-admission (Signed)
PMR Admission Coordinator Pre-Admission Assessment  Patient: Shannon Garza is an 55 y.o., female MRN: [id number] DOB: Apr 19, 1966 Height: 5\' 3"  (160 cm) Weight: 130.6 kg             Insurance Information HMO:     PPO: yes     PCP:      IPA:      80/20:      OTHER:  PRIMARY: [employer] of      Policy#: [place]      Subscriber: pt CM Name: via fax      Phone#: [phone]   Fax#: [phone] Pre-Cert#: [id number]   Approved until 3/2 when updates are due   Employer: [id number] Benefits:  Phone #: [phone]     Name: 2/17 Eff. Date: 08/04/2020     Deduct: $3300      Out of Pocket Max: $6400  CIR: 75% after deductible      SNF: 75% 100 days per year Outpatient: $30 to $45 per visit     Co-Pay: visits per medical neccesity Home Health: 75%      Co-Pay: visits per medcail necceisty DME: 75%     Co-Pay: 25% Providers: in network  SECONDARY: none  Financial Counselor:       Phone#:   The 14/03/2020" for patients in Inpatient Rehabilitation Facilities with attached "Privacy Act Statement-Health Care Records" was provided and verbally reviewed with: N/A  Emergency Contact Information Contact Information    Name Relation Home Work Mobile   [place] Sister [phone]       Current Medical History  Patient Admitting Diagnosis: CVA  History of Present Illness:  55 year old right-handed female with history of prediabetes, CVA 2018 and hypertension.  Patient opted for herbal medicine route rather than taking other medications after CVA.   Sister can provide assistance.  Presented 10/12/2020 with left-sided weakness 10/11/2020.  Noted blood pressure systolic 163-194.  CT/MRI showed area of restricted diffusion involving the posterior limb of internal capsule on the right extending into the corona radiata consistent with acute/subacute infarction.  Remote infarct in left basal ganglia and left side of the pons.  CT angiogram of the head and neck no  hemodynamically significant stenosis in the neck.  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  Echocardiogram with ejection fraction of 60 to 65% no wall motion abnormalities.  Admission chemistries urine drug screen negative potassium 3.2 glucose 126 alcohol negative hemoglobin A1c 5.6.  Currently maintained on aspirin and Plavix for CVA prophylaxis x3 weeks and aspirin alone.  Subcutaneous Lovenox for DVT prophylaxis.  Tolerating a regular diet.    Complete NIHSS TOTAL: 3 Glasgow Coma Scale Score: 15 Past Medical History  Past Medical History:  Diagnosis Date  . Diabetes mellitus without complication (HCC)   . Hypertension    Family History  family history is not on file.  Prior Rehab/Hospitalizations:  Has the patient had prior rehab or hospitalizations prior to admission? Yes  Has the patient had major surgery during 100 days prior to admission? No  Current Medications   Current Facility-Administered Medications:  .  acetaminophen (TYLENOL) tablet 650 mg, 650 mg, Oral, Q4H PRN **OR** acetaminophen (TYLENOL) 160 MG/5ML solution 650 mg, 650 mg, Per Tube, Q4H PRN **OR** acetaminophen (TYLENOL) suppository 650 mg, 650 mg, Rectal, Q4H PRN, 10/13/2020, MD .  aspirin chewable tablet 81 mg, 81 mg, Oral, Daily, Mirian Mo, MD, 81 mg at 10/18/20 1007 .  atorvastatin (LIPITOR) tablet 40 mg,  40 mg, Oral, QHS, Mirian Mo, MD, 40 mg at 10/17/20 2150 .  clopidogrel (PLAVIX) tablet 75 mg, 75 mg, Oral, Daily, Rejeana Brock, MD, 75 mg at 10/18/20 1007 .  enoxaparin (LOVENOX) injection 40 mg, 40 mg, Subcutaneous, Q24H, Mirian Mo, MD, 40 mg at 10/17/20 1812 .  labetalol (NORMODYNE) injection 5 mg, 5 mg, Intravenous, Q2H PRN, Mirian Mo, MD .  lisinopril (ZESTRIL) tablet 20 mg, 20 mg, Oral, Daily, Mirian Mo, MD, 20 mg at 10/18/20 1007 .  milk and molasses enema, 1 enema, Rectal, Once PRN, Fayette Pho, MD .  polyethylene glycol (MIRALAX / GLYCOLAX) packet 17 g, 17  g, Oral, Daily, Paige, Victoria J, DO, 17 g at 10/15/20 0905 .  senna (SENOKOT) tablet 17.2 mg, 2 tablet, Oral, Daily PRN, Westley Chandler, MD, 17.2 mg at 10/16/20 1118  Patients Current Diet:  Diet Order            Diet - low sodium heart healthy           Diet vegetarian Room service appropriate? 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 Pt drives.  Self Care: Did the patient need help bathing, dressing, using the toilet or eating?  Independent  Indoor Mobility: Did the patient need assistance with walking from room to room (with or without device)? Independent  Stairs: Did the patient need assistance with internal or external stairs (with or without device)? Independent  Functional Cognition: Did the patient need help planning regular tasks such as shopping or remembering to take medications? Independent  Home Assistive Devices / Equipment Home Assistive Devices/Equipment: None Home Equipment: Hand held shower head,Shower seat - built in  Prior Device Use: Indicate devices/aids used by the patient prior to current illness, exacerbation or injury? None of the above  Current Functional Level Cognition  Arousal/Alertness: Awake/alert Overall Cognitive Status: Within Functional Limits for tasks assessed Orientation Level: Oriented X4 General Comments: pt following all commands and answers questions appropriately. Did need reminder cues to bring left arm along with her when moving from supine to sitting edge of bed, was advancing left LE without cues. Attention:  Focused,Sustained Focused Attention: Appears intact Sustained Attention: Appears intact Memory: Impaired Memory Impairment: Retrieval deficit,Storage deficit,Decreased recall of new information (Immediate: 5/5; delayed: 4/5; with cue: 1/1) Awareness: Impaired Awareness Impairment: Emergent impairment Problem Solving: Impaired Problem Solving Impairment: Verbal complex Executive Function: Reasoning,Sequencing,Organizing Reasoning: Appears intact Sequencing: Impaired Sequencing Impairment: Verbal complex (clockdrawing: 0/4) Organizing Impairment: Verbal complex (backward digit span: 0/2)    Extremity Assessment (includes Sensation/Coordination)  Upper Extremity Assessment: LUE deficits/detail LUE Deficits / Details: 3-/5 shoulder and distal. benefits from multimodal cues. Impaired gross and fine motor coordination. Impaired proprioception (vs decreased control of movements d/t weakness?). Poor grasp and FM LUE Sensation: decreased proprioception,decreased light touch LUE Coordination: decreased fine motor,decreased gross motor  Lower Extremity Assessment: Defer to PT evaluation LLE Deficits / Details: Slight weakness in L hip flexor with MMT score of 4- compared to 4+ to 5 grossly throughout remaining L leg and grossly throughout R leg LLE  Sensation: WNL LLE Coordination: decreased fine motor,decreased gross motor (dysdiadochokinesia and dysmetria noted)    ADLs  Overall ADL's : Needs assistance/impaired Eating/Feeding: Minimal assistance,Sitting Eating/Feeding Details (indicate cue type and reason): assist for bilateral tasks. Impairment is in nondominant UE Grooming: Moderate assistance,Sitting Upper Body Bathing: Moderate assistance,Sitting Lower Body Bathing: Minimal assistance,Sit to/from stand Upper Body Dressing : Moderate assistance,Sitting Lower Body Dressing: Minimal assistance,Sit to/from stand Toilet Transfer: Minimal Designer, television/film setassistance,Ambulation Toilet Transfer Details  (indicate cue type and reason): Min A +2 for power up and then to position hands on RW Toileting- Clothing Manipulation and Hygiene: Minimal assistance,Sit to/from stand Tub/ Shower Transfer: Tub transfer,Minimal assistance Functional mobility during ADLs: Moderate assistance,+2 for safety/equipment,+2 for physical assistance,Rolling walker General ADL Comments: Pt performing functional mobility in hallway with Mod A +2 and RW. Cues for sequence and smooth movement patterns during mobility. Providing support at L hand and elbow to optimize WB at LUE    Mobility  Overal bed mobility: Needs Assistance Bed Mobility: Supine to Sit Rolling: Min guard (partial roll to come to EOB) Sidelying to sit: Mod assist Supine to sit: Min assist,HOB elevated General bed mobility comments: with HOB ~30 degrees cues on technique and use of right UE to elevate trunk into sitting. pt able to advance bil LE's to/off edge of bed. assist/cues to bring left shoulder/arm forward with transition to sitting at edge of bed.    Transfers  Overall transfer level: Needs assistance Equipment used: Rolling walker (2 wheeled) Transfers: Sit to/from Stand Sit to Stand: Min assist Stand pivot transfers: Min assist General transfer comment: min assist to power up into standing with cues/faciliation for anterior weight shifting and hand placement (pt with left UE on RW and right UE pushing from bed).    Ambulation / Gait / Stairs / Wheelchair Mobility  Ambulation/Gait Ambulation/Gait assistance: Editor, commissioningMin assist Gait Distance (Feet): 6 Feet Assistive device: Rolling walker (2 wheeled) Gait Pattern/deviations: Step-through pattern,Decreased step length - right,Decreased step length - left,Decreased stride length,Decreased dorsiflexion - left,Trunk flexed General Gait Details: from bed to recliner. distance limited this session due to second person assist not available. pt with minimal left LE instability noted with the short  distance. Does continue to have poor foot clearance. May benefit from hand orthotic for left UE until grip strength improved. Will defer this to OT/CIR. Gait velocity: reduced Gait velocity interpretation: <1.31 ft/sec, indicative of household ambulator    Posture / Balance Dynamic Sitting Balance Sitting balance - Comments: Static sitting EOB, supervision for safety. Balance Overall balance assessment: Needs assistance Sitting-balance support: No upper extremity supported,Feet supported Sitting balance-Leahy Scale: Fair Sitting balance - Comments: Static sitting EOB, supervision for safety. Standing balance support: Bilateral upper extremity supported,During functional activity Standing balance-Leahy Scale: Poor Standing balance comment: Reliant on UE support and min A majority of mobility for stability.    Special needs/care consideration Designated visitor is sister, Wynelle ClevelandLavina   Previous Home Environment  Living Arrangements:  (lives with brother and sister. Recent move 11/21 from [place])  Lives With: Family Available Help at Discharge: Family,Available 24 hours/day Type of Home: House Home Layout: Multi-level,Bed/bath upstairs Alternate Level Stairs-Rails: Left Alternate Level Stairs-Number of Steps: full flight Home Access: Stairs to enter Entrance Stairs-Rails: Right,Left,Can reach both Entrance Stairs-Number of Steps: 2 Bathroom Shower/Tub: Health visitorWalk-in shower Bathroom Toilet: Handicapped height Bathroom Accessibility: Yes How Accessible: Accessible via walker Home Care Services: No Additional Comments: typically uses tub shower in her room. Could potentially have access to walkin shower with built in  seat in brother's bathroom.  Discharge Living Setting Plans for Discharge Living Setting: Lives with (comment) (Brother and sister) Type of Home at Discharge: House Discharge Home Layout: Multi-level,Bed/bath upstairs Alternate Level Stairs-Rails: Left Alternate Level Stairs-Number  of Steps: full flight Discharge Home Access: Stairs to enter Entrance Stairs-Rails: Right,Left,Can reach both Entrance Stairs-Number of Steps: 2 Discharge Bathroom Shower/Tub: Walk-in shower,Tub/shower unit Discharge Bathroom Toilet: Handicapped height Discharge Bathroom Accessibility: Yes How Accessible: Accessible via walker Does the patient have any problems obtaining your medications?: No  Social/Family/Support Systems Contact Information: Lavina, sister Anticipated Caregiver: Lavina Anticipated Caregiver's Contact Information: see above Ability/Limitations of Caregiver: supervision level Caregiver Availability: 24/7 Discharge Plan Discussed with Primary Caregiver: Yes Is Caregiver In Agreement with Plan?: Yes Does Caregiver/Family have Issues with Lodging/Transportation while Pt is in Rehab?: No  Goals Patient/Family Goal for Rehab: Mod I to supervision with PT, OT, and SLP Expected length of stay: ELOS 12 to 17 days Additional Information: Recent move from [place] 06/2020 Pt/Family Agrees to Admission and willing to participate: Yes Program Orientation Provided & Reviewed with Pt/Caregiver Including Roles  & Responsibilities: Yes  Decrease burden of Care through IP rehab admission: n/a  Possible need for SNF placement upon discharge:not anticipated  Patient Condition: This patient's medical and functional status has changed since the consult dated: 10/14/2020 in which the Rehabilitation Physician determined and documented that the patient's condition is appropriate for intensive rehabilitative care in an inpatient rehabilitation facility. See "History of Present Illness" (above) for medical update. Functional changes are: overall mod assist. Patient's medical and functional status update has been discussed with the Rehabilitation physician and patient remains appropriate for inpatient rehabilitation. Will admit to inpatient rehab today.  Preadmission Screen Completed By:  Clois Dupes, RN, 10/18/2020 11:14 AM ______________________________________________________________________   Discussed status with Dr. Riley Kill on 10/18/2020 at  1115 and received approval for admission today.  Admission Coordinator:  Clois Dupes, time 2993 Date 10/18/2020

## 2020-10-14 NOTE — Progress Notes (Addendum)
FMTS Attending Daily Note: [name], MD  Team Pager [phone] Pager [phone]  I have seen and examined this patient, reviewed their chart. I have discussed this patient with the resident. I agree with the resident's findings, assessment and care plan. Edits within note.    Status is: Inpatient  Remains inpatient appropriate because:awaiting CIR    Dispo:  Patient From: Home  Planned Disposition: Inpatient Rehab  Expected discharge date: 10/14/2020  Medically stable for discharge: Yes           Family Medicine Teaching Service Daily Progress Note Intern Pager: [phone]  Patient name: Shannon Garza Medical record number: [id number] Date of birth: [birth year] Age: 55 y.o. Gender: female  Primary Care Provider: Patient, No Pcp Per Consultants: Neuro  Code Status: Full   Pt Overview and Major Events to Date:  2/15- Admitted, found to have R internal capsule stroke   Assessment and Plan: Kris Labordeis a 55 y.o. 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 PMH is significant forhistory of CVA (2018), DM, HTN.  Acute CVA  Presents with left LL and left UL weakness. PMH of CVA 4 yrs ago in which right side and speech were affected. She did not take medication after that stroke, and instead tried herbal remedies.  MRI brain showed area of restricted diffusion involving the posterior limb of internal capsule on the right extending into the corona radiata, consistent with acute/subacute infarct. - Appreciate Neurology consultation and care, will clarify if further carotid imaging needed  - Neuro following:             - reccomends dual antiplatelet thrapy for 3 weeks followed by monotherapy:  aspirin 81 mg and Plavix 75  mg daily              - telemetry monitoring               - frequent neuro checks  - fall precautions  - PT, OT, speech consults: recommended CIR (consult placed), awaiting insurance authorization    Hypertension Systolic ranging 132-440. 184/76 this am. Reports taking Lisinopril in the past but stopped taking it because the way it made her feel.Tried Losartan yesterday and feels it made her sleepy- will try Lisinopril again.This is also likely elevated in the setting of acute stroke. Permissive hypertension for 48 hours after symptoms, which ended yesterday morning. Will aim to slowly reduce BP to normotensive range.  - Labetalol as needed ordered -Switching from Losartan 25mg  to Lisinopril 10mg  per patient request   Diabetes Currently diet controlled at home. Used to take Metformin but PCP took her off of this. BG this am 111 -A1c: 5.6 -Blood glucose on morning labs  Hyperlipidemia  Total cholesterol 209, LDL 144 - started atorvastatin--this was given late in hospital stay due to strong patient preference to stay off of statin therapy (able to discuss over multiple days)   Hypokalemia K+ 3.2  - replaced w/ 40 meq KCl - AM BMP  FEN/GI:Clear liquid diet ordered, she passed the bedside swallow Prophylaxis:Lovenox   Disposition: CIR when bed available   Subjective:  Patient resting comfortably. States that she felt the Losartan 25mg  made her sleepy yesterday evening. Agreed to give Lisinopril another try. She was also agreeable to inpatient rehab.   Objective: Temp:  [98.1 F (36.7 C)-98.5 F (36.9 C)] 98.3 F (36.8 C) (02/17 0422) Pulse Rate:  [54-75] 65 (02/17 0422) Resp:  [16-20] 20 (02/17 0422) BP: (154-185)/(67-88) 184/76 (02/17 0422)  SpO2:  [93 %-100 %] 93 % (02/17 0422) Physical Exam: General: alert, cooperative, NAD Cardiovascular: RRR. 2/6 systolic murmur in RUSB Respiratory: CTAB. Normal WOB Abdomen: soft, obese, non tender Extremities: warm, dry, no edema Neuro:  CN VII deficit with slight left sided facial droop. Other CNs intact  4/5 strength in L upper and lower extremities. Reduced grip strength in L hand    Laboratory: Recent Labs  Lab  10/12/20 1009 10/12/20 1841  WBC 6.4 6.0  HGB 13.4 12.4  HCT 41.5 39.0  PLT 168 155   Recent Labs  Lab 10/12/20 1009 10/12/20 1841 10/13/20 0348 10/14/20 0347  NA 138  --  139 138  K 3.2*  --  3.9 3.2*  CL 101  --  105 104  CO2 26  --  24 23  BUN 8  --  6 5*  CREATININE 0.73 0.76 0.75 0.75  CALCIUM 10.1  --  9.8 10.1  PROT 7.4  --   --   --   BILITOT 0.6  --   --   --   ALKPHOS 68  --   --   --   ALT 13  --   --   --   AST 25  --   --   --   GLUCOSE 126*  --  104* 111*    Imaging/Diagnostic Tests: CT ANGIO NECK W OR WO CONTRAST  No acute intracranial hemorrhage. Evolving acute infarction of the corona radiata better seen on MRI. No hemodynamically significant stenosis in the neck. Right greater than left intracranial internal carotid atherosclerosis. Irregular noncalcified plaque the floor of the posterior genu. Mild atherosclerotic irregularity of distal MCA branches bilaterally. 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 Electronically Signed   By: Guadlupe SpanishPraneil  Patel M.D.   On: 10/13/2020 14:05   ECHOCARDIOGRAM COMPLETE  Result Date: 10/13/2020    ECHOCARDIOGRAM REPORT   Patient Name:   Shannon CoderGWENDOLYN Struckman Date of Exam: 10/13/2020 Medical Rec #:  [id number]         Height:       63.0 in Accession #:    [id number]        Weight:       288.0 lb Date of Birth:  05/31/1966          BSA:          2.258 m Patient Age:    54 years          BP:           181/67 mmHg Patient Gender: F                 HR:           54 bpm. Exam Location:  Inpatient Procedure: 2D Echo, Cardiac Doppler and Color Doppler Indications:    CVA  History:        Patient has no prior history of Echocardiogram examinations.                 Stroke; Risk Factors:Hypertension, Diabetes, Dyslipidemia, Sleep                 Apnea and Morbid obesity.  Sonographer:    Lavenia AtlasBrooke Strickland Referring Phys: [id number] ANKIT NANAVATI  Sonographer Comments: Patient is morbidly obese and Technically difficult study due to poor echo  windows. Image acquisition challenging due to patient body habitus. Hard to hold breast up for apical windows patient too obese!!! IMPRESSIONS  1. Left ventricular  ejection fraction, by estimation, is 60 to 65%. The left ventricle has normal function. The left ventricle has no regional wall motion abnormalities. There is moderate left ventricular hypertrophy. Left ventricular diastolic parameters are indeterminate.  2. Right ventricular systolic function was not well visualized. The right ventricular size is not well visualized. Tricuspid regurgitation signal is inadequate for assessing PA pressure.  3. The mitral valve is normal in structure. No evidence of mitral valve regurgitation.  4. The aortic valve is tricuspid. Aortic valve regurgitation is not visualized. No aortic stenosis is present. FINDINGS  Left Ventricle: Left ventricular ejection fraction, by estimation, is 60 to 65%. The left ventricle has normal function. The left ventricle has no regional wall motion abnormalities. The left ventricular internal cavity size was normal in size. There is  moderate left ventricular hypertrophy. Left ventricular diastolic parameters are indeterminate. Right Ventricle: The right ventricular size is not well visualized. Right vetricular wall thickness was not well visualized. Right ventricular systolic function was not well visualized. Tricuspid regurgitation signal is inadequate for assessing PA pressure. Left Atrium: Left atrial size was normal in size. Right Atrium: Right atrial size was not well visualized. Pericardium: Trivial pericardial effusion is present. Mitral Valve: The mitral valve is normal in structure. No evidence of mitral valve regurgitation. Tricuspid Valve: The tricuspid valve is normal in structure. Tricuspid valve regurgitation is trivial. Aortic Valve: The aortic valve is tricuspid. Aortic valve regurgitation is not visualized. No aortic stenosis is present. Pulmonic Valve: The pulmonic valve was  not well visualized. Pulmonic valve regurgitation is not visualized. Aorta: The aortic root and ascending aorta are structurally normal, with no evidence of dilitation. IAS/Shunts: The interatrial septum was not well visualized.  LEFT VENTRICLE PLAX 2D LVIDd:         4.20 cm  Diastology LVIDs:         2.80 cm  LV e' medial:    6.53 cm/s LV PW:         1.50 cm  LV E/e' medial:  9.6 LV IVS:        1.40 cm  LV e' lateral:   4.68 cm/s LVOT diam:     2.30 cm  LV E/e' lateral: 13.5 LV SV:         59 LV SV Index:   26 LVOT Area:     4.15 cm  RIGHT VENTRICLE RV S prime:     5.98 cm/s LEFT ATRIUM             Index       RIGHT ATRIUM           Index LA diam:        4.80 cm 2.13 cm/m  RA Area:     22.30 cm LA Vol (A2C):   45.7 ml 20.24 ml/m RA Volume:   69.40 ml  30.74 ml/m LA Vol (A4C):   67.4 ml 29.85 ml/m LA Biplane Vol: 58.5 ml 25.91 ml/m  AORTIC VALVE LVOT Vmax:   80.30 cm/s LVOT Vmean:  48.400 cm/s LVOT VTI:    0.142 m  AORTA Ao Root diam: 3.00 cm MITRAL VALVE MV Area (PHT): 2.48 cm    SHUNTS MV Decel Time: 306 msec    Systemic VTI:  0.14 m MV E velocity: 63.00 cm/s  Systemic Diam: 2.30 cm MV A velocity: 65.10 cm/s MV E/A ratio:  0.97 Epifanio Lesches MD Electronically signed by Epifanio Lesches MD Signature Date/Time: 10/13/2020/4:53:27 PM    Final    CT  ANGIO HEAD CODE STROKE  No acute intracranial hemorrhage. Evolving acute infarction of the corona radiata better seen on MRI. No hemodynamically significant stenosis in the neck. Right greater than left intracranial internal carotid atherosclerosis. Irregular noncalcified plaque the floor of the posterior genu. Mild atherosclerotic irregularity of distal MCA branches bilaterally. 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 Electronically Signed   By: Guadlupe Spanish M.D.   On: 10/13/2020 14:05    Cora Collum, DO 10/14/2020, 7:33 AM PGY-1, [facility] FPTS Intern pager: [phone], text pages welcome

## 2020-10-15 LAB — TROPONIN I (HIGH SENSITIVITY): Troponin I (High Sensitivity): 12 ng/L (ref <18)

## 2020-10-15 MED ORDER — POTASSIUM CHLORIDE CRYS ER 20 MEQ PO TBCR
40.0000 meq | EXTENDED_RELEASE_TABLET | Freq: Once | ORAL | Status: AC
  Administered 2020-10-15: 40 meq via ORAL
  Filled 2020-10-15: qty 4
  Filled 2020-10-15: qty 2

## 2020-10-15 MED ORDER — MAGNESIUM CITRATE PO SOLN
0.5000 | Freq: Once | ORAL | Status: AC
  Administered 2020-10-15: 0.5 via ORAL
  Filled 2020-10-15: qty 296

## 2020-10-15 MED ORDER — MILK AND MOLASSES ENEMA
1.0000 | Freq: Once | RECTAL | Status: DC | PRN
  Filled 2020-10-15: qty 240

## 2020-10-15 MED ORDER — MAGNESIUM CITRATE PO SOLN
1.0000 | Freq: Once | ORAL | Status: AC | PRN
  Administered 2020-10-15: 1 via ORAL
  Filled 2020-10-15: qty 296

## 2020-10-15 NOTE — Progress Notes (Addendum)
FMTS Attending Daily Note: [name], MD  Team Pager [phone] Pager [phone]  I have seen and examined this patient, reviewed their chart. I have discussed this patient with the resident. I agree with the resident's findings, assessment and care plan.   Medically stable for discharge to CIR when able.      Family Medicine Teaching Service Daily Progress Note Intern Pager: [phone]  Patient name: Shannon Garza Medical record number: [id number] Date of birth: 1966/04/13 Age: 55 y.o. Gender: female  Primary Care Provider: Patient, No Pcp Per Consultants: Neuro Code Status: Full  Pt Overview and Major Events to Date: 2/15- Admitted, found to have R internal capsule stroke   Assessment and Plan: Shannon Garza a 55 y.o. 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 PMH is significant forhistory of CVA(2018), DM, HTN.  Patient is medically stable for CIR.  Acute CVA  Presents with left LL and left UL weakness. PMH of CVA 54 yrs agoin which right side and speech were affected.She did not take medication after that stroke, and instead tried herbal remedies.MRI brain showedarea of restricted diffusion involving the posterior limb of internal capsule on the right extending into the corona radiata, consistent with acute/subacute infarct. - Neuro following and signed off: - recommends dual antiplatelet thrapy for 3 weeks followed by monotherapy:aspirin 81 mg and Plavix 75 mg daily, then aspirin alone  - neuro also recommended conservative management with regards to carotid imaging - telemetry monitoring   - frequent neuro checks  - fall precautions  - PT, OT, speech consults: recommended CIR (consult placed), awaiting insurance authorization   Chest pain, resolved, patient reports due to enoxaparin, discussed.  Yesterday patient had complaints of a burning chest pain mid sternal / left sided  that does not radiate. Denied SOB, diaphoresis, palpitations. Trop negative. EKG pending. Potentially related to reflux vs MSK but ruling out MI. Today she denies any chest pains. -Continue to monitor  Hypertension Systolic ranging 694-854.Most recent 167/77.Reports taking Lisinopril in the past but stopped taking it because the way it made her feel.Tried Losartan 2/17 and feels it made her sleepy- will try Lisinopril again.This is also likely elevated in the setting of acute stroke.Permissivehypertension no longer present. Will aim to slowly reduce BP to normotensive range.  - Labetalol as needed ordered -Increase lisinopril to 20 mg.   Diabetes Currently diet controlled at home.Used to take Metformin but PCP took her off of this.BGs have been stable while hospitalized. Most recent CBGs 97-111 -A1c: 5.6 -Blood glucose on morning labs  Hyperlipidemia  Total cholesterol 209, LDL 144 - Continue atorvastatin--this was given late in hospital stay due to strong patient preference to stay off of statin therapy (able to discuss over multiple days)   Hypokalemia K+ within normal limits, 3.9 this morning. - BMP every other day  FEN/GI: Thin liquids  Prophylaxis:Lovenox   Disposition: CIR   Subjective:  Patient with no complaints this morning.  States she has not had any further bouts of chest pain.  States she understands the plan at this time and is awaiting to hear if she will be accepted to CIR next week.  Objective: Temp:  [97.8 F (36.6 C)-98.7 F (37.1 C)] 98.7 F (37.1 C) (02/18 2005) Pulse Rate:  [59-77] 77 (02/18 2005) Resp:  [17-22] 18 (02/18 2005) BP: (150-173)/(68-83) 151/72 (02/18 2005) SpO2:  [93 %-100 %] 99 % (02/18 2005) Physical Exam: General: Alert and oriented in no apparent distress Heart: Regular rate and  rhythm  Lungs: CTA bilaterally, no wheezing Abdomen: Bowel sounds present, no abdominal pain Neuro: CN II through XII intact, patient with  reduced strength in left upper extremity (4/5)  versus right (5/5), left lower extremity mildly weaker than right.  Laboratory: Recent Labs  Lab 10/12/20 1009 10/12/20 1841  WBC 6.4 6.0  HGB 13.4 12.4  HCT 41.5 39.0  PLT 168 155   Recent Labs  Lab 10/12/20 1009 10/12/20 1841 10/13/20 0348 10/14/20 0347  NA 138  --  139 138  K 3.2*  --  3.9 3.2*  CL 101  --  105 104  CO2 26  --  24 23  BUN 8  --  6 5*  CREATININE 0.73 0.76 0.75 0.75  CALCIUM 10.1  --  9.8 10.1  PROT 7.4  --   --   --   BILITOT 0.6  --   --   --   ALKPHOS 68  --   --   --   ALT 13  --   --   --   AST 25  --   --   --   GLUCOSE 126*  --  104* 111*     Imaging/Diagnostic Tests: No results found.  Jackelyn Poling, DO 10/15/2020, 10:15 PM PGY-2, [facility] FPTS Intern pager: [phone], text pages welcome

## 2020-10-15 NOTE — Progress Notes (Signed)
Family Medicine Teaching Service Daily Progress Note Intern Pager: [phone]  Patient name: Shannon Garza Medical record number: [id number] Date of birth: [birth year] Age: 55 y.o. Gender: female  Primary Care Provider: Patient, No Pcp Per Consultants: Neuro Code Status: Full  Pt Overview and Major Events to Date: 2/15- Admitted, found to have R internal capsule stroke   Assessment and Plan: Shannon Labordeis a 55 y.o. 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 PMH is significant forhistory of CVA(2018), DM, HTN. Patient is medically stable for CIR.  Acute CVA  Presents with left LL and left UL weakness. PMH of CVA 4 yrs agoin which right side and speech were affected.She did not take medication after that stroke, and instead tried herbal remedies.MRI brain showedarea of restricted diffusion involving the posterior limb of internal capsule on the right extending into the corona radiata, consistent with acute/subacute infarct. - Neuro following and signed off: - reccomends dual antiplatelet thrapy for 3 weeks followed by monotherapy:aspirin   81 mg and Plavix 75 mg daily   - neuro also recommended conservative management with regards to carotid  imaging - telemetry monitoring   - frequent neuro checks  - fall precautions  - PT, OT, speech consults: recommended CIR (consult placed), awaiting insurance authorization   Chest pain Patient complains of a burning chest pain mid sternal / left sided that does not radiate. Denies SOB, diaphoresis, palpitations. Trop negative. EKG pending. Potentially related to reflux vs MSK but ruling out MI.  - f/u EKG   Hypertension Systolic ranging 235-361.443/15 this am.Reports taking Lisinopril in the past but stopped taking it because the way it made her feel.Tried Losartan yesterday and feels it made her sleepy- will try Lisinopril again.This is also  likely elevated in the setting of acute stroke.Permissivehypertension for 48 hours after symptoms, which ended yesterday morning. Will aim to slowly reduce BP to normotensive range.  - Labetalol as needed ordered -Con't Lisinopril 10mg    Diabetes Currently diet controlled at home.Used to take Metformin but PCP took her off of this.BGs have been stable while hospitalized. -A1c: 5.6 -Blood glucose on morning labs  Hyperlipidemia  Total cholesterol 209, LDL 144 - started atorvastatin--this was given late in hospital stay due to strong patient preference to stay off of statin therapy (able to discuss over multiple days)   Hypokalemia K+ 3.2yesterday and replaced w/ 40 meq KCl - BMP qod  FEN/GI:Clear liquid diet ordered, she passed the bedside swallow Prophylaxis:Lovenox   Disposition: CIR   Subjective:  No acute events overnight. Endorses burning, non radiating chest pain overnight and this morning that she believes is due to her Lovenox injections. Denies SOB, diaphoresis, palpitations. Patient also endorses constipation and has tried Senna and Miralax without relief. States she uses an herbal supplement with mag and citrate at home and I offered this here which she was agreeable to.  Objective: Temp:  [97.9 F (36.6 C)-98.7 F (37.1 C)] 97.9 F (36.6 C) (02/18 0347) Pulse Rate:  [59-76] 76 (02/18 0347) Resp:  [17-20] 17 (02/18 0347) BP: (145-168)/(68-90) 152/68 (02/18 0347) SpO2:  [97 %-100 %] 100 % (02/18 0347) Physical Exam: General: alert, pleasant, NAD Cardiovascular: RRR 2/6 systolic murmur in RUSB  Respiratory: CTAB. Normal WOB Abdomen: soft, non distended Extremities: warm, dry. Well perfused   Laboratory: Recent Labs  Lab 10/12/20 1009 10/12/20 1841  WBC 6.4 6.0  HGB 13.4 12.4  HCT 41.5 39.0  PLT 168 155   Recent Labs  Lab 10/12/20  1009 10/12/20 1841 10/13/20 0348 10/14/20 0347  NA 138  --  139 138  K 3.2*  --  3.9 3.2*  CL 101  --  105  104  CO2 26  --  24 23  BUN 8  --  6 5*  CREATININE 0.73 0.76 0.75 0.75  CALCIUM 10.1  --  9.8 10.1  PROT 7.4  --   --   --   BILITOT 0.6  --   --   --   ALKPHOS 68  --   --   --   ALT 13  --   --   --   AST 25  --   --   --   GLUCOSE 126*  --  104* 111*     Imaging/Diagnostic Tests: No results found.  Cora Collum, DO 10/15/2020, 7:06 AM PGY-1, [facility] FPTS Intern pager: [phone], text pages welcome

## 2020-10-15 NOTE — Progress Notes (Signed)
A clinical question can you do of sleep small bowel movement in moderate since she is out of town for work you okay STROKE TEAM PROGRESS NOTE   INTERVAL HISTORY Pt is evaluated at the bedside. No family present in the room.   Pt states she is interested in enrolling in Obstructive sleep apnea research study. Attending to discuss enrollment with her later today or tomorrow. On Neuro exam, she is alert and oriented x4. Mild Ptosis of Lt eye (from last stroke), weakness in Left hand, and subtle weakness in Lt leg observed. Lt Leg weakness has improved. Her BP has been running high with systolic of 154 to 185 and diastolic of 73-88 mmHg. She has been Afebrile.  HBA1c is 5.6. PT/OT Eval recommended CIR. ECHO showed LVEF of 60-65%. CTA Head and Neck showed No acute intracranial hemorrhage. Evolving acute infarction of the corona radiata. Right greater than left intracranial internal carotid atherosclerosisand 3 x 2 mm aneurysm of the distal supraclinoid right ICA. Educated pt about controlling high risk factors such as HTN, DM and continuing DAPT. Pt agrees with plan. Pt on DAPT (Aspirin and Plavix). Recommend continuing Aspirin and Plavix for 3 weeks and then Aspirin alone.  No more neurological interventions needed. Neurology will sign off.   Vitals:   10/14/20 2307 10/15/20 0347 10/15/20 0809 10/15/20 1153  BP: (!) 159/82 (!) 152/68 (!) 150/74 (!) 173/83  Pulse: (!) 59 76 66 66  Resp: 17 17 (!) 22 20  Temp: 98 F (36.7 C) 97.9 F (36.6 C) 97.8 F (36.6 C) 98.2 F (36.8 C)  TempSrc: Oral Oral Oral Oral  SpO2: 97% 100% 93% 96%  Weight:      Height:       CBC:  Recent Labs  Lab 10/12/20 1009 10/12/20 1841  WBC 6.4 6.0  NEUTROABS 4.9  --   HGB 13.4 12.4  HCT 41.5 39.0  MCV 81.9 82.1  PLT 168 155   Basic Metabolic Panel:  Recent Labs  Lab 10/13/20 0348 10/14/20 0347  NA 139 138  K 3.9 3.2*  CL 105 104  CO2 24 23  GLUCOSE 104* 111*  BUN 6 5*  CREATININE 0.75 0.75  CALCIUM 9.8  10.1   Lipid Panel:  Recent Labs  Lab 10/13/20 0348  CHOL 209*  TRIG 66  HDL 52  CHOLHDL 4.0  VLDL 13  LDLCALC 539*   HgbA1c:  Recent Labs  Lab 10/13/20 0348  HGBA1C 5.6   Urine Drug Screen:  Recent Labs  Lab 10/12/20 0955  LABOPIA NONE DETECTED  COCAINSCRNUR NONE DETECTED  LABBENZ NONE DETECTED  AMPHETMU NONE DETECTED  THCU NONE DETECTED  LABBARB NONE DETECTED    Alcohol Level  Recent Labs  Lab 10/12/20 1010  ETH <10    IMAGING past 24 hours No results found.  PHYSICAL EXAM GENERAL: Pleasant middle-age obese African-American lady awake, alert in NAD HEENT: - Normocephalic and atraumatic LUNGS - Symmetrical chest rise, No labored breathing noted CV - no JVD, No Peripheral Edema ABDOMEN - Soft,  nondistended  Ext: warm, well perfused, intact peripheral pulses, no Peripheral edema.  NEURO Exam:   Mental Status: AA&Ox4.  Oriented to self, age, place, situation. Good Attention and Concentration Language: speech is fluent.  fluency, and comprehension intact. Cranial Nerves:   CN II Pupils equal and reactive to light, no VF deficits    CN III,IV,VI EOM intact, no gaze preference or deviation, no nystagmus    CN V normal sensation in V1, V2,  and V3 segments bilaterally    CN VII Mild Left facial droop, no nasolabial fold flattening Mild ptosis in left eye (from previous stroke)    CN VIII normal hearing to speech    CN IX & X normal palatal elevation, no uvular deviation    CN XI 5/5 head turn and 5/5 shoulder shrug bilaterally    CN XII midline tongue protrusion    Motor: Drift in Left Upper Extremity, No Drift in LE's. Strength 5/5 in Rt UE, 4/5 in Lt UE, Strength in Rt LE  5/5, Lt LE 4+/5 (improving).  Weakness of left grip and intrinsic hand muscles.  Orbits right over left upper extremity. Planters- Down going Tone: is normal and bulk is normal. Sensation- Intact to light touch bilaterally. Coordination: FTN intact bilaterally, no ataxia. Gait-  deferred NIH stroke scale 2 1 for facial weakness and 1 for left upper extremity drift.   Premorbid modified Rankin score 0  ASSESSMENT/PLAN [name] is a 55 y.o. female with history of  previous stroke, diabetes and hypertension which are untreated due to the patient's pursuing a naturopathic approach. She presented with some mild left leg weakness which worsened overnight and included her arm and this prompted her to seek care. CT Head showed chronic small-vessel infarct within the left basal ganglia and internal capsule. MRI showed Area of restricted diffusion involving the posterior limb of internal capsule on the right extending into the corona radiata, consistent with acute/subacute infarct and Remote infarct in the left basal ganglia. ECHO showed LVEF of 60-65%. CTA Head and Neck showed No acute intracranial hemorrhage. Evolving acute infarction of the corona radiata. Right greater than left intracranial internal carotid atherosclerosisand 3 x 2 mm aneurysm of the distal supraclinoid right ICA. Pt was given loading dose of Aspirin and Plavix and then started on DAPT (Aspirin and Plavix). Recommend continuing Aspirin and Plavix for 3 weeks and then Aspirin alone.  No more neurological interventions needed. Neurology will sign off.   Stroke Acute/subacute infarct of posterior limb of internal capsule on the right extending into the corona radiata, Etiology most likely secondary to small vessel disease due to risk factors of HTN, obesity, at risk for sleep apnea and DM.    CT Code Stroke- No acute intracranial abnormality. Chronic small-vessel infarct within the left basal ganglia and        internal capsule. Chronic right basal ganglia lacunar infarct. Advanced cerebral white matter disease, nonspecific        but  most often secondary to chronic small vessel ischemia. Partially empty sella turcica. This finding is very                  Commonly.        incidental, but can be  associated with idiopathic intracranial hypertension.   CTA head and Neck-  No acute intracranial hemorrhage. Evolving acute infarction of the corona radiata. Right greater than left intracranial internal carotid atherosclerosisand 3 x 2 mm aneurysm of the distal supraclinoid right ICA.  CT perfusion - Not ordered.  MRI - Area of restricted diffusion involving the posterior limb of  internal capsule on the right extending into the corona radiata, consistent with acute/subacute infarct. Remote infarct in the left basal ganglia region and left side of the pons. chronic microvascular ischemic changes.  MRA  Not ordered  Carotid Doppler  Not ordered  2D Echo - LVEF 60-65%  LDL 144  HgbA1c No results found for requested labs within last  10626 hours.  VTE prophylaxis - Lovenox    Diet   Diet regular Room service appropriate? Yes; Fluid consistency: Thin    No antithrombotic prior to admission, now on aspirin 81 mg daily and clopidogrel 75 mg daily. (Continue DAPT for 3 weeks and then Aspirin alone)  Therapy recommendations:  CIR  Disposition:  Neurology will sign off.   Hypertension  Home meds:  None  Unstable . Permissive hypertension (OK if < 220/120) but gradually normalize in 5-7 days . Long-term BP goal normotensive  Hyperlipidemia  Home meds:  None,   LDL 144, goal < 70  Cholesterol 209, HDL 52  Rec statin at discharge  Diabetes type II Controlled  Home meds:  None  HgbA1c No results found for requested labs within last 94854 hours., goal < 7.0  CBGs Recent Labs    10/13/20 0047 10/14/20 1630  GLUCAP 97 100*     Obstructive sleep Apnea?  Talked to Pt about the possibility of Obstructive sleep apnea and need for Sleep study.  Attending to discuss enrollment in Research Sleep Study for OSA.   Other Stroke Risk Factors   Obesity, Body mass index is 51.02 kg/m., BMI >30 associated with increased stroke risk, recommend weight loss, diet and exercise  as appropriate.    Hx stroke in 2018- Not on Aspirin and Plavix. Taking Herbal supplements instead.   Family hx stroke (Grandmother)   Hospital day # 3 Patient states she is doing well.  Still has mild left-sided weakness.  CT angiogram shows no significant large vessel stenosis or occlusion but shows 2 to 3 mm terminal right internal carotid artery aneurysm.  This can be managed conservatively.  Patient does not have any significant risk factors which puts her at high risk in the form of family history, prior enzymes of smoking control hypertension.  Patient is agreeable to considering participation in the sleep sleep smart study today.  Recommend aspirin Plavix for 3 weeks followed by aspirin alone and aggressive risk factor modification.  Patient is interested in participating in the sleep smart stroke prevention study but wants to wait till Monday to participate.  Stroke team will sign off.  Kindly call for questions. Delia Heady, MD   To contact Stroke Continuity provider, please refer to [email or web address]. After hours, contact General Neurology

## 2020-10-15 NOTE — Plan of Care (Signed)

## 2020-10-15 NOTE — Plan of Care (Signed)

## 2020-10-15 NOTE — Progress Notes (Signed)
SLP Cancellation Note  Patient Details Name: Shannon Garza MRN: [id number] DOB: 14-Oct-1965   Cancelled treatment:       Reason Eval/Treat Not Completed: Patient at procedure or test/unavailable (Pt with staff at this time. SLP will follow up.)  Shannon Garza I. Vear Clock, MS, CCC-SLP Acute Rehabilitation Services Office number [phone] Pager [phone]   Shannon Garza 10/15/2020, 5:08 PM

## 2020-10-15 NOTE — Progress Notes (Signed)
Inpatient Rehabilitation Admissions Coordinator  I await insurance approval and bed availability to admit her to CIR. I met with patient at bedside and she is aware. I will follow up on Monday.  Danne Baxter, RN, MSN Rehab Admissions Coordinator [phone] 10/15/2020 2:09 PM

## 2020-10-15 NOTE — Progress Notes (Signed)
Occupational Therapy Treatment Patient Details Name: Shannon Garza MRN: [id number] DOB: 1966-07-22 Today's Date: 10/15/2020    History of present illness Pt is a 55 y.o. female who presents with acute L-sided limb and facial weakness. MRI revealed acute/subacute infarct of R posterior internal capsule and remote infarct of L basal ganglia and L pons. Outside window for tPA. PMH: CVA 4 years ago, HTN, and DM2.   OT comments  Pt progressing towards established OT goals. Session completed with PT for mobility in hallway to optimize safety and then completing LUE exercises. Pt performing mobility in hallway with Mod A +2 and providing support LUE for increasing weight bearing. Pt performing LUE AROM and therapeutic activity for targeted reach at LUE. Continue to recommend dc to CIR and will continue to follow acutely as admitted.    Follow Up Recommendations  CIR;Supervision/Assistance - 24 hour    Equipment Recommendations  3 in 1 bedside commode    Recommendations for Other Services PT consult;Rehab consult    Precautions / Restrictions Precautions Precautions: Fall       Mobility Bed Mobility               General bed mobility comments: In room with PT upon arrival  Transfers Overall transfer level: Needs assistance Equipment used: Rolling walker (2 wheeled) Transfers: Sit to/from Stand Sit to Stand: Min assist         General transfer comment: Min A to power up into standing. Cues for hand placement and facilitating position of LUE for weight bearing and safety    Balance Overall balance assessment: Needs assistance Sitting-balance support: No upper extremity supported;Feet supported Sitting balance-Leahy Scale: Fair     Standing balance support: Bilateral upper extremity supported;During functional activity Standing balance-Leahy Scale: Poor Standing balance comment: Reliant on UE support and min A majority of mobility for stability.                            ADL either performed or assessed with clinical judgement   ADL Overall ADL's : Needs assistance/impaired                         Toilet Transfer: Minimal assistance;Ambulation Toilet Transfer Details (indicate cue type and reason): Min A +2 for power up and then to position hands on RW         Functional mobility during ADLs: Moderate assistance;+2 for safety/equipment;+2 for physical assistance;Rolling walker General ADL Comments: Pt performing functional mobility in hallway with Mod A +2 and RW. Cues for sequence and smooth movement patterns during mobility. Providing support at L hand and elbow to optimize WB at LUE     Vision       Perception     Praxis      Cognition Arousal/Alertness: Awake/alert Behavior During Therapy: [facility] for tasks assessed/performed;Impulsive Overall Cognitive Status: Impaired/Different from baseline Area of Impairment: Problem solving;Awareness                           Awareness: Emergent Problem Solving: Requires verbal cues General Comments: Pt requiring increased time throughout. Follwoing simple cues with increased time. Decreased attention to L. Motivated        Exercises Exercises: Other exercises;General Upper Extremity General Exercises - Upper Extremity Elbow Flexion: AROM;Left;10 reps;Seated (support at elbow) Elbow Extension: AROM;Left;10 reps;Seated (support at elbow) Other Exercises Other Exercises: Simulated cleaning activity. Pt  placing LUE on table and performing forward and back movement (forward reach) 10x. Then sliding wash cloth to each corner of table; 5x. Support L elbow   Shoulder Instructions       General Comments      Pertinent Vitals/ Pain       Pain Assessment: No/denies pain  Home Living                                          Prior Functioning/Environment              Frequency  Min 3X/week        Progress Toward Goals  OT  Goals(current goals can now be found in the care plan section)  Progress towards OT goals: Progressing toward goals  Acute Rehab OT Goals Patient Stated Goal: regain independence, return to work OT Goal Formulation: With patient Time For Goal Achievement: 10/27/20 Potential to Achieve Goals: Good ADL Goals Pt Will Perform Eating: with modified independence;sitting Pt Will Perform Upper Body Dressing: with modified independence;sitting Pt Will Perform Lower Body Dressing: with modified independence;sit to/from stand Pt Will Transfer to Toilet: with modified independence;ambulating Pt Will Perform Toileting - Clothing Manipulation and hygiene: with modified independence;sitting/lateral leans;sit to/from stand Pt/caregiver will Perform Home Exercise Program: Increased ROM;Increased strength;Left upper extremity;With written HEP provided;Independently Additional ADL Goal #1: Pt will compete bed mobility at mod I level to prepare for EOB/OOB ADLs.  Plan Discharge plan remains appropriate    Co-evaluation                 AM-PAC OT "6 Clicks" Daily Activity     Outcome Measure   Help from another person eating meals?: A Little Help from another person taking care of personal grooming?: A Lot Help from another person toileting, which includes using toliet, bedpan, or urinal?: A Little Help from another person bathing (including washing, rinsing, drying)?: A Lot Help from another person to put on and taking off regular upper body clothing?: A Little Help from another person to put on and taking off regular lower body clothing?: A Lot 6 Click Score: 15    End of Session Equipment Utilized During Treatment: Rolling walker;Gait belt  OT Visit Diagnosis: Unsteadiness on feet (R26.81);Other symptoms and signs involving cognitive function;Hemiplegia and hemiparesis;Other symptoms and signs involving the nervous system (R29.898) Hemiplegia - Right/Left: Left Hemiplegia -  dominant/non-dominant: Non-Dominant   Activity Tolerance Patient tolerated treatment well   Patient Left in chair;with call bell/phone within reach;with chair alarm set   Nurse Communication Mobility status        Time: 1715-1740 OT Time Calculation (min): 25 min  Charges: OT General Charges $OT Visit: 1 Visit OT Treatments $Therapeutic Activity: 23-37 mins  Shannon Garza MSOT, OTR/L Acute Rehab Pager: [phone] Office: [phone]   Theodoro Grist Shannon Garza 10/15/2020, 5:55 PM

## 2020-10-15 NOTE — Progress Notes (Signed)
Physical Therapy Treatment Patient Details Name: Shannon Garza MRN: [id number] DOB: 01-03-1966 Today's Date: 10/15/2020    History of Present Illness Pt is a 55 y.o. female who presents with acute L-sided limb and facial weakness. MRI revealed acute/subacute infarct of R posterior internal capsule and remote infarct of L basal ganglia and L pons. Outside window for tPA. PMH: CVA 4 years ago, HTN, and DM2.    PT Comments    Pt continues to demonstrate L side weakness and intermittent neglect, needing cues to attend to L to ensure safety. Pt able to make significant progress with mobility ambulating up to ~150 ft with a RW and min-modAx2 for safety. She still remains at high risk for falls as her L knee does display slight instability (no appreciative buckling or LOB today), she has poor L foot clearance, and she displays poor coordination/balance and tends to let the RW get distal to her. Will continue to follow acutely. Current recommendations remain appropriate.    Follow Up Recommendations  CIR;Supervision for mobility/OOB     Equipment Recommendations  Rolling walker with 5" wheels;3in1 (PT)    Recommendations for Other Services       Precautions / Restrictions Precautions Precautions: Fall Restrictions Weight Bearing Restrictions: No    Mobility  Bed Mobility Overal bed mobility: Needs Assistance Bed Mobility: Supine to Sit     Supine to sit: HOB elevated;Min assist     General bed mobility comments: Extra time and minA for L UE management with supine > sit R EOB using R rail to pull up. Cues provided to manage L leg off bed.    Transfers Overall transfer level: Needs assistance Equipment used: Rolling walker (2 wheeled) Transfers: Sit to/from Stand Sit to Stand: Min assist         General transfer comment: Min A to power up into standing. Cues for hand placement and facilitating position of LUE for weight bearing and safety  Ambulation/Gait Ambulation/Gait  assistance: Min assist;Mod assist;+2 safety/equipment Gait Distance (Feet): 150 Feet (x3 bouts of ~3 ft > ~10 ft > ~150 ft) Assistive device: Rolling walker (2 wheeled) Gait Pattern/deviations: Step-through pattern;Decreased step length - right;Decreased step length - left;Decreased stride length;Decreased dorsiflexion - left;Trunk flexed Gait velocity: reduced Gait velocity interpretation: <1.31 ft/sec, indicative of household ambulator General Gait Details: Pt ambulating with slight L knee instability but no appreciative buckling, providing tactile cues intermittently at L quad during stance. Cued pt to only move RW or 1 foot at a time for safety when turning and to keep RW in contact with ground. Decreased L foot clearane thus provided tactile, visual, and verbal cues for improved foot clearance and heel strike, mod success. No overt LOB, min-modAx2 for longer gait bout.   Stairs             Wheelchair Mobility    Modified Rankin (Stroke Patients Only) Modified Rankin (Stroke Patients Only) Pre-Morbid Rankin Score: No symptoms Modified Rankin: Moderately severe disability     Balance Overall balance assessment: Needs assistance Sitting-balance support: No upper extremity supported;Feet supported Sitting balance-Leahy Scale: Fair Sitting balance - Comments: Static sitting EOB, supervision for safety.   Standing balance support: Bilateral upper extremity supported;During functional activity Standing balance-Leahy Scale: Poor Standing balance comment: Reliant on UE support and min A majority of mobility for stability.                            Cognition Arousal/Alertness: Awake/alert  Behavior During Therapy: [facility] for tasks assessed/performed;Impulsive Overall Cognitive Status: Impaired/Different from baseline Area of Impairment: Problem solving;Awareness                           Awareness: Emergent Problem Solving: Requires verbal cues General  Comments: Pt requiring increased time throughout. Follwoing simple cues with increased time. Decreased attention to L. Motivated      Exercises General Exercises - Upper Extremity Elbow Flexion: AROM;Left;10 reps;Seated (support at elbow) Elbow Extension: AROM;Left;10 reps;Seated (support at elbow) General Exercises - Lower Extremity Ankle Circles/Pumps: Strengthening;Both;10 reps;Seated Long Arc Quad: Strengthening;Both;Seated;5 reps (concentrating on eccentric control) Hip Flexion/Marching: Strengthening;Both;Seated;Other reps (comment) (3 reps) Other Exercises Other Exercises: Simulated cleaning activity. Pt placing LUE on table and performing forward and back movement (forward reach) 10x. Then sliding wash cloth to each corner of table; 5x. Support L elbow    General Comments General comments (skin integrity, edema, etc.): Educated pt on HEP of LAQ, marching, and ankle pumps and to get nursing to assist her to/from chair during meals and to/from bathroom for further functional progress      Pertinent Vitals/Pain Pain Assessment: No/denies pain    Home Living                      Prior Function            PT Goals (current goals can now be found in the care plan section) Acute Rehab PT Goals Patient Stated Goal: regain independence, return to work PT Goal Formulation: With patient Time For Goal Achievement: 10/27/20 Potential to Achieve Goals: Good Progress towards PT goals: Progressing toward goals    Frequency    Min 4X/week      PT Plan Current plan remains appropriate    Co-evaluation PT/OT/SLP Co-Evaluation/Treatment: Yes Reason for Co-Treatment: For patient/therapist safety;To address functional/ADL transfers PT goals addressed during session: Mobility/safety with mobility;Proper use of DME        AM-PAC PT "6 Clicks" Mobility   Outcome Measure  Help needed turning from your back to your side while in a flat bed without using bedrails?: A  Little Help needed moving from lying on your back to sitting on the side of a flat bed without using bedrails?: A Little Help needed moving to and from a bed to a chair (including a wheelchair)?: A Little Help needed standing up from a chair using your arms (e.g., wheelchair or bedside chair)?: A Little Help needed to walk in hospital room?: A Lot Help needed climbing 3-5 steps with a railing? : A Lot 6 Click Score: 16    End of Session Equipment Utilized During Treatment: Gait belt Activity Tolerance: Patient tolerated treatment well Patient left: in chair;with call bell/phone within reach (OT still in room working with pt)   PT Visit Diagnosis: Unsteadiness on feet (R26.81);Other abnormalities of gait and mobility (R26.89);Muscle weakness (generalized) (M62.81);Difficulty in walking, not elsewhere classified (R26.2);Other symptoms and signs involving the nervous system (R29.898);Hemiplegia and hemiparesis Hemiplegia - Right/Left: Left Hemiplegia - dominant/non-dominant: Non-dominant Hemiplegia - caused by: Cerebral infarction     Time: 1740-8144 PT Time Calculation (min) (ACUTE ONLY): 39 min  Charges:  $Gait Training: 8-22 mins                     Raymond Gurney, PT, DPT Acute Rehabilitation Services  Pager: [phone] Office: [phone]    Jewel Baize 10/15/2020, 6:06 PM

## 2020-10-16 LAB — BASIC METABOLIC PANEL
Anion gap: 12 (ref 5–15)
BUN: 8 mg/dL (ref 6–20)
CO2: 24 mmol/L (ref 22–32)
Calcium: 10.2 mg/dL (ref 8.9–10.3)
Chloride: 103 mmol/L (ref 98–111)
Creatinine, Ser: 0.86 mg/dL (ref 0.44–1.00)
GFR, Estimated: >60 mL/min (ref >60)
Glucose, Bld: 108 mg/dL — ABNORMAL HIGH (ref 70–99)
Potassium: 3.9 mmol/L (ref 3.5–5.1)
Sodium: 139 mmol/L (ref 135–145)

## 2020-10-16 MED ORDER — LISINOPRIL 20 MG PO TABS
20.0000 mg | ORAL_TABLET | Freq: Every day | ORAL | Status: DC
  Administered 2020-10-17 – 2020-10-18 (×2): 20 mg via ORAL
  Filled 2020-10-16 (×2): qty 1

## 2020-10-16 MED ORDER — SENNA 8.6 MG PO TABS
2.0000 | ORAL_TABLET | Freq: Every day | ORAL | Status: DC | PRN
  Administered 2020-10-16: 17.2 mg via ORAL
  Filled 2020-10-16 (×2): qty 2

## 2020-10-16 NOTE — Progress Notes (Signed)
Family Medicine Teaching Service Daily Progress Note Intern Pager: [phone]  Patient name: Shannon Garza Medical record number: [id number] Date of birth: Oct 20, 1965 Age: 55 y.o. Gender: female  Primary Care Provider: Patient, No Pcp Per Consultants: Neuro Code Status: Full  Pt Overview and Major Events to Date: 2/15- Admitted, found to have R internal capsule stroke   Assessment and Plan: Shannon Garza a 55 y.o. femalepresenting with acute CVA of right internal capsule with left sided weakness, continues to have left sided deficits. PMH is significant forhistory of CVA(2018), DM, HTN.  Patient is medically stable for discharge to CIR.  Acute CVA: Stable  Weakness LUE>LLE, MRI with acute/subacute infarct involving posterior limb of internal capsule on the right extending into the corona radiata.  Still has no grip strength on left, able to lift arm, 5/5 strength LLE.   - Neuro has signed off: DAPT for 3 weeks followed by ASA alone:aspirin 81 mg and Plavix 75 mg daily x 3 wks, then ASA 81mg  QD - conservative management with regards to carotid imaging per neuro recs - fall precautions  - PT, OT, speech consults: recommended CIR (consult placed), awaiting insurance authorization  - medically stable for d/c to CIR  Constipation: Improving  Started on mag citrate and senna on 2/19.  Had BM yesterday. - cont miralax qd  - cont senna qd prn - enema prn severe constipation  Chest pain: resolved EKG stable with negative troponins.  Denies complaints today. -Continue to monitor  Hypertension BPs overnight 153-178/70-77.  Does not appear she received lisniopril yesterday, MAR states that she was NPO. - Labetalol as needed ordered - Cont lisinopril 20 mg, will get first increased dose today  Diabetes Currently diet controlled at home.Used to take Metformin but PCP took her off of this.Glucose has been well controlled. -A1c: 5.6 -Blood glucose on morning  labs  Hyperlipidemia  Total cholesterol 209, LDL 144 - Continue atorvastatin--this was given late in hospital stay due to strong patient preference to stay off of statin therapy (able to discuss over multiple days)    FEN/GI: Thin liquids  Prophylaxis:Lovenox   Disposition: CIR pending insurance approval, medically stable for d/c  Subjective:  Feeling well today.  States that she was told yesterday that her insurance approved CIR, but she is just waiting for a bed.  No complaints.  States that she is still not able to move her hand.  Objective: Temp:  [97.4 F (36.3 C)-98.7 F (37.1 C)] 98 F (36.7 C) (02/20 0018) Pulse Rate:  [57-63] 58 (02/20 0018) Resp:  [16-18] 18 (02/20 0018) BP: (153-178)/(66-77) 178/74 (02/20 0018) SpO2:  [97 %-100 %] 97 % (02/20 0018)  Physical Exam:  General: 55 y.o. female in NAD HEENT: NCAT, EOMI Cardio: RRR no m/r/g Lungs: CTAB, no wheezing, no rhonchi, no crackles, no IWOB on RA Abdomen: Soft, non-tender to palpation, non-distended, positive bowel sounds Skin: warm and dry Extremities: No edema Neuro: CN II-XII grossly intact, sensation intact throughout, 0/5 grip strength on left, 4/5 strength LUE, 5/5 strength LLE  Laboratory: Recent Labs  Lab 10/12/20 1009 10/12/20 1841  WBC 6.4 6.0  HGB 13.4 12.4  HCT 41.5 39.0  PLT 168 155   Recent Labs  Lab 10/12/20 1009 10/12/20 1841 10/13/20 0348 10/14/20 0347 10/16/20 0437  NA 138  --  139 138 139  K 3.2*  --  3.9 3.2* 3.9  CL 101  --  105 104 103  CO2 26  --  24 23 24   BUN  8  --  6 5* 8  CREATININE 0.73   < > 0.75 0.75 0.86  CALCIUM 10.1  --  9.8 10.1 10.2  PROT 7.4  --   --   --   --   BILITOT 0.6  --   --   --   --   ALKPHOS 68  --   --   --   --   ALT 13  --   --   --   --   AST 25  --   --   --   --   GLUCOSE 126*  --  104* 111* 108*   < > = values in this interval not displayed.     Imaging/Diagnostic Tests: No results found.  Wesson Stith, Solmon Ice, DO 10/17/2020,  5:22 AM PGY-3, [facility] FPTS Intern pager: [phone], text pages welcome

## 2020-10-16 NOTE — Progress Notes (Signed)
Pt wanted her IV removed around 1:30pm due to discomfort.

## 2020-10-16 NOTE — Plan of Care (Signed)

## 2020-10-18 ENCOUNTER — Other Ambulatory Visit: Payer: Self-pay

## 2020-10-18 ENCOUNTER — Inpatient Hospital Stay (HOSPITAL_COMMUNITY)
Admission: RE | Admit: 2020-10-18 | Discharge: 2020-11-02 | DRG: 057 | Disposition: A | Discharge status: Home / Self Care | Payer: BC Managed Care – PPO | Source: Intra-hospital | Attending: Physical Medicine and Rehabilitation | Admitting: Physical Medicine and Rehabilitation

## 2020-10-18 ENCOUNTER — Encounter (HOSPITAL_COMMUNITY): Payer: Self-pay | Admitting: Physical Medicine and Rehabilitation

## 2020-10-18 DIAGNOSIS — K5900 Constipation, unspecified: Secondary | ICD-10-CM | POA: Diagnosis present

## 2020-10-18 DIAGNOSIS — E876 Hypokalemia: Secondary | ICD-10-CM | POA: Diagnosis present

## 2020-10-18 DIAGNOSIS — I69354 Hemiplegia and hemiparesis following cerebral infarction affecting left non-dominant side: Principal | ICD-10-CM

## 2020-10-18 DIAGNOSIS — I10 Essential (primary) hypertension: Secondary | ICD-10-CM | POA: Diagnosis not present

## 2020-10-18 DIAGNOSIS — R001 Bradycardia, unspecified: Secondary | ICD-10-CM | POA: Diagnosis not present

## 2020-10-18 DIAGNOSIS — Z6841 Body Mass Index (BMI) 40.0 and over, adult: Secondary | ICD-10-CM | POA: Diagnosis not present

## 2020-10-18 DIAGNOSIS — E119 Type 2 diabetes mellitus without complications: Secondary | ICD-10-CM | POA: Diagnosis present

## 2020-10-18 DIAGNOSIS — Z79899 Other long term (current) drug therapy: Secondary | ICD-10-CM | POA: Diagnosis not present

## 2020-10-18 DIAGNOSIS — I639 Cerebral infarction, unspecified: Secondary | ICD-10-CM | POA: Diagnosis not present

## 2020-10-18 DIAGNOSIS — E8809 Other disorders of plasma-protein metabolism, not elsewhere classified: Secondary | ICD-10-CM | POA: Diagnosis not present

## 2020-10-18 DIAGNOSIS — E785 Hyperlipidemia, unspecified: Secondary | ICD-10-CM | POA: Diagnosis not present

## 2020-10-18 DIAGNOSIS — G8194 Hemiplegia, unspecified affecting left nondominant side: Secondary | ICD-10-CM

## 2020-10-18 LAB — CBC
HCT: 43.5 % (ref 36.0–46.0)
Hemoglobin: 13.6 g/dL (ref 12.0–15.0)
MCH: 26.3 pg (ref 26.0–34.0)
MCHC: 31.3 g/dL (ref 30.0–36.0)
MCV: 84.1 fL (ref 80.0–100.0)
Platelets: 153 10*3/uL (ref 150–400)
RBC: 5.17 MIL/uL — ABNORMAL HIGH (ref 3.87–5.11)
RDW: 13.1 % (ref 11.5–15.5)
WBC: 4.8 10*3/uL (ref 4.0–10.5)
nRBC: 0 % (ref 0.0–0.2)

## 2020-10-18 LAB — BASIC METABOLIC PANEL
Anion gap: 8 (ref 5–15)
BUN: 11 mg/dL (ref 6–20)
CO2: 25 mmol/L (ref 22–32)
Calcium: 10 mg/dL (ref 8.9–10.3)
Chloride: 107 mmol/L (ref 98–111)
Creatinine, Ser: 0.8 mg/dL (ref 0.44–1.00)
GFR, Estimated: >60 mL/min (ref >60)
Glucose, Bld: 101 mg/dL — ABNORMAL HIGH (ref 70–99)
Potassium: 3.7 mmol/L (ref 3.5–5.1)
Sodium: 140 mmol/L (ref 135–145)

## 2020-10-18 MED ORDER — ACETAMINOPHEN 650 MG RE SUPP: 650.0000 mg | RECTAL | Status: DC | PRN

## 2020-10-18 MED ORDER — SENNA 8.6 MG PO TABS
2.0000 | ORAL_TABLET | Freq: Every day | ORAL | Status: DC | PRN
  Administered 2020-10-19: 17.2 mg via ORAL
  Filled 2020-10-18: qty 2

## 2020-10-18 MED ORDER — ENOXAPARIN SODIUM 40 MG/0.4ML ~~LOC~~ SOLN
40.0000 mg | SUBCUTANEOUS | Status: DC
  Administered 2020-10-18: 40 mg via SUBCUTANEOUS
  Filled 2020-10-18: qty 0.4

## 2020-10-18 MED ORDER — LISINOPRIL 20 MG PO TABS
20.0000 mg | ORAL_TABLET | Freq: Every day | ORAL | Status: DC
  Administered 2020-10-19 – 2020-10-21 (×3): 20 mg via ORAL
  Filled 2020-10-18 (×3): qty 1

## 2020-10-18 MED ORDER — ACETAMINOPHEN 160 MG/5ML PO SOLN
650.0000 mg | ORAL | Status: DC | PRN
Start: 2020-10-18 — End: 2020-11-02

## 2020-10-18 MED ORDER — ATORVASTATIN CALCIUM 40 MG PO TABS: 40.0000 mg | ORAL_TABLET | Freq: Every day | ORAL | 0 refills | Status: DC

## 2020-10-18 MED ORDER — ATORVASTATIN CALCIUM 40 MG PO TABS
40.0000 mg | ORAL_TABLET | Freq: Every day | ORAL | Status: DC
  Administered 2020-10-18 – 2020-11-01 (×15): 40 mg via ORAL
  Filled 2020-10-18 (×15): qty 1

## 2020-10-18 MED ORDER — CLOPIDOGREL BISULFATE 75 MG PO TABS: 75.0000 mg | ORAL_TABLET | Freq: Every day | ORAL | Status: DC

## 2020-10-18 MED ORDER — LISINOPRIL 20 MG PO TABS: 20.0000 mg | ORAL_TABLET | Freq: Every day | ORAL | Status: DC

## 2020-10-18 MED ORDER — ASPIRIN 81 MG PO CHEW: 81.0000 mg | CHEWABLE_TABLET | Freq: Every day | ORAL | Status: DC

## 2020-10-18 MED ORDER — BLOOD PRESSURE CONTROL BOOK
Freq: Once | Status: AC
  Filled 2020-10-18: qty 1

## 2020-10-18 MED ORDER — ASPIRIN 81 MG PO CHEW
81.0000 mg | CHEWABLE_TABLET | Freq: Every day | ORAL | Status: DC
  Administered 2020-10-19 – 2020-11-02 (×15): 81 mg via ORAL
  Filled 2020-10-18 (×15): qty 1

## 2020-10-18 MED ORDER — ACETAMINOPHEN 325 MG PO TABS
650.0000 mg | ORAL_TABLET | ORAL | Status: DC | PRN
Start: 2020-10-18 — End: 2020-11-02
  Administered 2020-10-22 – 2020-10-26 (×2): 650 mg via ORAL
  Filled 2020-10-18 (×2): qty 2

## 2020-10-18 MED ORDER — ENOXAPARIN SODIUM 40 MG/0.4ML ~~LOC~~ SOLN: 40.0000 mg | SUBCUTANEOUS | Status: DC

## 2020-10-18 MED ORDER — ACETAMINOPHEN 325 MG PO TABS
650.0000 mg | ORAL_TABLET | ORAL | Status: AC | PRN
Start: 2020-10-18 — End: ?

## 2020-10-18 MED ORDER — CLOPIDOGREL BISULFATE 75 MG PO TABS
75.0000 mg | ORAL_TABLET | Freq: Every day | ORAL | Status: DC
  Administered 2020-10-19 – 2020-11-02 (×15): 75 mg via ORAL
  Filled 2020-10-18 (×15): qty 1

## 2020-10-18 MED ORDER — EXERCISE FOR HEART AND HEALTH BOOK
Freq: Once | Status: AC
  Filled 2020-10-18: qty 1

## 2020-10-18 MED ORDER — POLYETHYLENE GLYCOL 3350 17 G PO PACK
17.0000 g | PACK | Freq: Every day | ORAL | Status: DC
  Filled 2020-10-18 (×9): qty 1

## 2020-10-18 NOTE — Progress Notes (Signed)
Standley BrookingBoyette, Gearld Kerstein G, RN  Rehab Admission Coordinator  Physical Medicine and Rehabilitation  PMR Pre-admission      Signed  Date of Service:  10/14/2020  4:04 PM      Related encounter: ED to Hosp-Admission (Current) from 10/12/2020 in [facility]       Signed          Show:Clear all [x] Manual[x] Template[x] Copied  Added by: [x] Standley BrookingBoyette, Adysson Revelle G, RN   [] Hover for details  PMR Admission Coordinator Pre-Admission Assessment   Patient: Shannon CoderGwendolyn Garza is an 55 y.o., female MRN: [id number] DOB: 05/21/1966 Height: 5\' 3"  (160 cm) Weight: 130.6 kg                                                                                                                                             Insurance Information HMO:     PPO: yes     PCP:      IPA:      80/20:      OTHER:  PRIMARY: [employer] [place]      Policy#: [id number]      Subscriber: pt CM Name: via fax      Phone#: [phone]   Fax#: [phone] Pre-Cert#: [id number]   Approved until 3/2 when updates are due   Employer: [employer] Benefits:  Phone #: [phone]     Name: 2/17 Eff. Date: 08/04/2020     Deduct: $3300      Out of Pocket Max: $6400  CIR: 75% after deductible      SNF: 75% 100 days per year Outpatient: $30 to $45 per visit     Co-Pay: visits per medical neccesity Home Health: 75%      Co-Pay: visits per medcail necceisty DME: 75%     Co-Pay: 25% Providers: in network  SECONDARY: none   Financial Counselor:       Phone#:    The Data processing manager"Data Collection Information Summary" for patients in Inpatient Rehabilitation Facilities with attached "Privacy Act Statement-Health Care Records" was provided and verbally reviewed with: N/A   Emergency Contact Information         Contact Information     Name Relation Home Work Mobile    [place],Garza Shannon [phone]           Current Medical History  Patient Admitting Diagnosis: CVA   History of Present Illness:  55 year old right-handed female  with history of prediabetes, CVA 2018 and hypertension.  Patient opted for herbal medicine route rather than taking other medications after CVA.   Shannon can provide assistance.  Presented 10/12/2020 with left-sided weakness 10/11/2020.  Noted blood pressure systolic 163-194.  CT/MRI showed area of restricted diffusion involving the posterior limb of internal capsule on the right extending into the corona radiata consistent with acute/subacute infarction.  Remote infarct in left basal ganglia and left side of the pons.  CT angiogram  of the head and neck no hemodynamically significant stenosis in the neck.  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  Echocardiogram with ejection fraction of 60 to 65% no wall motion abnormalities.  Admission chemistries urine drug screen negative potassium 3.2 glucose 126 alcohol negative hemoglobin A1c 5.6.  Currently maintained on aspirin and Plavix for CVA prophylaxis x3 weeks and aspirin alone.  Subcutaneous Lovenox for DVT prophylaxis.  Tolerating a regular diet.     Complete NIHSS TOTAL: 3 Glasgow Coma Scale Score: 15 Past Medical History      Past Medical History:  Diagnosis Date  . Diabetes mellitus without complication (HCC)    . Hypertension      Family History  family history is not on file.   Prior Rehab/Hospitalizations:  Has the patient had prior rehab or hospitalizations prior to admission? Yes   Has the patient had major surgery during 100 days prior to admission? No   Current Medications    Current Facility-Administered Medications:  .  acetaminophen (TYLENOL) tablet 650 mg, 650 mg, Oral, Q4H PRN **OR** acetaminophen (TYLENOL) 160 MG/5ML solution 650 mg, 650 mg, Per Tube, Q4H PRN **OR** acetaminophen (TYLENOL) suppository 650 mg, 650 mg, Rectal, Q4H PRN, Mirian Mo, MD .  aspirin chewable tablet 81 mg, 81 mg, Oral, Daily, Mirian Mo, MD, 81 mg at 10/18/20 1007 .  atorvastatin (LIPITOR) tablet 40 mg, 40 mg, Oral, QHS, Mirian Mo, MD, 40 mg at 10/17/20 2150 .  clopidogrel (PLAVIX) tablet 75 mg, 75 mg, Oral, Daily, Rejeana Brock, MD, 75 mg at 10/18/20 1007 .  enoxaparin (LOVENOX) injection 40 mg, 40 mg, Subcutaneous, Q24H, Mirian Mo, MD, 40 mg at 10/17/20 1812 .  labetalol (NORMODYNE) injection 5 mg, 5 mg, Intravenous, Q2H PRN, Mirian Mo, MD .  lisinopril (ZESTRIL) tablet 20 mg, 20 mg, Oral, Daily, Mirian Mo, MD, 20 mg at 10/18/20 1007 .  milk and molasses enema, 1 enema, Rectal, Once PRN, Fayette Pho, MD .  polyethylene glycol (MIRALAX / GLYCOLAX) packet 17 g, 17 g, Oral, Daily, Paige, Victoria J, DO, 17 g at 10/15/20 0905 .  senna (SENOKOT) tablet 17.2 mg, 2 tablet, Oral, Daily PRN, Westley Chandler, MD, 17.2 mg at 10/16/20 1118   Patients Current Diet:     Diet Order                      Diet - low sodium heart healthy              Diet vegetarian Room service appropriate? 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 Pt drives.   Self Care: Did the patient need help bathing, dressing, using the toilet or eating?  Independent   Indoor Mobility: Did the patient need assistance with walking from room to room (with or without device)? Independent   Stairs: Did the patient need assistance with internal or external stairs (with or without device)? Independent   Functional Cognition: Did the patient need help planning regular tasks  such as shopping or remembering to take medications? Independent   Home Assistive Devices / Equipment Home Assistive Devices/Equipment: None Home  Equipment: Hand held shower head,Shower seat - built in   Prior Device Use: Indicate devices/aids used by the patient prior to current illness, exacerbation or injury? None of the above   Current Functional Level Cognition   Arousal/Alertness: Awake/alert Overall Cognitive Status: Within Functional Limits for tasks assessed Orientation Level: Oriented X4 General Comments: pt following all commands and answers questions appropriately. Did need reminder cues to bring left arm along with her when moving from supine to sitting edge of bed, was advancing left LE without cues. Attention: Focused,Sustained Focused Attention: Appears intact Sustained Attention: Appears intact Memory: Impaired Memory Impairment: Retrieval deficit,Storage deficit,Decreased recall of new information (Immediate: 5/5; delayed: 4/5; with cue: 1/1) Awareness: Impaired Awareness Impairment: Emergent impairment Problem Solving: Impaired Problem Solving Impairment: Verbal complex Executive Function: Reasoning,Sequencing,Organizing Reasoning: Appears intact Sequencing: Impaired Sequencing Impairment: Verbal complex (clockdrawing: 0/4) Organizing Impairment: Verbal complex (backward digit span: 0/2)    Extremity Assessment (includes Sensation/Coordination)   Upper Extremity Assessment: LUE deficits/detail LUE Deficits / Details: 3-/5 shoulder and distal. benefits from multimodal cues. Impaired gross and fine motor coordination. Impaired proprioception (vs decreased control of movements d/t weakness?). Poor grasp and FM LUE Sensation: decreased proprioception,decreased light touch LUE Coordination: decreased fine motor,decreased gross motor  Lower Extremity Assessment: Defer to PT evaluation LLE Deficits / Details: Slight weakness in L hip flexor with MMT score of 4- compared to 4+ to 5 grossly throughout remaining L leg and grossly throughout R leg LLE Sensation: WNL LLE Coordination: decreased fine motor,decreased  gross motor (dysdiadochokinesia and dysmetria noted)     ADLs   Overall ADL's : Needs assistance/impaired Eating/Feeding: Minimal assistance,Sitting Eating/Feeding Details (indicate cue type and reason): assist for bilateral tasks. Impairment is in nondominant UE Grooming: Moderate assistance,Sitting Upper Body Bathing: Moderate assistance,Sitting Lower Body Bathing: Minimal assistance,Sit to/from stand Upper Body Dressing : Moderate assistance,Sitting Lower Body Dressing: Minimal assistance,Sit to/from stand Toilet Transfer: Minimal Designer, television/film set Details (indicate cue type and reason): Min A +2 for power up and then to position hands on RW Toileting- Clothing Manipulation and Hygiene: Minimal assistance,Sit to/from stand Tub/ Shower Transfer: Tub transfer,Minimal assistance Functional mobility during ADLs: Moderate assistance,+2 for safety/equipment,+2 for physical assistance,Rolling walker General ADL Comments: Pt performing functional mobility in hallway with Mod A +2 and RW. Cues for sequence and smooth movement patterns during mobility. Providing support at L hand and elbow to optimize WB at LUE     Mobility   Overal bed mobility: Needs Assistance Bed Mobility: Supine to Sit Rolling: Min guard (partial roll to come to EOB) Sidelying to sit: Mod assist Supine to sit: Min assist,HOB elevated General bed mobility comments: with HOB ~30 degrees cues on technique and use of right UE to elevate trunk into sitting. pt able to advance bil LE's to/off edge of bed. assist/cues to bring left shoulder/arm forward with transition to sitting at edge of bed.     Transfers   Overall transfer level: Needs assistance Equipment used: Rolling walker (2 wheeled) Transfers: Sit to/from Stand Sit to Stand: Min assist Stand pivot transfers: Min assist General transfer comment: min assist to power up into standing with cues/faciliation for anterior weight shifting and hand  placement (pt with left UE on RW and right UE pushing from bed).     Ambulation / Gait / Stairs / Wheelchair Mobility   Ambulation/Gait Ambulation/Gait assistance: Holiday representative  Distance (Feet): 6 Feet Assistive device: Rolling walker (2 wheeled) Gait Pattern/deviations: Step-through pattern,Decreased step length - right,Decreased step length - left,Decreased stride length,Decreased dorsiflexion - left,Trunk flexed General Gait Details: from bed to recliner. distance limited this session due to second person assist not available. pt with minimal left LE instability noted with the short distance. Does continue to have poor foot clearance. May benefit from hand orthotic for left UE until grip strength improved. Will defer this to OT/CIR. Gait velocity: reduced Gait velocity interpretation: <1.31 ft/sec, indicative of household ambulator     Posture / Balance Dynamic Sitting Balance Sitting balance - Comments: Static sitting EOB, supervision for safety. Balance Overall balance assessment: Needs assistance Sitting-balance support: No upper extremity supported,Feet supported Sitting balance-Leahy Scale: Fair Sitting balance - Comments: Static sitting EOB, supervision for safety. Standing balance support: Bilateral upper extremity supported,During functional activity Standing balance-Leahy Scale: Poor Standing balance comment: Reliant on UE support and min A majority of mobility for stability.     Special needs/care consideration Designated visitor is Shannon, Wynelle Cleveland    Previous Home Environment  Living Arrangements:  (lives with brother and Shannon. Recent move 11/21 from [place])  Lives With: Family Available Help at Discharge: Family,Available 24 hours/day Type of Home: House Home Layout: Multi-level,Bed/bath upstairs Alternate Level Stairs-Rails: Left Alternate Level Stairs-Number of Steps: full flight Home Access: Stairs to enter Entrance Stairs-Rails: Right,Left,Can reach  both Entrance Stairs-Number of Steps: 2 Bathroom Shower/Tub: Health visitor: Handicapped height Bathroom Accessibility: Yes How Accessible: Accessible via walker Home Care Services: No Additional Comments: typically uses tub shower in her room. Could potentially have access to walkin shower with built in seat in brother's bathroom.   Discharge Living Setting Plans for Discharge Living Setting: Lives with (comment) (Brother and Shannon) Type of Home at Discharge: House Discharge Home Layout: Multi-level,Bed/bath upstairs Alternate Level Stairs-Rails: Left Alternate Level Stairs-Number of Steps: full flight Discharge Home Access: Stairs to enter Entrance Stairs-Rails: Right,Left,Can reach both Entrance Stairs-Number of Steps: 2 Discharge Bathroom Shower/Tub: Walk-in shower,Tub/shower unit Discharge Bathroom Toilet: Handicapped height Discharge Bathroom Accessibility: Yes How Accessible: Accessible via walker Does the patient have any problems obtaining your medications?: No   Social/Family/Support Systems Contact Information: Garza, Shannon Anticipated Caregiver: Garza Anticipated Caregiver's Contact Information: see above Ability/Limitations of Caregiver: supervision level Caregiver Availability: 24/7 Discharge Plan Discussed with Primary Caregiver: Yes Is Caregiver In Agreement with Plan?: Yes Does Caregiver/Family have Issues with Lodging/Transportation while Pt is in Rehab?: No   Goals Patient/Family Goal for Rehab: Mod I to supervision with PT, OT, and SLP Expected length of stay: ELOS 12 to 17 days Additional Information: Recent move from [place] 06/2020 Pt/Family Agrees to Admission and willing to participate: Yes Program Orientation Provided & Reviewed with Pt/Caregiver Including Roles  & Responsibilities: Yes   Decrease burden of Care through IP rehab admission: n/a   Possible need for SNF placement upon discharge:not anticipated   Patient Condition:  This patient's medical and functional status has changed since the consult dated: 10/14/2020 in which the Rehabilitation Physician determined and documented that the patient's condition is appropriate for intensive rehabilitative care in an inpatient rehabilitation facility. See "History of Present Illness" (above) for medical update. Functional changes are: overall mod assist. Patient's medical and functional status update has been discussed with the Rehabilitation physician and patient remains appropriate for inpatient rehabilitation. Will admit to inpatient rehab today.   Preadmission Screen Completed By:  Clois Dupes, RN, 10/18/2020 11:14 AM ______________________________________________________________________   Discussed status  with Dr. Riley Kill on 10/18/2020 at  1115 and received approval for admission today.   Admission Coordinator:  Clois Dupes, time 5056 Date 10/18/2020             Cosigned by: Ranelle Oyster, MD at 10/18/2020 11:27 AM    Revision History                                  Note Details  Author Standley Brooking, RN File Time 10/18/2020 11:14 AM  Author Type Rehab Admission Coordinator Status Signed  Last Editor Standley Brooking, RN Service Physical Medicine and Rehabilitation

## 2020-10-18 NOTE — Discharge Instructions (Signed)
Dear Shannon Garza,  Thank you for letting us participate in your care. You were hospitalized for a stroke. We have treated you and are sending you to CIR, which is a rigorous inpatient rehabilitation program.   POST-HOSPITAL & CARE INSTRUCTIONS 1. You will take aspirin and plavix both every day for three weeks (last day is March 8). After that, you will take just aspirin.  2. Go to your follow up appointments (listed below)   DOCTOR'S APPOINTMENT   No future appointments.   Take care and be well!  Family Medicine Teaching Service Inpatient Team [place]  [facility]  [address] [place], [place] [address] [phone]

## 2020-10-18 NOTE — Progress Notes (Signed)
Ranelle Oyster, MD  Physician  Physical Medicine and Rehabilitation  Consult Note      Signed  Date of Service:  10/14/2020  7:43 AM      Related encounter: ED to Hosp-Admission (Current) from 10/12/2020 in [facility]       Signed      Expand All Collapse All     Show:Clear all [x] Manual[x] Template[] Copied  Added by: [x] Angiulli, , PA-C[x] , MD   [] Hover for details           Physical Medicine and Rehabilitation Consult Reason for Consult: Left side weakness Referring Physician: Family medicine     HPI: Shannon Garza is a 55 y.o. right-handed female with history of diabetes mellitus CVA 2018, and hypertension.  Per chart review patient lives with brother sister and sister-in-law.  Independent prior to admission works from home.  Multilevel home bed and bath upstairs two steps to entry.  Sister can provide assistance.  Presented 10/12/2020 with  left-sided weakness 10/11/2020 of acute onset.  Noted blood pressure systolic 163-194.  CT/MRI showed area of restricted diffusion involving the posterior limb of internal capsule on the right extending into the corona radiata consistent with acute/subacute infarction.  Remote infarct in left basal ganglia and left side of the pons.  CT angiogram of head and neck no hemodynamically significant stenosis in the neck.  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  Echocardiogram with ejection fraction of 60 to 65% no wall motion abnormalities.  Admission chemistries urine drug screen negative, potassium 3.2, glucose 126, alcohol negative, hemoglobin A1c 5.6.  Currently maintained on aspirin and Plavix for CVA prophylaxis x3 weeks then aspirin alone.  Subcutaneous Lovenox for DVT prophylaxis.  Tolerating a regular diet.  Therapy evaluations completed with recommendations of physical medicine rehab consult due to left-sided weakness     Review of Systems  Constitutional: Negative  for chills and fever.  HENT: Negative for hearing loss.   Eyes: Negative for blurred vision and double vision.  Respiratory: Negative for cough and shortness of breath.   Cardiovascular: Negative for chest pain, palpitations and leg swelling.  Gastrointestinal: Positive for constipation. Negative for heartburn, nausea and vomiting.  Genitourinary: Negative for dysuria.  Musculoskeletal: Positive for myalgias.  Skin: Negative for rash.  Neurological: Positive for weakness.       Occasional headaches  All other systems reviewed and are negative.       Past Medical History:  Diagnosis Date  . Diabetes mellitus without complication (HCC)    . Hypertension      History reviewed. No pertinent surgical history. History reviewed. No pertinent family history. Social History:  reports that she has never smoked. She has never used smokeless tobacco. She reports previous alcohol use. She reports previous drug use. Allergies: No Known Allergies       Medications Prior to Admission  Medication Sig Dispense Refill  . DANDELION PO Take 1 tablet by mouth daily.      . Ferrous Sulfate (IRON PO) Take 1 tablet by mouth daily.      2019 HAWTHORN PO Take 1 tablet by mouth daily.      10/14/2020 MAGNESIUM PO Take 1 tablet by mouth daily.      10/13/2020 POTASSIUM PO Take 1 tablet by mouth daily. Over the counter      . Turmeric (QC TUMERIC COMPLEX PO) Take 1 tablet by mouth daily.      . vitamin C (ASCORBIC  ACID) 500 MG tablet Take 1,000 mg by mouth daily.          Home: Home Living Family/patient expects to be discharged to:: Private residence Living Arrangements: Other relatives (brother, sister, and sister-in-law) Available Help at Discharge: Family,Available 24 hours/day Type of Home: House Home Access: Stairs to enter [employer] of Steps: 2 Entrance Stairs-Rails: Right,Left,Can reach both Home Layout: Multi-level,Bed/bath upstairs Alternate Level Stairs-Number of Steps: full flight Alternate Level  Stairs-Rails: Left Bathroom Shower/Tub: Health visitor: Handicapped height Home Equipment: Hand held shower head,Shower seat - built in Additional Comments: typically uses tub shower in her room. Could potentially have access to walkin shower with built in seat in brother's bathroom.  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 Pt drives. Functional Status:  Mobility: Bed Mobility Overal bed mobility: Needs Assistance Bed Mobility: Rolling,Sidelying to Sit Rolling: Min guard (partial roll to come to EOB) Sidelying to sit: Mod assist General bed mobility comments: Pt rolled to L with min guard and use of bed rail. Cued pt to bring L elbow under trunk to push up to ascend trunk, modA to complete as poor initiation noted by L arm. Bed flat during all bed mob. Transfers Overall transfer level: Needs assistance Equipment used: Rolling walker (2 wheeled) Transfers: Sit to/from Stand Sit to Stand: Min assist Stand pivot transfers: Min assist General transfer comment: Cued pt to push up from bed and transition hands to RW, minA for steadying and extra time to power up to stand. No overt LOB. Ambulation/Gait Ambulation/Gait assistance: Min assist,Max assist Gait Distance (Feet): 15 Feet Assistive device: Rolling walker (2 wheeled) Gait Pattern/deviations: Step-through pattern,Decreased step length - right,Decreased step length - left,Decreased stride length,Staggering left General Gait Details: Initially, pt ambulating slowly but steady and no knee buckling or LOB with min guard-A towards door of room using RW. PT guarding pt posterior to pt with hands on gait belt throughout. When pt was cued to turn around towards the R to return back towards chair pt became quiet and then suddenly staggered to the L. PT unable to fully recover pt's balance with maxA and thus PT controlled descent with maxA and with use of gait  belt as pt slid her back down the bathroom door until she was sitting on the ground. RNs and NT called to assist pt off ground back to chair, again only needing minA to ambulate another ~15 ft back to chair without LOB this time. RN assess pt with BP being high but RN reported it had been like that all day and was still within set normal parameters at this time. Repeatedly asked pt if she hit her head or sustained any injuries, in which she denied all. Initially she did report her L pinky finger hurt a little though. Gait velocity: reduced Gait velocity interpretation: <1.31 ft/sec, indicative of household ambulator   ADL: ADL Overall ADL's : Needs assistance/impaired Eating/Feeding: Minimal assistance,Sitting Eating/Feeding Details (indicate cue type and reason): assist for bilateral tasks. Impairment is in nondominant UE Grooming: Moderate assistance,Sitting Upper Body Bathing: Moderate assistance,Sitting Lower Body Bathing: Minimal assistance,Sit to/from stand Upper Body Dressing : Moderate assistance,Sitting Lower Body Dressing: Minimal assistance,Sit to/from stand Toilet Transfer: Minimal assistance Toilet Transfer Details (indicate cue type and reason): steadying assist to take pivotal steps to recliner Toileting- Clothing Manipulation and Hygiene: Minimal assistance,Sit to/from stand Tub/ Shower Transfer: Tub transfer,Minimal assistance Functional mobility during ADLs: Minimal assistance (steadying assist to take pivotal  steps to recliner) General ADL Comments: Presents with LUE deficits in strength (3-/5 at all levels) and coordination, possibly some mild L side neglect? impacting UB/LB ADLs. A bit impulsive. steadying assist to take pivtal steps EOB to recliner   Cognition: Cognition Overall Cognitive Status: No family/caregiver present to determine baseline cognitive functioning Arousal/Alertness: Awake/alert Orientation Level: Oriented X4 Attention: Focused,Sustained Focused  Attention: Appears intact Sustained Attention: Appears intact Memory: Impaired Memory Impairment: Retrieval deficit,Storage deficit,Decreased recall of new information (Immediate: 5/5; delayed: 4/5; with cue: 1/1) Awareness: Impaired Awareness Impairment: Emergent impairment Problem Solving: Impaired Problem Solving Impairment: Verbal complex Executive Function: Reasoning,Sequencing,Organizing Reasoning: Appears intact Sequencing: Impaired Sequencing Impairment: Verbal complex (clockdrawing: 0/4) Organizing Impairment: Verbal complex (backward digit span: 0/2) Cognition Arousal/Alertness: Awake/alert Behavior During Therapy: WFL for tasks assessed/performed,Impulsive Overall Cognitive Status: No family/caregiver present to determine baseline cognitive functioning General Comments: Pt able to overall answer questions appropriately, A&Ox4. Question her insight into current deficits as pt had sudden LOB towards the L when trying to turn around to return to chair. Pt reported she was thought her L knee "gave" out under her, causing the fall. When cued to sit up once pt sitting on floor with controlled descent provided by PT she then impulsively leaned posteriorly 2x and needed cues and assistance to return to upright position and maintain it.   Blood pressure (!) 184/76, pulse 65, temperature 98.3 F (36.8 C), temperature source Oral, resp. rate 20, height 5\' 3"  (1.6 m), weight 130.6 kg, SpO2 93 %. Physical Exam Constitutional:      Comments: obese  HENT:     Head: Normocephalic.     Right Ear: External ear normal.     Left Ear: External ear normal.     Nose: Nose normal.  Cardiovascular:     Rate and Rhythm: Normal rate.  Pulmonary:     Effort: Pulmonary effort is normal.  Abdominal:     Palpations: Abdomen is soft.  Musculoskeletal:     Cervical back: Normal range of motion.  Neurological:     Mental Status: She is alert.     Comments: Patient is alert in no acute distress.   Makes eye contact with examiner.  Oriented x3 and follows commands.  Fair awareness of deficits. Speech sl dysarthric but very intelligible. Sl CN7. LUE 2/5 deltoid, 2+ to 3/5 biceps, triceps and 2/5 wrist and HI. LLE 3 to 3+/5 prox to distal. Senses pain and LT in all 4's.   Psychiatric:        Mood and Affect: Mood normal.        Behavior: Behavior normal.        Thought Content: Thought content normal.        Judgment: Judgment normal.        Lab Results Last 24 Hours       Results for orders placed or performed during the hospital encounter of 10/12/20 (from the past 24 hour(s))  Basic metabolic panel     Status: Abnormal    Collection Time: 10/14/20  3:47 AM  Result Value Ref Range    Sodium 138 135 - 145 mmol/L    Potassium 3.2 (L) 3.5 - 5.1 mmol/L    Chloride 104 98 - 111 mmol/L    CO2 23 22 - 32 mmol/L    Glucose, Bld 111 (H) 70 - 99 mg/dL    BUN 5 (L) 6 - 20 mg/dL    Creatinine, Ser 10/16/20 0.44 - 1.00 mg/dL    Calcium  10.1 8.9 - 10.3 mg/dL    GFR, Estimated >81 >19 mL/min    Anion gap 11 5 - 15       Imaging Results (Last 48 hours)  CT HEAD WO CONTRAST   Result Date: 10/12/2020 CLINICAL DATA:  Neuro deficit, acute, stroke suspected. Additional history provided: Left-sided weakness since yesterday, low back pain. EXAM: CT HEAD WITHOUT CONTRAST TECHNIQUE: Contiguous axial images were obtained from the base of the skull through the vertex without intravenous contrast. COMPARISON:  No pertinent prior exams available for comparison. FINDINGS: Brain: Cerebral volume is normal for age. Chronic small-vessel infarct within the left basal ganglia and internal capsule. Small chronic lacunar infarct within the right caudate nucleus. A 3 mm focus of hypodensity in the inferior right basal ganglia likely reflects a prominent perivascular space. Advanced ill-defined hypoattenuation within the cerebral white matter is nonspecific, but most often secondary to chronic small vessel ischemia. There  is no acute intracranial hemorrhage. No demarcated cortical infarct. No extra-axial fluid collection. No evidence of intracranial mass. No midline shift. Partially empty sella turcica. Vascular: No hyperdense vessel.  Atherosclerotic calcifications. Skull: Normal. Negative for fracture or focal lesion. Sinuses/Orbits: Visualized orbits show no acute finding. Trace ethmoid sinus mucosal thickening. IMPRESSION: No evidence of acute intracranial abnormality. Chronic small-vessel infarct within the left basal ganglia and internal capsule. Chronic right basal ganglia lacunar infarct. Background advanced cerebral white matter disease, nonspecific but most often secondary to chronic small vessel ischemia. Partially empty sella turcica. This finding is very commonly incidental, but can be associated with idiopathic intracranial hypertension. Electronically Signed   By: Jackey Loge DO   On: 10/12/2020 09:57    CT ANGIO NECK W OR WO CONTRAST   Result Date: 10/13/2020 CLINICAL DATA:  Acute infarct on MRI EXAM: CT ANGIOGRAPHY HEAD AND NECK TECHNIQUE: Multidetector CT imaging of the head and neck was performed using the standard protocol during bolus administration of intravenous contrast. Multiplanar CT image reconstructions and MIPs were obtained to evaluate the vascular anatomy. Carotid stenosis measurements (when applicable) are obtained utilizing NASCET criteria, using the distal internal carotid diameter as the denominator. CONTRAST:  76mL OMNIPAQUE IOHEXOL 350 MG/ML SOLN COMPARISON:  10/12/2020 FINDINGS: CT HEAD Brain: There is no acute intracranial hemorrhage or mass effect. There is subtle low-attenuation in the right corona radiata corresponding to infarction on MRI. Chronic infarct of the left basal ganglia and adjacent white matter. Additional patchy and confluent areas of hypoattenuation in the supratentorial white likely reflect age advanced chronic microvascular ischemic changes. Ventricles are stable size.  There is no extra-axial fluid collection. Vascular: No new findings. Skull: Calvarium is unremarkable. Sinuses/Orbits: No acute finding. Other: None. Review of the MIP images confirms the above findings CTA NECK Aortic arch: Great vessel origins are patent. Right carotid system: Patent. No measurable stenosis at the ICA origin. Left carotid system: Patent. No measurable stenosis at the ICA origin. Vertebral arteries: Patent and codominant. Skeleton: Mild cervical spine degenerative changes. Other neck: No significant abnormality. Upper chest: No apical lung mass. Review of the MIP images confirms the above findings CTA HEAD Anterior circulation: Intracranial internal carotid arteries patent with atherosclerotic irregularity primarily along the cavernous segments, right greater left. Irregular noncalcified plaque is present the floor of the posterior genu. There is a 3 x 2 mm inferiorly directed aneurysm the distal supraclinoid right ICA. Anterior cerebral arteries are patent. Hypoplastic right A1 ACA. Middle cerebral arteries are patent. There is mild atherosclerotic irregularity of distal branches. Posterior circulation: Intracranial  vertebral arteries, basilar artery, and posterior cerebral arteries are patent. Venous sinuses: Patent as allowed by contrast bolus timing. Review of the MIP images confirms the above findings IMPRESSION: No acute intracranial hemorrhage. Evolving acute infarction of the corona radiata better seen on MRI. No hemodynamically significant stenosis in the neck. Right greater than left intracranial internal carotid atherosclerosis. Irregular noncalcified plaque the floor of the posterior genu. Mild atherosclerotic irregularity of distal MCA branches bilaterally. 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 Electronically Signed   By: Guadlupe Spanish M.D.   On: 10/13/2020 14:05    MR Brain Wo Contrast (neuro protocol)   Addendum Date: 10/12/2020   ADDENDUM REPORT: 10/12/2020  15:32 ADDENDUM: These results were called by telephone on 10/12/2020 at 3:30 pm to provider [facility] , who verbally acknowledged these results. Electronically Signed   By: Baldemar Lenis M.D.   On: 10/12/2020 15:32    Result Date: 10/12/2020 CLINICAL DATA:  Neuro deficit, acute, stroke suspected. EXAM: MRI HEAD WITHOUT CONTRAST TECHNIQUE: Multiplanar, multiecho pulse sequences of the brain and surrounding structures were obtained without intravenous contrast. COMPARISON:  Head CT October 12, 2020 FINDINGS: Brain: Area of restricted diffusion involving the posterior limb of internal capsule on the right extending into the corona radiata, consistent with acute/subacute infarct. Remote infarct is seen in the left basal ganglia region and left side of the pons. Scattered confluent foci of T2 hyperintensity are seen within the white matter of the cerebral hemispheres, nonspecific, more pronounced than expected for patient's age. No hemorrhage, hydrocephalus, extra-axial collection or mass lesion. Vascular: Normal flow voids. Skull and upper cervical spine: Normal marrow signal. Sinuses/Orbits: Mucous retention cyst in the left maxillary sinus. The orbits are maintained. IMPRESSION: 1. Area of restricted diffusion involving the posterior limb of internal capsule on the right extending into the corona radiata, consistent with acute/subacute infarct. 2. Remote infarct in the left basal ganglia region and left side of the pons. 3. Scattered confluent foci of T2 hyperintensity are seen within the white matter of the cerebral hemispheres, nonspecific, more pronounced than expected for patient's age. Findings may represent chronic microvascular ischemic changes. Electronically Signed: By: Baldemar Lenis M.D. On: 10/12/2020 15:22    ECHOCARDIOGRAM COMPLETE   Result Date: 10/13/2020    ECHOCARDIOGRAM REPORT   Patient Name:   Shannon Garza Date of Exam: 10/13/2020 Medical Rec #:  [id number]          Height:       63.0 in Accession #:    [id number]        Weight:       288.0 lb Date of Birth:  Mar 14, 1966          BSA:          2.258 m Patient Age:    54 years          BP:           181/67 mmHg Patient Gender: F                 HR:           54 bpm. Exam Location:  Inpatient Procedure: 2D Echo, Cardiac Doppler and Color Doppler Indications:    CVA  History:        Patient has no prior history of Echocardiogram examinations.                 Stroke; Risk Factors:Hypertension, Diabetes, Dyslipidemia, Sleep  Apnea and Morbid obesity.  Sonographer:    Lavenia AtlasBrooke Strickland Referring Phys: [phone] ANKIT NANAVATI  Sonographer Comments: Patient is morbidly obese and Technically difficult study due to poor echo windows. Image acquisition challenging due to patient body habitus. Hard to hold breast up for apical windows patient too obese!!! IMPRESSIONS  1. Left ventricular ejection fraction, by estimation, is 60 to 65%. The left ventricle has normal function. The left ventricle has no regional wall motion abnormalities. There is moderate left ventricular hypertrophy. Left ventricular diastolic parameters are indeterminate.  2. Right ventricular systolic function was not well visualized. The right ventricular size is not well visualized. Tricuspid regurgitation signal is inadequate for assessing PA pressure.  3. The mitral valve is normal in structure. No evidence of mitral valve regurgitation.  4. The aortic valve is tricuspid. Aortic valve regurgitation is not visualized. No aortic stenosis is present. FINDINGS  Left Ventricle: Left ventricular ejection fraction, by estimation, is 60 to 65%. The left ventricle has normal function. The left ventricle has no regional wall motion abnormalities. The left ventricular internal cavity size was normal in size. There is  moderate left ventricular hypertrophy. Left ventricular diastolic parameters are indeterminate. Right Ventricle: The right ventricular size is not  well visualized. Right vetricular wall thickness was not well visualized. Right ventricular systolic function was not well visualized. Tricuspid regurgitation signal is inadequate for assessing PA pressure. Left Atrium: Left atrial size was normal in size. Right Atrium: Right atrial size was not well visualized. Pericardium: Trivial pericardial effusion is present. Mitral Valve: The mitral valve is normal in structure. No evidence of mitral valve regurgitation. Tricuspid Valve: The tricuspid valve is normal in structure. Tricuspid valve regurgitation is trivial. Aortic Valve: The aortic valve is tricuspid. Aortic valve regurgitation is not visualized. No aortic stenosis is present. Pulmonic Valve: The pulmonic valve was not well visualized. Pulmonic valve regurgitation is not visualized. Aorta: The aortic root and ascending aorta are structurally normal, with no evidence of dilitation. IAS/Shunts: The interatrial septum was not well visualized.  LEFT VENTRICLE PLAX 2D LVIDd:         4.20 cm  Diastology LVIDs:         2.80 cm  LV e' medial:    6.53 cm/s LV PW:         1.50 cm  LV E/e' medial:  9.6 LV IVS:        1.40 cm  LV e' lateral:   4.68 cm/s LVOT diam:     2.30 cm  LV E/e' lateral: 13.5 LV SV:         59 LV SV Index:   26 LVOT Area:     4.15 cm  RIGHT VENTRICLE RV S prime:     5.98 cm/s LEFT ATRIUM             Index       RIGHT ATRIUM           Index LA diam:        4.80 cm 2.13 cm/m  RA Area:     22.30 cm LA Vol (A2C):   45.7 ml 20.24 ml/m RA Volume:   69.40 ml  30.74 ml/m LA Vol (A4C):   67.4 ml 29.85 ml/m LA Biplane Vol: 58.5 ml 25.91 ml/m  AORTIC VALVE LVOT Vmax:   80.30 cm/s LVOT Vmean:  48.400 cm/s LVOT VTI:    0.142 m  AORTA Ao Root diam: 3.00 cm MITRAL VALVE MV Area (PHT): 2.48 cm  SHUNTS MV Decel Time: 306 msec    Systemic VTI:  0.14 m MV E velocity: 63.00 cm/s  Systemic Diam: 2.30 cm MV A velocity: 65.10 cm/s MV E/A ratio:  0.97 Epifanio Lesches MD Electronically signed by Epifanio Lesches MD Signature Date/Time: 10/13/2020/4:53:27 PM    Final     CT ANGIO HEAD CODE STROKE   Result Date: 10/13/2020 CLINICAL DATA:  Acute infarct on MRI EXAM: CT ANGIOGRAPHY HEAD AND NECK TECHNIQUE: Multidetector CT imaging of the head and neck was performed using the standard protocol during bolus administration of intravenous contrast. Multiplanar CT image reconstructions and MIPs were obtained to evaluate the vascular anatomy. Carotid stenosis measurements (when applicable) are obtained utilizing NASCET criteria, using the distal internal carotid diameter as the denominator. CONTRAST:  75mL OMNIPAQUE IOHEXOL 350 MG/ML SOLN COMPARISON:  10/12/2020 FINDINGS: CT HEAD Brain: There is no acute intracranial hemorrhage or mass effect. There is subtle low-attenuation in the right corona radiata corresponding to infarction on MRI. Chronic infarct of the left basal ganglia and adjacent white matter. Additional patchy and confluent areas of hypoattenuation in the supratentorial white likely reflect age advanced chronic microvascular ischemic changes. Ventricles are stable size. There is no extra-axial fluid collection. Vascular: No new findings. Skull: Calvarium is unremarkable. Sinuses/Orbits: No acute finding. Other: None. Review of the MIP images confirms the above findings CTA NECK Aortic arch: Great vessel origins are patent. Right carotid system: Patent. No measurable stenosis at the ICA origin. Left carotid system: Patent. No measurable stenosis at the ICA origin. Vertebral arteries: Patent and codominant. Skeleton: Mild cervical spine degenerative changes. Other neck: No significant abnormality. Upper chest: No apical lung mass. Review of the MIP images confirms the above findings CTA HEAD Anterior circulation: Intracranial internal carotid arteries patent with atherosclerotic irregularity primarily along the cavernous segments, right greater left. Irregular noncalcified plaque is present the floor of the  posterior genu. There is a 3 x 2 mm inferiorly directed aneurysm the distal supraclinoid right ICA. Anterior cerebral arteries are patent. Hypoplastic right A1 ACA. Middle cerebral arteries are patent. There is mild atherosclerotic irregularity of distal branches. Posterior circulation: Intracranial vertebral arteries, basilar artery, and posterior cerebral arteries are patent. Venous sinuses: Patent as allowed by contrast bolus timing. Review of the MIP images confirms the above findings IMPRESSION: No acute intracranial hemorrhage. Evolving acute infarction of the corona radiata better seen on MRI. No hemodynamically significant stenosis in the neck. Right greater than left intracranial internal carotid atherosclerosis. Irregular noncalcified plaque the floor of the posterior genu. Mild atherosclerotic irregularity of distal MCA branches bilaterally. 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 Electronically Signed   By: Guadlupe Spanish M.D.   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 Does the need for close, 24 hr/day medical supervision in concert with the patient's rehab needs make it unreasonable for this patient to be served in a less intensive setting? Yes 2. Co-Morbidities requiring supervision/potential complications: HTN, DM, morbid obesity 3. Due to bladder management, bowel management, safety, skin/wound care, disease management, medication administration, pain management and patient education, does the patient require 24 hr/day rehab nursing? Yes 4. Does the patient require coordinated care of a physician, rehab nurse, therapy disciplines of PT, OT, SLP  to address physical and functional deficits in the context of the above medical diagnosis(es)? Yes Addressing deficits in the following areas: balance, endurance, locomotion, strength, transferring, bowel/bladder control,  bathing, dressing, feeding, grooming,  toileting, cognition, speech and psychosocial support 5. Can the patient actively participate in an intensive therapy program of at least 3 hrs of therapy per day at least 5 days per week? Yes 6. The potential for patient to make measurable gains while on inpatient rehab is excellent 7. Anticipated functional outcomes upon discharge from inpatient rehab are modified independent and supervision  with PT, modified independent and supervision with OT, modified independent and supervision with SLP. 8. Estimated rehab length of stay to reach the above functional goals is: 12-17 days 9. Anticipated discharge destination: Home 10. Overall Rehab/Functional Prognosis: excellent   RECOMMENDATIONS: This patient's condition is appropriate for continued rehabilitative care in the following setting: CIR Patient has agreed to participate in recommended program. Yes Note that insurance prior authorization may be required for reimbursement for recommended care.   Comment: Rehab Admissions Coordinator to follow up.   Thanks,   Ranelle Oyster, MD, [place] [facility]   I have personally performed a face to face diagnostic evaluation of this patient. Additionally, I have examined pertinent labs and radiographic images. I have reviewed and concur with the physician assistant's documentation above.     Charlton Amor, PA-C 10/14/2020          Revision History                        Routing History                   Note Details  Author Ranelle Oyster, MD File Time 10/14/2020 10:57 AM  Author Type Physician Status Signed  Last Editor Ranelle Oyster, MD Service Physical Medicine and Rehabilitation

## 2020-10-18 NOTE — Progress Notes (Addendum)
Family Medicine Teaching Service Daily Progress Note Intern Pager: [phone]  Patient name: Shannon Garza Medical record number: [id number] Date of birth: 10/25/1965 Age: 55 y.o. Gender: female  Primary Care Provider: Patient, No Pcp Per Consultants: Neurology  Code Status: Full  Pt Overview and Major Events to Date:  2/15: Admitted   Assessment and Plan: Shannon Labordeis a 55 y.o. femalepresenting with acute CVAof right internal capsule with left sided weakness, continues to have left sided deficits. PMH is significant forhistory of CVA(2018), DM, HTN.  Patient is medically stable for discharge to CIR.  Acute CVA: Stable  Some residual left sided weakness noted along UE. MRI with acute/subacute infarct involving posterior limb of internal capsule on the right extending into the corona radiata.   - Neuro has signed off: DAPT for 3 weeks followed by ASA alone:aspirin 81 mg and Plavix 75 mgdaily x 3 wks, then ASA 81mg  QD -conservative management with regards to carotid imaging per neuro recs - fall precautions  - PT, OT, speech consults: recommended CIR(consult placed), awaiting insurance authorization  Constipation Improving. Started on mag citrate and senna on 2/19.  - cont miralax qd  - cont senna qd prn - enema prn severe constipation  Chest pain Resolved. EKG stable with negative troponins.  Denies complaints this morning. -Continue to monitor  Hypertension Hypertensive this morning, BP 169/74. Has not received lisinopril yet, scheduled for 10:00 this morning. -Labetalol as needed ordered - lisinopril 20 mg, consider increasing dose  Diabetes Blood glucose 101 this morning. Currently diet controlled at home.Recent A1c 5.6. Used to take Metformin but PCP discontinued.Glucose has been well controlled. -monitor blood glucose  Hyperlipidemia  Lipid panel notable for total cholesterol 209 and LDL 144. Goal <70. - Continue atorvastatin--this was given  late in hospital stay due to strong patient preference to stay off of statin therapy (able to discuss over multiple days)   FEN/GI: vegan diet  PPx: Lovenox    Status is: Inpatient, awaiting CIR    Dispo:  Patient From: Home  Planned Disposition: Inpatient Rehab  Expected discharge date: 10/18/2020  Medically stable for discharge: Yes          Subjective:  No acute overnight events. Denies any pain, feels well. Denies any concerns at this time.    Objective: Temp:  [97.3 F (36.3 C)-98.4 F (36.9 C)] 98.4 F (36.9 C) (02/21 0516) Pulse Rate:  [53-74] 56 (02/21 0516) Resp:  [18] 18 (02/21 0516) BP: (150-169)/(59-87) 169/74 (02/21 0516) SpO2:  [97 %-100 %] 100 % (02/21 0516) Physical Exam: General: Patient laying comfortably in bed with headphones on watching on her ipad, in no acute distress. Cardiovascular: RRR, no murmurs or gallops auscultated  Respiratory: CTAB, normal WOB Abdomen: soft, nontender, presence of bowel sounds Extremities: no LE edema, radial and distal pulses strong and equal bilaterally Neuro: AOx4, CN 2-21 grossly intact, 0/5 left grip strength, 5/5 right grip strength, gross sensation intact, 2/5 left UE strength, 5/5 right UE strength, 5/5 LE strength bilaterally Psych: mood appropriate   Laboratory: Recent Labs  Lab 10/12/20 1009 10/12/20 1841  WBC 6.4 6.0  HGB 13.4 12.4  HCT 41.5 39.0  PLT 168 155   Recent Labs  Lab 10/12/20 1009 10/12/20 1841 10/14/20 0347 10/16/20 0437 10/18/20 0330  NA 138   < > 138 139 140  K 3.2*   < > 3.2* 3.9 3.7  CL 101   < > 104 103 107  CO2 26   < > 23 24 25  BUN 8   < > 5* 8 11  CREATININE 0.73   < > 0.75 0.86 0.80  CALCIUM 10.1   < > 10.1 10.2 10.0  PROT 7.4  --   --   --   --   BILITOT 0.6  --   --   --   --   ALKPHOS 68  --   --   --   --   ALT 13  --   --   --   --   AST 25  --   --   --   --   GLUCOSE 126*   < > 111* 108* 101*   < > = values in this interval not displayed.       Imaging/Diagnostic Tests: No results found.  Reece Leader, DO 10/18/2020, 7:23 AM PGY-1, [facility] Family Medicine FPTS Intern pager: [phone], text pages welcome

## 2020-10-18 NOTE — Progress Notes (Signed)
Patient ID: Shannon Garza, female   DOB: [birth year], 55 y.o.   MRN: [id number] Admit to unit, oriented to rehab, routine, plan of care and medications. Pamelia Hoit

## 2020-10-18 NOTE — Progress Notes (Signed)
Pt pushed off unit by this RN and the NT. All belonging bagged up and transported on the bed with pt. CCMD called and notified of discharge.

## 2020-10-18 NOTE — Discharge Summary (Addendum)
Family Medicine Teaching [facility] [employer] Discharge Summary  Patient name: Shannon Garza Medical record number: [id number] Date of birth: Nov 10, 1965 Age: 55 y.o. Gender: female Date of Admission: 10/12/2020  Date of Discharge: 10/18/2020 Admitting Physician: Mirian Mo, MD  Primary Care Provider: Patient, No Pcp Per Consultants:   Indication for Hospitalization: left-sided weakness  Discharge Diagnoses/Problem List:  Acute CVA Diabetes Hypertension   Disposition: CIR  Discharge Condition: medically stable  Discharge Exam:  Temp:  [97.3 F (36.3 C)-98.4 F (36.9 C)] 98.4 F (36.9 C) (02/21 0516) Pulse Rate:  [53-74] 56 (02/21 0516) Resp:  [18] 18 (02/21 0516) BP: (150-169)/(59-87) 169/74 (02/21 0516) SpO2:  [97 %-100 %] 100 % (02/21 0516) Physical Exam: General: Patient laying comfortably in bed with headphones on watching on her ipad, in no acute distress. Cardiovascular: RRR, no murmurs or gallops auscultated  Respiratory: CTAB, normal WOB Abdomen: soft, nontender, presence of bowel sounds Extremities: no LE edema, radial and distal pulses strong and equal bilaterally Neuro: AOx4, CN 2-21 grossly intact, 0/5 left grip strength, 5/5 right grip strength, gross sensation intact, 2/5 left UE strength, 5/5 right UE strength, 5/5 LE strength bilaterally Psych: mood appropriate   Brief Hospital Course:  Shannon Garza is a 55 y.o. right handed female who presented with left-sided weakness of left leg, arm, and face; subsequently diagnosed with acute CVA. PMH is significant for previous CVA, DM, HTN.  On admission, patient noted not taking any medications at home.    Acute CVA secondary to long standing hypertension Patient presented to ED for left-sided weakness in left leg, arm, and face, subsequently diagnosed with acute CVA outside of the TPA window. This presentation very similar to her previous stroke.  Initial CT negative for acute hemorrhage.  MR brain showed findings  consistent with acute/subacute infarct of the internal capsule in addition to remote infarct of left basal ganglia and left pons.  PMH of diabetes and hypertension, takes no medications at home at this time.  Permissive hypertension in the first 48 hours up to 220 systolic, 110 diastolic. Neurology was consulted, recommended DAPT x3 weeks, then ASA 81mg  only.  Also recommended conservative management for carotid stenosis noted on imaging.  Lipid panel significant for total cholesterol 209, LDL 144; otherwise normal with total CHOL/HDL ratio 4.0, HDL 52, triglycerides 66, VLDL 13.  She was started on Lipitor 40mg  QD. PT, OT, SLP consulted and recommended CIR.  On day of discharge, patient maintained baseline neurologic status with residual left-sided upper extremity deficit.  Patient discharged on  aspirin 81 mg and plavix 75 mg daily for anticoagulation.  Hypertension On admission, patient systolic pressures ranging 170-194.  Known history of hypertension, without home medications at this time.  In setting of acute CVA, allowed permissive hypertension up to 220 systolic, 110 diastolic for the first 48 hours.  After 48 hours, patient's blood pressure was managed with lisinopril which was titrated to 20 mg. Patient discharged on anti-hypertensive regimen of lisinopril monotherapy.   All other issues chronic and stable.   Issues for Follow Up:  She was discharged on DAPT for 3 weeks.  She should take ASA 81mg  and Plavix 75mg  for 3 weeks, then she should take Aspirin 81mg  ONLY. Continue atorvastatin 40 mg daily on discharge.  This was started during hospitalization after multiple conversations about importance with patient. She was started on Lisinopril 20 mg during hospitalization.  Recommend monitoring BP as outpatient and BMP in 1 month. Consider outpatient sleep study. Continue to monitor A1, consider  repeat in 3 months.  Significant Procedures:  none  Significant Labs and Imaging:  Recent Labs   Lab 10/12/20 1009 10/12/20 1841  WBC 6.4 6.0  HGB 13.4 12.4  HCT 41.5 39.0  PLT 168 155   Recent Labs  Lab 10/12/20 1009 10/12/20 1841 10/13/20 0348 10/14/20 0347 10/16/20 0437 10/18/20 0330  NA 138  --  139 138 139 140  K 3.2*  --  3.9 3.2* 3.9 3.7  CL 101  --  105 104 103 107  CO2 26  --  24 23 24 25   GLUCOSE 126*  --  104* 111* 108* 101*  BUN 8  --  6 5* 8 11  CREATININE 0.73 0.76 0.75 0.75 0.86 0.80  CALCIUM 10.1  --  9.8 10.1 10.2 10.0  ALKPHOS 68  --   --   --   --   --   AST 25  --   --   --   --   --   ALT 13  --   --   --   --   --   ALBUMIN 4.1  --   --   --   --   --       Results/Tests Pending at Time of Discharge:  none  Discharge Medications:  Allergies as of 10/18/2020   No Known Allergies      Medication List     STOP taking these medications    DANDELION PO   HAWTHORN PO   MAGNESIUM PO   POTASSIUM PO   vitamin C 500 MG tablet Commonly known as: ASCORBIC ACID       TAKE these medications    acetaminophen 325 MG tablet Commonly known as: TYLENOL Take 2 tablets (650 mg total) by mouth every 4 (four) hours as needed for mild pain (or temp > 37.5 C (99.5 F)).   aspirin 81 MG chewable tablet Chew 1 tablet (81 mg total) by mouth daily. Start taking on: October 19, 2020   atorvastatin 40 MG tablet Commonly known as: LIPITOR Take 1 tablet (40 mg total) by mouth at bedtime.   clopidogrel 75 MG tablet Commonly known as: PLAVIX Take 1 tablet (75 mg total) by mouth daily. Continue through March 8 then aspirin alone Start taking on: October 19, 2020   IRON PO Take 1 tablet by mouth daily.   lisinopril 20 MG tablet Commonly known as: ZESTRIL Take 1 tablet (20 mg total) by mouth daily. Start taking on: October 19, 2020   QC TUMERIC COMPLEX PO Take 1 tablet by mouth daily.        Discharge Instructions: Please refer to Patient Instructions section of EMR for full details.  Patient was counseled important signs and  symptoms that should prompt return to medical care, changes in medications, dietary instructions, activity restrictions, and follow up appointments.   Follow-Up Appointments:  Follow-up Information     October 21, 2020, MD. Schedule an appointment as soon as possible for a visit in 3 week(s).   Specialty: Family Medicine Why: Make a hospital follow up appointment with Dr. Mirian Mo of [facility] after you are discharged from CIR.  Contact information: [address] [place] [phone]                 [phone], DO 10/18/2020, 4:04 PM PGY-1, [facility] Family [facility]

## 2020-10-18 NOTE — Progress Notes (Signed)
Orthopedic Tech Progress Note Patient Details:  Shannon Garza Nov 20, 1965 [id number] Called in order to [employer] for a RESTING HAND SPLINT Patient ID: Shannon Garza, female   DOB: 05-Jul-1966, 55 y.o.   MRN: [id number]   [name] 10/18/2020, 3:55 PM

## 2020-10-18 NOTE — Progress Notes (Signed)
Physical Therapy Treatment Patient Details Name: Shannon Garza MRN: [id number] DOB: [birth year] Today's Date: 10/18/2020    History of Present Illness Pt is a 55 y.o. female who presents with acute L-sided limb and facial weakness. MRI revealed acute/subacute infarct of R posterior internal capsule and remote infarct of L basal ganglia and L pons. Outside window for tPA. PMH: CVA 4 years ago, HTN, and DM2.    PT Comments    Today's skilled session continued to focus on mobility progression. Pt excited to report increased movement of left UE, however continued with a weak grip and may benefit from a hand orthotic on walker unless she progresses to a cane. Will defer to CIR as pt is transferring there today. Acute PT to continue while pt is on acute side.    Follow Up Recommendations  CIR;Supervision for mobility/OOB     Equipment Recommendations  Rolling walker with 5" wheels;3in1 (PT)    Recommendations for Other Services Rehab consult     Precautions / Restrictions Precautions Precautions: Fall Restrictions Other Position/Activity Restrictions: acute LUE weakness    Mobility  Bed Mobility Overal bed mobility: Needs Assistance Bed Mobility: Supine to Sit     Supine to sit: Min assist;HOB elevated     General bed mobility comments: with HOB ~30 degrees cues on technique and use of right UE to elevate trunk into sitting. pt able to advance bil LE's to/off edge of bed. assist/cues to bring left shoulder/arm forward with transition to sitting at edge of bed.    Transfers Overall transfer level: Needs assistance Equipment used: Rolling walker (2 wheeled) Transfers: Sit to/from Stand Sit to Stand: Min assist         General transfer comment: min assist to power up into standing with cues/faciliation for anterior weight shifting and hand placement (pt with left UE on RW and right UE pushing from bed).  Ambulation/Gait Ambulation/Gait assistance: Min assist Gait  Distance (Feet): 6 Feet Assistive device: Rolling walker (2 wheeled) Gait Pattern/deviations: Step-through pattern;Decreased step length - right;Decreased step length - left;Decreased stride length;Decreased dorsiflexion - left;Trunk flexed Gait velocity: reduced   General Gait Details: from bed to recliner. distance limited this session due to second person assist not available. pt with minimal left LE instability noted with the short distance. Does continue to have poor foot clearance. May benefit from hand orthotic for left UE until grip strength improved. Will defer this to OT/CIR.    Modified Rankin (Stroke Patients Only) Modified Rankin (Stroke Patients Only) Pre-Morbid Rankin Score: No symptoms Modified Rankin: Moderately severe disability     Cognition Arousal/Alertness: Awake/alert Behavior During Therapy: WFL for tasks assessed/performed;Impulsive Overall Cognitive Status: Within Functional Limits for tasks assessed                                 General Comments: pt following all commands and answers questions appropriately. Did need reminder cues to bring left arm along with her when moving from supine to sitting edge of bed, was advancing left LE without cues.      Exercises General Exercises - Lower Extremity Ankle Circles/Pumps: AROM;Strengthening;Both;Supine;Limitations Ankle Circles/Pumps Limitations: with manual resistance for PF Straight Leg Raises: AROM;AAROM;Strengthening;Left;10 reps;Supine;Limitations Straight Leg Raises Limitations: assist to decrease extension lag with each rep.     Pertinent Vitals/Pain Pain Assessment: No/denies pain     PT Goals (current goals can now be found in the care plan section) Acute Rehab PT  Goals Patient Stated Goal: regain independence, return to work PT Goal Formulation: With patient Time For Goal Achievement: 10/27/20 Potential to Achieve Goals: Good Progress towards PT goals: Progressing toward  goals    Frequency    Min 4X/week      PT Plan Current plan remains appropriate    AM-PAC PT "6 Clicks" Mobility   Outcome Measure  Help needed turning from your back to your side while in a flat bed without using bedrails?: A Little Help needed moving from lying on your back to sitting on the side of a flat bed without using bedrails?: A Little Help needed moving to and from a bed to a chair (including a wheelchair)?: A Little Help needed standing up from a chair using your arms (e.g., wheelchair or bedside chair)?: A Little Help needed to walk in hospital room?: A Lot Help needed climbing 3-5 steps with a railing? : A Lot 6 Click Score: 16    End of Session Equipment Utilized During Treatment: Gait belt Activity Tolerance: Patient tolerated treatment well Patient left: in chair;with call bell/phone within reach;with chair alarm set Nurse Communication: Mobility status PT Visit Diagnosis: Unsteadiness on feet (R26.81);Other abnormalities of gait and mobility (R26.89);Muscle weakness (generalized) (M62.81);Difficulty in walking, not elsewhere classified (R26.2);Other symptoms and signs involving the nervous system (R29.898);Hemiplegia and hemiparesis Hemiplegia - Right/Left: Left Hemiplegia - dominant/non-dominant: Non-dominant Hemiplegia - caused by: Cerebral infarction     Time: 8841-6606 PT Time Calculation (min) (ACUTE ONLY): 19 min  Charges:  $Therapeutic Activity: 8-22 mins                     Sallyanne Kuster, PTA, CLT Acute Rehab Services Office- [phone] 10/18/20, 11:03 AM   Sallyanne Kuster 10/18/2020, 11:01 AM

## 2020-10-18 NOTE — Significant Event (Signed)
Patient c/o numbness in left side of face at mouth and cheek area. No other changes in neurological status. Dan Angiulli PAC. Noted Dr. Roda Shutters aware of transient facial numbness and tingling in left side of face, reported CT scan not warranted at this time. Neuro checks x 2 hours and notify if changes or increase in symptoms. Reassured patient of information from [facility]. Pulse is better than on admission however BP is higher than on admission to rehab. Patient is concerned about extention and worsening of symptoms outcome. Reinforced staff to check on her routinely, no additional medications due at this time. Pamelia Hoit

## 2020-10-18 NOTE — TOC Transition Note (Signed)
Transition of Care [facility]) - CM/SW Discharge Note   Patient Details  Name: Javiana Anwar MRN: [id number] Date of Birth: 12/03/1965  [facility]) CM/SW Contact:  Kermit Balo, RN Phone Number: 10/18/2020, 10:50 AM   Clinical Narrative:    Pt discharging to CIR today. CM signing off.   Final next level of care: IP Rehab Facility Barriers to Discharge: No Barriers Identified   Patient Goals and CMS Choice     Choice offered to / list presented to : Patient  Discharge Placement                       Discharge Plan and Services   Discharge Planning Services: CM Consult Post Acute Care Choice: IP Rehab                               Social Determinants of Health (SDOH) Interventions     Readmission Risk Interventions No flowsheet data found.

## 2020-10-18 NOTE — Progress Notes (Signed)
Inpatient Rehabilitation Admissions Coordinator  I have CIR bed and insurance approval to admit her today. I met with patient at bedside and she is in agreement. I have spoken with Resident, as well as alerted acute team and TOC. I will make the arrangements to admit today.  Danne Baxter, RN, MSN Rehab Admissions Coordinator [phone] 10/18/2020 10:43 AM

## 2020-10-18 NOTE — Progress Notes (Signed)
Inpatient Rehabilitation Medication Review by a Pharmacist  A complete drug regimen review was completed for this patient to identify any potential clinically significant medication issues.  Clinically significant medication issues were identified:  Yes   Type of Medication Issue Identified Description of Issue Urgent (address now) Non-Urgent (address on AM team rounds) Plan Plan Accepted by Provider? (Yes / No / Pending AM Rounds)  Drug Interaction(s) (clinically significant)       Duplicate Therapy       Allergy       No Medication Administration End Date       Incorrect Dose       Additional Drug Therapy Needed       Other  Consider increasing dose of Lovenox prophylaxis to 0.5 mg/kg (65 mg) SQ daily for BMI >50 Non urgent Pending review by CIR team on AM rounds Pending CIR rounds    Name of provider notified for urgent issues identified:  N/A  For non-urgent medication issues to be resolved on team rounds tomorrow morning a [facility] Secure Chat Handoff was sent to:  Deatra Ina, PA  Time spent performing this drug regimen review (minutes):  15 min  Vicki Mallet, PharmD, BCPS, [facility] Clinical Pharmacist 10/18/2020 3:07 PM

## 2020-10-18 NOTE — Progress Notes (Signed)
Patient ID: Shannon Garza, female   DOB: 1966/05/22, 55 y.o.   MRN: [id number] Recheck BP with dynamap noted BP higher than previous note. Proivider on call notified of BP and requested manual reading which was more in line with admission BP 160/78 and pulse 64. Patient is resting and noted that numbness in face has subsided. Feeling more at ease. Patient has been toileted and sleep apnea monitor was applied within the past hour. No other change in assessment noted. Pamelia Hoit Explained situation to patient and noted that vitals should be monitored with manual BP cuff.

## 2020-10-18 NOTE — Progress Notes (Signed)
Inpatient Rehabilitation  Patient information reviewed and entered into eRehab system by Murvin Gift M. Paydon Carll, M.A., CCC/SLP, PPS Coordinator.  Information including medical coding, functional ability and quality indicators will be reviewed and updated through discharge.    

## 2020-10-18 NOTE — H&P (Signed)
Physical Medicine and Rehabilitation Admission H&P         Chief Complaint  Patient presents with  . Back Pain      lower  : HPI: Shannon Garza is a 55 year old right-handed female with history of prediabetes, CVA 2018 and hypertension.  Patient opted for herbal medicine route rather than taking other medications after CVA.  Per chart review patient lives with her brother sister-in-law and sister.  Independent prior to admission works from home.  Multilevel home bed and bath upstairs 2 steps to entry.  Sister can provide assistance.  Presented 10/12/2020 with left-sided weakness 10/11/2020.  Noted blood pressure systolic 163-194.  CT/MRI showed area of restricted diffusion involving the posterior limb of internal capsule on the right extending into the corona radiata consistent with acute/subacute infarction.  Remote infarct in left basal ganglia and left side of the pons.  CT angiogram of the head and neck no hemodynamically significant stenosis in the neck.  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  Echocardiogram with ejection fraction of 60 to 65% no wall motion abnormalities.  Admission chemistries urine drug screen negative potassium 3.2 glucose 126 alcohol negative hemoglobin A1c 5.6.  Currently maintained on aspirin and Plavix for CVA prophylaxis x3 weeks and aspirin alone.  Subcutaneous Lovenox for DVT prophylaxis.  Tolerating a regular diet.  Therapy evaluations completed with recommendations of physical medicine rehab consult to the left side weakness and patient was admitted for a comprehensive rehab program.   Review of Systems  Constitutional: Negative for chills and fever.  HENT: Negative for hearing loss.   Eyes: Negative for blurred vision and double vision.  Respiratory: Negative for cough and shortness of breath.   Cardiovascular: Negative for chest pain, palpitations and leg swelling.  Gastrointestinal: Positive for constipation. Negative for heartburn,  nausea and vomiting.  Genitourinary: Negative for dysuria, flank pain and hematuria.  Musculoskeletal: Positive for myalgias.  Skin: Negative for rash.  Neurological: Positive for weakness and headaches.  All other systems reviewed and are negative.       Past Medical History:  Diagnosis Date  . Diabetes mellitus without complication (HCC)    . Hypertension      History reviewed. No pertinent surgical history. History reviewed. No pertinent family history. Social History:  reports that she has never smoked. She has never used smokeless tobacco. She reports previous alcohol use. She reports previous drug use. Allergies: No Known Allergies       Medications Prior to Admission  Medication Sig Dispense Refill  . DANDELION PO Take 1 tablet by mouth daily.      . Ferrous Sulfate (IRON PO) Take 1 tablet by mouth daily.      Marland Kitchen HAWTHORN PO Take 1 tablet by mouth daily.      Marland Kitchen MAGNESIUM PO Take 1 tablet by mouth daily.      Marland Kitchen POTASSIUM PO Take 1 tablet by mouth daily. Over the counter      . Turmeric (QC TUMERIC COMPLEX PO) Take 1 tablet by mouth daily.      . vitamin C (ASCORBIC ACID) 500 MG tablet Take 1,000 mg by mouth daily.          Drug Regimen Review Drug regimen was reviewed and remains appropriate with no significant issues identified   Home: Home Living Family/patient expects to be discharged to:: Private residence Living Arrangements: Other relatives (brother, sister, and sister-in-law) Available Help at Discharge: Family,Available 24 hours/day Type  of Home: House Home Access: Stairs to enter [employer] of Steps: 2 Entrance Stairs-Rails: Right,Left,Can reach both Home Layout: Multi-level,Bed/bath upstairs Alternate Level Stairs-Number of Steps: full flight Alternate Level Stairs-Rails: Left Bathroom Shower/Tub: Health visitor: Handicapped height Home Equipment: Hand held shower head,Shower seat - built in Additional Comments: typically uses  tub shower in her room. Could potentially have access to walkin shower with built in seat in brother's bathroom.   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 Pt drives.   Functional Status:  Mobility: Bed Mobility Overal bed mobility: Needs Assistance Bed Mobility: Supine to Sit Rolling: Min guard (partial roll to come to EOB) Sidelying to sit: Mod assist Supine to sit: Min guard General bed mobility comments: Pt able to transition to sit R EOB with use of bed rails and extra time and effort, min guard for safety cuing for L arm and leg management. Transfers Overall transfer level: Needs assistance Equipment used: Rolling walker (2 wheeled) Transfers: Sit to/from Stand Sit to Stand: Min assist Stand pivot transfers: Min assist General transfer comment: Cued pt to push up from bed and transition hands to RW, minA for steadying and extra time to power up to stand. No overt LOB. Assistance for L hand placement on RW. Ambulation/Gait Ambulation/Gait assistance: Min assist,Mod assist,+2 safety/equipment Gait Distance (Feet): 30 Feet Assistive device: Rolling walker (2 wheeled) Gait Pattern/deviations: Step-through pattern,Decreased step length - right,Decreased step length - left,Decreased stride length,Staggering left General Gait Details: Pt ambulating with slight L knee instability but no appreciative buckling, providing tactile cues intermittently at L quad during stance. During initial few steps pt had a sudden LOB to the L needing modA to recover safely. Cued pt to only move RW or 1 foot at a time for safety, especially when turning, and to keep RW in contact with ground, minAx1-2 for safety rest of gait bout. Tends to drift and stagger to L, thus blocked pt with PT's body on L throughout for safety and aide posterior to pt for further support. Gait velocity: reduced Gait velocity interpretation: <1.31 ft/sec,  indicative of household ambulator   ADL: ADL Overall ADL's : Needs assistance/impaired Eating/Feeding: Minimal assistance,Sitting Eating/Feeding Details (indicate cue type and reason): assist for bilateral tasks. Impairment is in nondominant UE Grooming: Moderate assistance,Sitting Upper Body Bathing: Moderate assistance,Sitting Lower Body Bathing: Minimal assistance,Sit to/from stand Upper Body Dressing : Moderate assistance,Sitting Lower Body Dressing: Minimal assistance,Sit to/from stand Toilet Transfer: Minimal assistance Toilet Transfer Details (indicate cue type and reason): steadying assist to take pivotal steps to recliner Toileting- Clothing Manipulation and Hygiene: Minimal assistance,Sit to/from stand Tub/ Shower Transfer: Tub transfer,Minimal assistance Functional mobility during ADLs: Minimal assistance (steadying assist to take pivotal steps to recliner) General ADL Comments: Presents with LUE deficits in strength (3-/5 at all levels) and coordination, possibly some mild L side neglect? impacting UB/LB ADLs. A bit impulsive. steadying assist to take pivtal steps EOB to recliner   Cognition: Cognition Overall Cognitive Status: No family/caregiver present to determine baseline cognitive functioning Arousal/Alertness: Awake/alert Orientation Level: Oriented X4 Attention: Focused,Sustained Focused Attention: Appears intact Sustained Attention: Appears intact Memory: Impaired Memory Impairment: Retrieval deficit,Storage deficit,Decreased recall of new information (Immediate: 5/5; delayed: 4/5; with cue: 1/1) Awareness: Impaired Awareness Impairment: Emergent impairment Problem Solving: Impaired Problem Solving Impairment: Verbal complex Executive Function: Reasoning,Sequencing,Organizing Reasoning: Appears intact Sequencing: Impaired Sequencing Impairment: Verbal complex (clockdrawing: 0/4) Organizing Impairment: Verbal complex (backward digit span:  0/2) Cognition Arousal/Alertness: Awake/alert  Behavior During Therapy: WFL for tasks assessed/performed,Impulsive Overall Cognitive Status: No family/caregiver present to determine baseline cognitive functioning General Comments: Pt able to overall answer questions appropriately, A&Ox4. Pt with poor insight into deficits and safety awareness, needing cues to sequence mobility with RW for safety.   Physical Exam: Blood pressure (!) 145/90, pulse 76, temperature 98.7 F (37.1 C), temperature source Oral, resp. rate 20, height 5\' 3"  (1.6 m), weight 130.6 kg, SpO2 100 %. Physical Exam Constitutional:      General: She is not in acute distress.    Appearance: She is obese. She is not ill-appearing.  HENT:     Head: Normocephalic and atraumatic.     Right Ear: External ear normal.     Left Ear: External ear normal.     Nose: Nose normal.     Mouth/Throat:     Mouth: Mucous membranes are moist.     Pharynx: Oropharynx is clear.  Eyes:     Extraocular Movements: Extraocular movements intact.     Pupils: Pupils are equal, round, and reactive to light.  Cardiovascular:     Rate and Rhythm: Normal rate and regular rhythm.     Heart sounds: No murmur heard. No gallop.   Pulmonary:     Effort: Pulmonary effort is normal. No respiratory distress.     Breath sounds: No wheezing.  Abdominal:     General: Bowel sounds are normal. There is no distension.     Palpations: Abdomen is soft.     Tenderness: There is no abdominal tenderness. There is no guarding.  Musculoskeletal:        General: No swelling or tenderness. Normal range of motion.     Cervical back: Normal range of motion.     Right lower leg: No edema.     Left lower leg: No edema.  Skin:    General: Skin is warm and dry.  Neurological:     Mental Status: She is alert.     Comments: Patient is alert in no acute distress.  Speech is mildly dysarthric but intelligible.  Oriented x3 and follows commands.  Fair awareness of  deficits. Left central 7. Motor: LUE 2+ deltoid, biceps, triceps, 1/5 wrist and HI. LLE 4/5 prox to distal. Senses pain and LT in all 4's. No resting tone .DTR's 1+.   Psychiatric:        Mood and Affect: Mood normal.        Behavior: Behavior normal.        Lab Results Last 48 Hours        Results for orders placed or performed during the hospital encounter of 10/12/20 (from the past 48 hour(s))  HIV Antibody (routine testing w rflx)     Status: None    Collection Time: 10/12/20  6:41 PM  Result Value Ref Range    HIV Screen 4th Generation wRfx Non Reactive Non Reactive      Comment: Performed at [facility], [address] [place], [place] [id number]  CBC     Status: None    Collection Time: 10/12/20  6:41 PM  Result Value Ref Range    WBC 6.0 4.0 - 10.5 K/uL    RBC 4.75 3.87 - 5.11 MIL/uL    Hemoglobin 12.4 12.0 - 15.0 g/dL    HCT 13.039.0 86.536.0 - 78.446.0 %    MCV 82.1 80.0 - 100.0 fL    MCH 26.1 26.0 - 34.0 pg    MCHC 31.8 30.0 -  36.0 g/dL    RDW 57.8 46.9 - 62.9 %    Platelets 155 150 - 400 K/uL      Comment: REPEATED TO VERIFY    nRBC 0.0 0.0 - 0.2 %      Comment: Performed at [facility], [address] [place], [place] [address]  Creatinine, serum     Status: None    Collection Time: 10/12/20  6:41 PM  Result Value Ref Range    Creatinine, Ser 0.76 0.44 - 1.00 mg/dL    GFR, Estimated >32 >44 mL/min      Comment: (NOTE) Calculated using the CKD-EPI Creatinine Equation (2021) Performed at [facility], [address], [place], [place] [address]    Glucose, capillary     Status: None    Collection Time: 10/13/20 12:47 AM  Result Value Ref Range    Glucose-Capillary 97 70 - 99 mg/dL      Comment: Glucose reference range applies only to samples taken after fasting for at least 8 hours.  Hemoglobin A1c     Status: None    Collection Time: 10/13/20  3:48 AM  Result Value Ref Range    Hgb A1c MFr Bld 5.6 4.8 - 5.6 %      Comment: (NOTE)          Prediabetes: 5.7 - 6.4         Diabetes: >6.4         Glycemic control for adults with diabetes: <7.0      Mean Plasma Glucose 114 mg/dL      Comment: (NOTE) Performed At: [facility] [address] [place], [place] [id number] Jolene Schimke MD KV:[id number]    Lipid panel     Status: Abnormal    Collection Time: 10/13/20  3:48 AM  Result Value Ref Range    Cholesterol 209 (H) 0 - 200 mg/dL    Triglycerides 66 <643 mg/dL    HDL 52 >32 mg/dL    Total CHOL/HDL Ratio 4.0 RATIO    VLDL 13 0 - 40 mg/dL    LDL Cholesterol 951 (H) 0 - 99 mg/dL      Comment:        Total Cholesterol/HDL:CHD Risk Coronary Heart Disease Risk Table                     Men   Women  1/2 Average Risk   3.4   3.3  Average Risk       5.0   4.4  2 X Average Risk   9.6   7.1  3 X Average Risk  23.4   11.0        Use the calculated Patient Ratio above and the CHD Risk Table to determine the patient's CHD Risk.        ATP III CLASSIFICATION (LDL):  <100     mg/dL   Optimal  884-166  mg/dL   Near or Above                    Optimal  130-159  mg/dL   Borderline  063-016  mg/dL   High  >010     mg/dL   Very High Performed at [facility], [address] [place], [place] [address]    Basic metabolic panel     Status: Abnormal    Collection Time: 10/13/20  3:48 AM  Result Value Ref Range    Sodium 139  135 - 145 mmol/L    Potassium 3.9 3.5 - 5.1 mmol/L    Chloride 105 98 - 111 mmol/L    CO2 24 22 - 32 mmol/L    Glucose, Bld 104 (H) 70 - 99 mg/dL      Comment: Glucose reference range applies only to samples taken after fasting for at least 8 hours.    BUN 6 6 - 20 mg/dL    Creatinine, Ser 2.20 0.44 - 1.00 mg/dL    Calcium 9.8 8.9 - 25.4 mg/dL    GFR, Estimated >27 >06 mL/min      Comment: (NOTE) Calculated using the CKD-EPI Creatinine Equation (2021)      Anion gap 10 5 - 15      Comment: Performed at [facility], [address] [place], [place] [address]  Basic metabolic panel      Status: Abnormal    Collection Time: 10/14/20  3:47 AM  Result Value Ref Range    Sodium 138 135 - 145 mmol/L    Potassium 3.2 (L) 3.5 - 5.1 mmol/L    Chloride 104 98 - 111 mmol/L    CO2 23 22 - 32 mmol/L    Glucose, Bld 111 (H) 70 - 99 mg/dL      Comment: Glucose reference range applies only to samples taken after fasting for at least 8 hours.    BUN 5 (L) 6 - 20 mg/dL    Creatinine, Ser 8.31 0.44 - 1.00 mg/dL    Calcium 51.7 8.9 - 61.6 mg/dL    GFR, Estimated >07 >37 mL/min      Comment: (NOTE) Calculated using the CKD-EPI Creatinine Equation (2021)      Anion gap 11 5 - 15      Comment: Performed at [facility], [address] [place], [place] [address]       Imaging Results (Last 48 hours)  CT ANGIO NECK W OR WO CONTRAST   Result Date: 10/13/2020 CLINICAL DATA:  Acute infarct on MRI EXAM: CT ANGIOGRAPHY HEAD AND NECK TECHNIQUE: Multidetector CT imaging of the head and neck was performed using the standard protocol during bolus administration of intravenous contrast. Multiplanar CT image reconstructions and MIPs were obtained to evaluate the vascular anatomy. Carotid stenosis measurements (when applicable) are obtained utilizing NASCET criteria, using the distal internal carotid diameter as the denominator. CONTRAST:  2mL OMNIPAQUE IOHEXOL 350 MG/ML SOLN COMPARISON:  10/12/2020 FINDINGS: CT HEAD Brain: There is no acute intracranial hemorrhage or mass effect. There is subtle low-attenuation in the right corona radiata corresponding to infarction on MRI. Chronic infarct of the left basal ganglia and adjacent white matter. Additional patchy and confluent areas of hypoattenuation in the supratentorial white likely reflect age advanced chronic microvascular ischemic changes. Ventricles are stable size. There is no extra-axial fluid collection. Vascular: No new findings. Skull: Calvarium is unremarkable. Sinuses/Orbits: No acute finding. Other: None. Review of the MIP images  confirms the above findings CTA NECK Aortic arch: Great vessel origins are patent. Right carotid system: Patent. No measurable stenosis at the ICA origin. Left carotid system: Patent. No measurable stenosis at the ICA origin. Vertebral arteries: Patent and codominant. Skeleton: Mild cervical spine degenerative changes. Other neck: No significant abnormality. Upper chest: No apical lung mass. Review of the MIP images confirms the above findings CTA HEAD Anterior circulation: Intracranial internal carotid arteries patent with atherosclerotic irregularity primarily along the cavernous segments, right greater left. Irregular noncalcified plaque is present the floor of  the posterior genu. There is a 3 x 2 mm inferiorly directed aneurysm the distal supraclinoid right ICA. Anterior cerebral arteries are patent. Hypoplastic right A1 ACA. Middle cerebral arteries are patent. There is mild atherosclerotic irregularity of distal branches. Posterior circulation: Intracranial vertebral arteries, basilar artery, and posterior cerebral arteries are patent. Venous sinuses: Patent as allowed by contrast bolus timing. Review of the MIP images confirms the above findings IMPRESSION: No acute intracranial hemorrhage. Evolving acute infarction of the corona radiata better seen on MRI. No hemodynamically significant stenosis in the neck. Right greater than left intracranial internal carotid atherosclerosis. Irregular noncalcified plaque the floor of the posterior genu. Mild atherosclerotic irregularity of distal MCA branches bilaterally. 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 Electronically Signed   By: Guadlupe Spanish M.D.   On: 10/13/2020 14:05    MR Brain Wo Contrast (neuro protocol)   Addendum Date: 10/12/2020   ADDENDUM REPORT: 10/12/2020 15:32 ADDENDUM: These results were called by telephone on 10/12/2020 at 3:30 pm to provider [facility] , who verbally acknowledged these results. Electronically Signed   By:  Baldemar Lenis M.D.   On: 10/12/2020 15:32    Result Date: 10/12/2020 CLINICAL DATA:  Neuro deficit, acute, stroke suspected. EXAM: MRI HEAD WITHOUT CONTRAST TECHNIQUE: Multiplanar, multiecho pulse sequences of the brain and surrounding structures were obtained without intravenous contrast. COMPARISON:  Head CT October 12, 2020 FINDINGS: Brain: Area of restricted diffusion involving the posterior limb of internal capsule on the right extending into the corona radiata, consistent with acute/subacute infarct. Remote infarct is seen in the left basal ganglia region and left side of the pons. Scattered confluent foci of T2 hyperintensity are seen within the white matter of the cerebral hemispheres, nonspecific, more pronounced than expected for patient's age. No hemorrhage, hydrocephalus, extra-axial collection or mass lesion. Vascular: Normal flow voids. Skull and upper cervical spine: Normal marrow signal. Sinuses/Orbits: Mucous retention cyst in the left maxillary sinus. The orbits are maintained. IMPRESSION: 1. Area of restricted diffusion involving the posterior limb of internal capsule on the right extending into the corona radiata, consistent with acute/subacute infarct. 2. Remote infarct in the left basal ganglia region and left side of the pons. 3. Scattered confluent foci of T2 hyperintensity are seen within the white matter of the cerebral hemispheres, nonspecific, more pronounced than expected for patient's age. Findings may represent chronic microvascular ischemic changes. Electronically Signed: By: Baldemar Lenis M.D. On: 10/12/2020 15:22    ECHOCARDIOGRAM COMPLETE   Result Date: 10/13/2020    ECHOCARDIOGRAM REPORT   Patient Name:   SARAANN ENNEKING Date of Exam: 10/13/2020 Medical Rec #:  [id number]         Height:       63.0 in Accession #:    [id number]        Weight:       288.0 lb Date of Birth:  September 15, 1965          BSA:          2.258 m Patient Age:    54 years           BP:           181/67 mmHg Patient Gender: F                 HR:           54 bpm. Exam Location:  Inpatient Procedure: 2D Echo, Cardiac Doppler and Color Doppler Indications:  CVA  History:        Patient has no prior history of Echocardiogram examinations.                 Stroke; Risk Factors:Hypertension, Diabetes, Dyslipidemia, Sleep                 Apnea and Morbid obesity.  Sonographer:    Lavenia Atlas Referring Phys: [phone] ANKIT NANAVATI  Sonographer Comments: Patient is morbidly obese and Technically difficult study due to poor echo windows. Image acquisition challenging due to patient body habitus. Hard to hold breast up for apical windows patient too obese!!! IMPRESSIONS  1. Left ventricular ejection fraction, by estimation, is 60 to 65%. The left ventricle has normal function. The left ventricle has no regional wall motion abnormalities. There is moderate left ventricular hypertrophy. Left ventricular diastolic parameters are indeterminate.  2. Right ventricular systolic function was not well visualized. The right ventricular size is not well visualized. Tricuspid regurgitation signal is inadequate for assessing PA pressure.  3. The mitral valve is normal in structure. No evidence of mitral valve regurgitation.  4. The aortic valve is tricuspid. Aortic valve regurgitation is not visualized. No aortic stenosis is present. FINDINGS  Left Ventricle: Left ventricular ejection fraction, by estimation, is 60 to 65%. The left ventricle has normal function. The left ventricle has no regional wall motion abnormalities. The left ventricular internal cavity size was normal in size. There is  moderate left ventricular hypertrophy. Left ventricular diastolic parameters are indeterminate. Right Ventricle: The right ventricular size is not well visualized. Right vetricular wall thickness was not well visualized. Right ventricular systolic function was not well visualized. Tricuspid regurgitation signal is  inadequate for assessing PA pressure. Left Atrium: Left atrial size was normal in size. Right Atrium: Right atrial size was not well visualized. Pericardium: Trivial pericardial effusion is present. Mitral Valve: The mitral valve is normal in structure. No evidence of mitral valve regurgitation. Tricuspid Valve: The tricuspid valve is normal in structure. Tricuspid valve regurgitation is trivial. Aortic Valve: The aortic valve is tricuspid. Aortic valve regurgitation is not visualized. No aortic stenosis is present. Pulmonic Valve: The pulmonic valve was not well visualized. Pulmonic valve regurgitation is not visualized. Aorta: The aortic root and ascending aorta are structurally normal, with no evidence of dilitation. IAS/Shunts: The interatrial septum was not well visualized.  LEFT VENTRICLE PLAX 2D LVIDd:         4.20 cm  Diastology LVIDs:         2.80 cm  LV e' medial:    6.53 cm/s LV PW:         1.50 cm  LV E/e' medial:  9.6 LV IVS:        1.40 cm  LV e' lateral:   4.68 cm/s LVOT diam:     2.30 cm  LV E/e' lateral: 13.5 LV SV:         59 LV SV Index:   26 LVOT Area:     4.15 cm  RIGHT VENTRICLE RV S prime:     5.98 cm/s LEFT ATRIUM             Index       RIGHT ATRIUM           Index LA diam:        4.80 cm 2.13 cm/m  RA Area:     22.30 cm LA Vol (A2C):   45.7 ml 20.24 ml/m RA Volume:   69.40 ml  30.74 ml/m LA Vol (A4C):   67.4 ml 29.85 ml/m LA Biplane Vol: 58.5 ml 25.91 ml/m  AORTIC VALVE LVOT Vmax:   80.30 cm/s LVOT Vmean:  48.400 cm/s LVOT VTI:    0.142 m  AORTA Ao Root diam: 3.00 cm MITRAL VALVE MV Area (PHT): 2.48 cm    SHUNTS MV Decel Time: 306 msec    Systemic VTI:  0.14 m MV E velocity: 63.00 cm/s  Systemic Diam: 2.30 cm MV A velocity: 65.10 cm/s MV E/A ratio:  0.97 Epifanio Lesches MD Electronically signed by Epifanio Lesches MD Signature Date/Time: 10/13/2020/4:53:27 PM    Final     CT ANGIO HEAD CODE STROKE   Result Date: 10/13/2020 CLINICAL DATA:  Acute infarct on MRI EXAM: CT  ANGIOGRAPHY HEAD AND NECK TECHNIQUE: Multidetector CT imaging of the head and neck was performed using the standard protocol during bolus administration of intravenous contrast. Multiplanar CT image reconstructions and MIPs were obtained to evaluate the vascular anatomy. Carotid stenosis measurements (when applicable) are obtained utilizing NASCET criteria, using the distal internal carotid diameter as the denominator. CONTRAST:  75mL OMNIPAQUE IOHEXOL 350 MG/ML SOLN COMPARISON:  10/12/2020 FINDINGS: CT HEAD Brain: There is no acute intracranial hemorrhage or mass effect. There is subtle low-attenuation in the right corona radiata corresponding to infarction on MRI. Chronic infarct of the left basal ganglia and adjacent white matter. Additional patchy and confluent areas of hypoattenuation in the supratentorial white likely reflect age advanced chronic microvascular ischemic changes. Ventricles are stable size. There is no extra-axial fluid collection. Vascular: No new findings. Skull: Calvarium is unremarkable. Sinuses/Orbits: No acute finding. Other: None. Review of the MIP images confirms the above findings CTA NECK Aortic arch: Great vessel origins are patent. Right carotid system: Patent. No measurable stenosis at the ICA origin. Left carotid system: Patent. No measurable stenosis at the ICA origin. Vertebral arteries: Patent and codominant. Skeleton: Mild cervical spine degenerative changes. Other neck: No significant abnormality. Upper chest: No apical lung mass. Review of the MIP images confirms the above findings CTA HEAD Anterior circulation: Intracranial internal carotid arteries patent with atherosclerotic irregularity primarily along the cavernous segments, right greater left. Irregular noncalcified plaque is present the floor of the posterior genu. There is a 3 x 2 mm inferiorly directed aneurysm the distal supraclinoid right ICA. Anterior cerebral arteries are patent. Hypoplastic right A1 ACA. Middle  cerebral arteries are patent. There is mild atherosclerotic irregularity of distal branches. Posterior circulation: Intracranial vertebral arteries, basilar artery, and posterior cerebral arteries are patent. Venous sinuses: Patent as allowed by contrast bolus timing. Review of the MIP images confirms the above findings IMPRESSION: No acute intracranial hemorrhage. Evolving acute infarction of the corona radiata better seen on MRI. No hemodynamically significant stenosis in the neck. Right greater than left intracranial internal carotid atherosclerosis. Irregular noncalcified plaque the floor of the posterior genu. Mild atherosclerotic irregularity of distal MCA branches bilaterally. 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 Electronically Signed   By: Guadlupe Spanish M.D.   On: 10/13/2020 14:05             Medical Problem List and Plan: 1.  Left hemiparesis secondary to right Posterior limb internal capsule/Corona radiate as well as history of CVA 2018             -patient may shower             -ELOS/Goals: 12-17 days. mod I to supervision with PT, OT, SLP             -  WHO LUE 2.  Antithrombotics: -DVT/anticoagulation: Lovenox             -antiplatelet therapy: Aspirin 81 mg daily and Plavix 75 mg x3 weeks then aspirin alone  3. Pain Management: Tylenol as needed 4. Mood: Provide emotional support             -antipsychotic agents: N/A 5. Neuropsych: This patient is capable of making decisions on her own behalf. 6. Skin/Wound Care: Routine skin checks 7. Fluids/Electrolytes/Nutrition: Routine in and outs with follow-up chemistries 8.  Hypertension.  Lisinopril 20 mg daily.  Monitor with increased mobility 9.  Hyperlipidemia.  Lipitor 10.  Prediabetes.  Hemoglobin A1c 5.6.  CBGs discontinued       Charlton Amor, PA-C 10/14/2020   I have personally performed a face to face diagnostic evaluation of this patient and formulated the key components of the plan.  Additionally,  I have personally reviewed laboratory data, imaging studies, as well as relevant notes and concur with the physician assistant's documentation above.  The patient's status has not changed from the original H&P.  Any changes in documentation from the acute care chart have been noted above.  Ranelle Oyster, MD, [place]

## 2020-10-19 LAB — CBC WITH DIFFERENTIAL/PLATELET
Abs Immature Granulocytes: 0.01 10*3/uL (ref 0.00–0.07)
Basophils Absolute: 0 10*3/uL (ref 0.0–0.1)
Basophils Relative: 1 %
Eosinophils Absolute: 0.2 10*3/uL (ref 0.0–0.5)
Eosinophils Relative: 4 %
HCT: 39.4 % (ref 36.0–46.0)
Hemoglobin: 13.3 g/dL (ref 12.0–15.0)
Immature Granulocytes: 0 %
Lymphocytes Relative: 24 %
Lymphs Abs: 1.1 10*3/uL (ref 0.7–4.0)
MCH: 27.4 pg (ref 26.0–34.0)
MCHC: 33.8 g/dL (ref 30.0–36.0)
MCV: 81.1 fL (ref 80.0–100.0)
Monocytes Absolute: 0.4 10*3/uL (ref 0.1–1.0)
Monocytes Relative: 9 %
Neutro Abs: 2.7 10*3/uL (ref 1.7–7.7)
Neutrophils Relative %: 62 %
Platelets: 141 10*3/uL — ABNORMAL LOW (ref 150–400)
RBC: 4.86 MIL/uL (ref 3.87–5.11)
RDW: 13.1 % (ref 11.5–15.5)
WBC: 4.4 10*3/uL (ref 4.0–10.5)
nRBC: 0 % (ref 0.0–0.2)

## 2020-10-19 LAB — COMPREHENSIVE METABOLIC PANEL
ALT: 18 U/L (ref 0–44)
AST: 22 U/L (ref 15–41)
Albumin: 3.2 g/dL — ABNORMAL LOW (ref 3.5–5.0)
Alkaline Phosphatase: 59 U/L (ref 38–126)
Anion gap: 9 (ref 5–15)
BUN: 9 mg/dL (ref 6–20)
CO2: 26 mmol/L (ref 22–32)
Calcium: 10.1 mg/dL (ref 8.9–10.3)
Chloride: 104 mmol/L (ref 98–111)
Creatinine, Ser: 0.83 mg/dL (ref 0.44–1.00)
GFR, Estimated: >60 mL/min (ref >60)
Glucose, Bld: 100 mg/dL — ABNORMAL HIGH (ref 70–99)
Potassium: 3.4 mmol/L — ABNORMAL LOW (ref 3.5–5.1)
Sodium: 139 mmol/L (ref 135–145)
Total Bilirubin: 0.6 mg/dL (ref 0.3–1.2)
Total Protein: 6.1 g/dL — ABNORMAL LOW (ref 6.5–8.1)

## 2020-10-19 MED ORDER — HYDRALAZINE HCL 10 MG PO TABS
10.0000 mg | ORAL_TABLET | Freq: Three times a day (TID) | ORAL | Status: DC
  Administered 2020-10-19: 10 mg via ORAL
  Filled 2020-10-19: qty 1

## 2020-10-19 MED ORDER — ENOXAPARIN SODIUM 80 MG/0.8ML ~~LOC~~ SOLN
65.0000 mg | SUBCUTANEOUS | Status: DC
  Administered 2020-10-19 – 2020-10-20 (×2): 65 mg via SUBCUTANEOUS
  Filled 2020-10-19 (×2): qty 0.8

## 2020-10-19 MED ORDER — POTASSIUM CHLORIDE 20 MEQ PO PACK
40.0000 meq | PACK | Freq: Once | ORAL | Status: AC
  Administered 2020-10-19: 40 meq via ORAL
  Filled 2020-10-19 (×2): qty 2

## 2020-10-19 NOTE — Patient Care Conference (Signed)
Inpatient RehabilitationTeam Conference and Plan of Care Update Date: 10/19/2020   Time: 09:37 AM    Patient Name: Shannon Garza      Medical Record Number: [id number]  Date of Birth: 10/19/1965 Sex: Female         Room/Bed: 5C08C/5C08C-01 Payor Info: Payor: BLUE CROSS BLUE SHIELD / Plan: BCBS COMM PPO / Product Type: *No Product type* /    Admit Date/Time:  10/18/2020  2:27 PM  Primary Diagnosis:  Subcortical infarction [facility])  Hospital Problems: Principal Problem:   Subcortical infarction [facility]) Active Problems:   CVA (cerebral vascular accident) [employer])    Expected Discharge Date: Expected Discharge Date:  (12-14 days)  Team Members Present: Physician leading conference: Dr. Sula Soda Care Coodinator Present: Dossie Der, LCSW;Lanyia Jewel Marlyne Beards, RN, BSN, CRRN Nurse Present: Chana Bode, RN PT Present: Raechel Chute, PT OT Present: Rosalio Loud, OT SLP Present: Colin Benton, SLP PPS Coordinator present : Fae Pippin, SLP     Current Status/Progress Goal Weekly Team Focus  Bowel/Bladder   Pt overall continent of B/B with episodes of incontinence. LBM 10/17/20  Establish continence of B/B  Offer toileting q2hr or at each rounding   Swallow/Nutrition/ Hydration             ADL's   Evals pending         Mobility   bed mobility CGA/supervision, transfers without AD min A, gait 53ft without AD min A, car transfer without AD min A  supervision  functional mobility/transfers, generalized strengthening, dynamic standing balance/coordination, ambulation, stair navigation, endurance.   Communication   Eval pending         Safety/Cognition/ Behavioral Observations            Pain   Denies pain  Maintain pain free regiment      Skin   Eccymosis on arm, otherwise skin is fine  Remain free of breakdown and skin intact  Skin care     Discharge Planning:  Home with sister and brother, sister can be there with her to assist. Pt hopeful she will do well here and not need  much assist at discharge.   Team Discussion: Heart rate decreased, added Hydralazine. Albumin decreased, advised to increase protein intake. Continent B/B. Patient is vegan and struggling with the food offered in the hospital. Skin is CDI. Lives with brother and sister. Patient on target to meet rehab goals: Min assist without the RW, car transfers are min assist, LLE weaker than the LUE. Goals are at supervision level. PT eval pending. SLP eval pending.  *See Care Plan and progress notes for long and short-term goals.   Revisions to Treatment Plan:  Hydralazine added for heart rate.  Teaching Needs: Family education, medication management, skin/wound care, transfer training, gait training, endurance training, dietary management to include protein in a vegan diet.  Current Barriers to Discharge: Inaccessible home environment, Decreased caregiver support, Home enviroment access/layout, Lack of/limited family support, Medication compliance, Behavior and Nutritional means  Possible Resolutions to Barriers: Continue current medications, offer protein options for vegan diet, provide emotional support for patient and family.     Medical Summary Current Status: hypokalemia, elevated BP, hypoalbuminemia, left sided weakness  Barriers to Discharge: Medical stability  Barriers to Discharge Comments: hypokalemia, elevated BP, hypoalbuminemia, left sided weakness Possible Resolutions to [employer] Focus: Supplement potassium and repeat tomorrow, provided education regarding high potassium foods, start hydralazine 10mg  TID, encouraged high protein diet and gave advice regaring foods given vegan status, educated regarding prognosis for recovery  Continued Need for Acute Rehabilitation Level of Care: The patient requires daily medical management by a physician with specialized training in physical medicine and rehabilitation for the following reasons: Direction of a multidisciplinary physical  rehabilitation program to maximize functional independence : Yes Medical management of patient stability for increased activity during participation in an intensive rehabilitation regime.: Yes Analysis of laboratory values and/or radiology reports with any subsequent need for medication adjustment and/or medical intervention. : Yes   I attest that I was present, lead the team conference, and concur with the assessment and plan of the team.   Tennis Must 10/19/2020, 1:53 PM

## 2020-10-19 NOTE — Progress Notes (Signed)
Inpatient Rehabilitation Center Individual Statement of Services  Patient Name:  Christinea Brizuela  Date:  10/19/2020  Welcome to the Inpatient Rehabilitation Center.  Our goal is to provide you with an individualized program based on your diagnosis and situation, designed to meet your specific needs.  With this comprehensive rehabilitation program, you will be expected to participate in at least 3 hours of rehabilitation therapies Monday-Friday, with modified therapy programming on the weekends.  Your rehabilitation program will include the following services:  Physical Therapy (PT), Occupational Therapy (OT), Speech Therapy (ST), 24 hour per day rehabilitation nursing, Therapeutic Recreaction (TR), Neuropsychology, Care Coordinator, Rehabilitation Medicine, Nutrition Services and Pharmacy Services  Weekly team conferences will be held on Tuesday to discuss your progress.  Your Inpatient Rehabilitation Care Coordinator will talk with you frequently to get your input and to update you on team discussions.  Team conferences with you and your family in attendance may also be held.  Expected length of stay: 12-14 days  Overall anticipated outcome: supervision with cueing  Depending on your progress and recovery, your program may change. Your Inpatient Rehabilitation Care Coordinator will coordinate services and will keep you informed of any changes. Your Inpatient Rehabilitation Care Coordinator's name and contact numbers are listed  below.  The following services may also be recommended but are not provided by the Inpatient Rehabilitation Center:   Driving Evaluations  Home Health Rehabiltiation Services  Outpatient Rehabilitation Services  Vocational Rehabilitation   Arrangements will be made to provide these services after discharge if needed.  Arrangements include referral to agencies that provide these services.  Your insurance has been verified to be:  [employer] [place] Your primary doctor  is:  None  Pertinent information will be shared with your doctor and your insurance company.  Inpatient Rehabilitation Care Coordinator:  Dossie Der, [place] [phone] or Luna Glasgow  Information discussed with and copy given to patient by: Lucy Chris, 10/19/2020, 9:47 AM

## 2020-10-19 NOTE — Progress Notes (Signed)
Patient information reviewed and entered into eRehab System by Becky Windsor, PPS coordinator. Information including medical coding, function ability, and quality indicators will be reviewed and updated through discharge.   

## 2020-10-19 NOTE — Progress Notes (Signed)
Inpatient Rehabilitation Care Coordinator Assessment and Plan Patient Details  Name: Shannon Garza MRN: [id number] Date of Birth: [birth year]  Today's Date: 10/19/2020  Hospital Problems: Principal Problem:   Subcortical infarction [facility]) Active Problems:   CVA (cerebral vascular accident) [facility])  Past Medical History:  Past Medical History:  Diagnosis Date  . Diabetes mellitus without complication (HCC)   . Hypertension    Past Surgical History: History reviewed. No pertinent surgical history. Social History:  reports that she has never smoked. She has never used smokeless tobacco. She reports previous alcohol use. She reports previous drug use.  Family / Support Systems Marital Status: Single Patient Roles: Other (Comment) (employee and siblings) Other Supports: Vena [place] [phone]  Michael-brother Anticipated Caregiver: lavina Ability/Limitations of Caregiver: supervision-min assist Caregiver Availability: 24/7 Family Dynamics: Close knit family who will pull together to assist sister. Pt moved recently from [place] 11/21 to be closer to family. She has numrerous nieces and nephews no children of her own  Social History Preferred language: English Religion: Christian Cultural Background: No issues Education: HS Read: Yes Write: Yes Employment Status: Employed Name of Employer: [employer] Return to Work Plans: Hopeful to return to work when able and recovered from stroke Marine scientist Issues: No issues Guardian/Conservator: None-according to MD pt is capable of making her own decisions while here   Abuse/Neglect Abuse/Neglect Assessment Can Be Completed: Yes Physical Abuse: Denies Verbal Abuse: Denies Sexual Abuse: Denies Exploitation of patient/patient's resources: Denies Self-Neglect: Denies  Emotional Status Pt's affect, behavior and adjustment status: Pt is motivated to do well and had recovered from her past CVA 2018 and hopeful she  will again. She realizes now she needs to control her BP and wants to prevent another CVA. She is grateful to have her family to assist her. Recent Psychosocial Issues: other health issues Psychiatric History: No issues-deferred depression screen due to coping appropriately. Due to young age would benefit from seeing neuro-psych while here for coping Substance Abuse History: No issues  Patient / Family Perceptions, Expectations & Goals Pt/Family understanding of illness & functional limitations: Pt and sister can explain her stroke and deficits. She talks with the MD and feels she has a good understanding of her treatment plan moving forward. Premorbid pt/family roles/activities: sibling, employee, friend, aunt, etc Anticipated changes in roles/activities/participation: resume Pt/family expectations/goals: Pt states: " I hope to do well and not need much assist when I leave here."  Sister states: " We will help her she would [place], just tell me what I need to do."  [employer]: None Premorbid Home Care/DME Agencies: None Transportation available at discharge: Self and siblings Resource referrals recommended: Neuropsychology  Discharge Planning Living Arrangements: Other relatives Support Systems: Other relatives,Friends/neighbors Type of Residence: Private residence Community education officer Resources: Media planner (specify) ([employer] of [place]) Surveyor, quantity Resources: Science writer Screen Referred: No Living Expenses: Psychologist, sport and exercise Management: Patient,Family Does the patient have any problems obtaining your medications?: No (has no PCP needs more by DC) Home Management: All three siblings Patient/Family Preliminary Plans: Return home with sister and brother, along with sister in-law. Sister can provide 24/7 care if needed. Discussed with pt need for PCP and importance to manage her BP's to prevent another CVA. She is on board now with this. Care Coordinator  Anticipated Follow Up Needs: HH/OP  Clinical Impression Pleasant female who is motivated to do well and recover from this stroke. She realizes the importance of managing her BP in preventing another CVA since this is her second  one. Will ask neuro-psych to see and work on discharge needs. Pt does have 24/7 care if needed from sister she lives with. Will have FMLA papers faxed to this worker to get completed.  Lucy Chris 10/19/2020, 9:44 AM

## 2020-10-19 NOTE — Progress Notes (Signed)
PROGRESS NOTE   Subjective/Complaints: BP elevated to 160/70s last night and this morning with associated face numbness, which resolved.  K+ 3.4 She has no complaints this morning. Asks about her weakness, her FMLA paperwork  ROS: + left sided weakness.  Objective:   No results found. Recent Labs    10/18/20 0330 10/19/20 0430  WBC 4.8 4.4  HGB 13.6 13.3  HCT 43.5 39.4  PLT 153 141*   Recent Labs    10/18/20 0330 10/19/20 0430  NA 140 139  K 3.7 3.4*  CL 107 104  CO2 25 26  GLUCOSE 101* 100*  BUN 11 9  CREATININE 0.80 0.83  CALCIUM 10.0 10.1    Intake/Output Summary (Last 24 hours) at 10/19/2020 0844 Last data filed at 10/19/2020 0740 Gross per 24 hour  Intake 120 ml  Output 575 ml  Net -455 ml        Physical Exam: Vital Signs Blood pressure (!) 162/71, pulse (!) 50, temperature 98.5 F (36.9 C), temperature source Oral, resp. rate 18, height 5\' 3"  (1.6 m), SpO2 99 %. Gen: no distress, normal appearing HEENT: oral mucosa pink and moist, NCAT Cardio: Bradycardic Chest: normal effort, normal rate of breathing Abd: soft, non-distended Ext: no edema Psych: pleasant, normal affect Skin: intact Neurological:  Mental Status: She is alert.  Comments: Patient is alert in no acute distress. Speech is mildly dysarthric but intelligible. Oriented x3 and follows commands. Fair awareness of deficits. Left central 7. Motor: LUE 2+ deltoid, biceps, triceps, 3/5 wrist and 1/5 hand grip, LLE 4/5 prox to distal. Senses pain and LT in all 4's. No resting tone .DTR's 1+.  Psychiatric:  Mood and Affect: Moodnormal.  Behavior: Behaviornormal.    Assessment/Plan: 1. Functional deficits which require 3+ hours per day of interdisciplinary therapy in a comprehensive inpatient rehab setting.  Physiatrist is providing close team supervision and 24 hour management of active medical problems listed  below.  Physiatrist and rehab team continue to assess barriers to discharge/monitor patient progress toward functional and medical goals  Care Tool:  Bathing              Bathing assist       Upper Body Dressing/Undressing Upper body dressing        Upper body assist      Lower Body Dressing/Undressing Lower body dressing      What is the patient wearing?: Incontinence brief     Lower body assist       Toileting Toileting    Toileting assist Assist for toileting: Maximal Assistance - Patient 25 - 49%     Transfers Chair/bed transfer  Transfers assist     Chair/bed transfer assist level: Maximal Assistance - Patient 25 - 49%     Locomotion Ambulation   Ambulation assist              Walk 10 feet activity   Assist           Walk 50 feet activity   Assist           Walk 150 feet activity   Assist  Walk 10 feet on uneven surface  activity   Assist           Wheelchair     Assist               Wheelchair 50 feet with 2 turns activity    Assist            Wheelchair 150 feet activity     Assist          Blood pressure (!) 162/71, pulse (!) 50, temperature 98.5 F (36.9 C), temperature source Oral, resp. rate 18, height 5\' 3"  (1.6 m), SpO2 99 %.  Medical Problem List and Plan: 1.Left hemiparesissecondary to rightPosterior limb internal capsule/Corona radiate as well as history of CVA 2018 -patient may shower -ELOS/Goals: 10-12 days. mod I to supervision with PT, OT, SLP -WHO LUE  -Initial CIR evaluations today 2. Antithrombotics: -DVT/anticoagulation:Continue Lovenox- currently ambulating MinA 6 feet -antiplatelet therapy: Aspirin 81 mg daily and Plavix75 mgx3 weeks then aspirin alone  3. Pain Management:Tylenol as needed 4. Mood:Provide emotional support -antipsychotic agents: N/A 5. Neuropsych: This  patientiscapable of making decisions on herown behalf. 6. Skin/Wound Care:Routine skin checks. May d/c IV 7. Fluids/Electrolytes/Nutrition:Routine in and outs with follow-up chemistries 8. Hypertension. Lisinopril 20 mg daily. Monitor with increased mobility. Hydralazine 10mg  TID added to lower BP while minimizing lowering of HR given bradycardia 9. Hyperlipidemia. Continue Lipitor 10. Prediabetes. Hemoglobin A1c 5.6. CBGs discontinued 11. Bradycardic to 50s: monitor HR TID 12. Morbid obesity: providing regular dietary education 13. Hypokalemia: supplement and repeat K+ 2/23 14. Hypoalbuminemia: advised high protein foods- nuts are a great source given vegan status. 15. Disposition: She is from . Will assist with FMLA paperwork  35 minutes spent in reviewing H&P, medications, examination of patient and discussion of prognosis, review of labs and discussion with patient, discussion with therapy/RN/SW regarding status and prognosis, supplementing potassium and repeating tomorrow, encouraging high protein foods given hypoalbuminemia, adding hydralazine for hypertension, monitoring HR given bradycardia, placing nursing order for d/c IV- >50% spent in direct care and coordination of care for patient    LOS: 1 days A FACE TO FACE EVALUATION WAS PERFORMED  3/23 Shannon Garza 10/19/2020, 8:44 AM

## 2020-10-19 NOTE — Progress Notes (Signed)
   10/19/20 1545  What Happened  Was fall witnessed? No  Patients activity before fall bathroom-assisted  Point of contact hip/leg;arm/shoulder  Was patient injured? No  Patient found in bathroom;on floor  Found by Staff-comment Gavin Pound RN)  Follow Up  MD notified DSan Angiulli PAC  Time MD notified 1600  Adult Fall Risk Assessment  Risk Factor Category (scoring not indicated) Fall has occurred during this admission (document High fall risk)  Age 55  Fall History: Fall within 6 months prior to admission 5  Elimination; Bowel and/or Urine Incontinence 0  Elimination; Bowel and/or Urine Urgency/Frequency 0  Medications: includes PCA/Opiates, Anti-convulsants, Anti-hypertensives, Diuretics, Hypnotics, Laxatives, Sedatives, and Psychotropics 5  Patient Care Equipment 0  Mobility-Assistance 2  Mobility-Gait 2  Mobility-Sensory Deficit 0  Altered awareness of immediate physical environment 0  Impulsiveness 0  Lack of understanding of one's physical/cognitive limitations 0  Total Score 14  Patient Fall Risk Level High fall risk  Adult Fall Risk Interventions  Required Bundle Interventions *See Row Information* High fall risk - low, moderate, and high requirements implemented  Additional Interventions Use of appropriate toileting equipment (bedpan, BSC, etc.);Room near nurses station  Screening for Fall Injury Risk (To be completed on HIGH fall risk patients) - Assessing Need for Floor Mats  Risk For Fall Injury- Criteria for Floor Mats None identified - No additional interventions needed

## 2020-10-19 NOTE — Evaluation (Signed)
Speech Language Pathology Assessment and Plan  Patient Details  Name: Shannon Garza MRN: [id number] Date of Birth: 1965/10/22  SLP Diagnosis: Cognitive Impairments  Rehab Potential: Good ELOS: 12-14 days    Today's Date: 10/19/2020 SLP Individual Time: 1432-1530 SLP Individual Time Calculation (min): 64 min   Hospital Problem: Principal Problem:   Subcortical infarction [facility]) Active Problems:   CVA (cerebral vascular accident) [facility])  Past Medical History:  Past Medical History:  Diagnosis Date  . Diabetes mellitus without complication ([place])   . Hypertension    Past Surgical History: History reviewed. No pertinent surgical history.  Assessment / Plan / Recommendation Clinical Impression   Patient is a 55 y.o. right-handed female with history ofprediabetes, CVA 2018 and hypertension. Patient opted for herbal medicine route rather than taking other medications after CVA. Per chart review patient lives with her brother sister-in-law and sister. Independent prior to admission works from home. Multilevel home bed and bath upstairs 2 steps to entry. Sister can provide assistance. Presented 10/12/2020 with left-sided weakness 10/11/2020. Noted blood pressure systolic 194-174. CT/MRI showed area of restricted diffusion involving the posterior limb of internal capsule on the right extending into the corona radiata consistent with acute/subacute infarction. Remote infarct in left basal ganglia and left side of the pons. CT angiogram of the head and neck no hemodynamically significant stenosis in the neck. 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 Echocardiogram with ejection fraction of 60 to 65% no wall motion abnormalities. Admission chemistries urine drug screen negative potassium 3.2 glucose 126 alcohol negative hemoglobin A1c 5.6. Currently maintained on aspirin and Plavix for CVA prophylaxis x3 weeks and aspirin alone. Subcutaneous Lovenox for DVT prophylaxis.  Tolerating a regular diet. Therapy evaluations completed with recommendations of physical medicine rehab consult to the left side weakness and patient was admitted for a comprehensive rehab program. Patient transferred to CIR on 10/18/2020 .   Pt presents with mild cognitive impairments, deficits include executive function/complex problem solving, emergent/anticipatory awareness and memory.  Pt scored WFL on subsections on the formal cognitive linguistic assessment, Cognistat, however was unable to complete 3rd construction task. Due to time restraints SLP administered portions of the CLQT, noting mild-moderate deficits in executive function skills, scoring below normal limits in the symbol trial and design generation tasks. Pt demonstrated ability to monitor most errors in executive function tasks, however limited ability to self-correct. SLP will continue assessment during next treatment session. Pt supports acute deficits of delayed processing, but expressed baseline deficits from previous CVA in memory as well as what appeared to be higher level problem solving. Family was not present to confirm baseline and SLP will attempt to contact in next session. Pt expressed noncompliance with consuming prescribed medication and would benefit from continued education. Pt presents with mild impairments in speech in word finding during conversation and x1 mispronunciation, however pt attributed to previous L CVA. Pt expressed no difficulty swallowing. Pt would benefit from skilled ST services in order to maximize functional independence and reduce burden of care, likely requiring supervision at discharge with continued skilled ST services.   Skilled Therapeutic Interventions          Skilled ST services focused on cognitive skills. SLP facilitated administration of cognitive linguistic formal assessment and provided education of results. SLP and pt collaborated to set goals for cognitive linguistic needs during length  of stay. All questions answered to satisfaction.  Pt was left in room with call bell within reach and chair alarm set. SLP  recommends to continue skilled services.  SLP Assessment  Patient will need skilled Speech Lanaguage Pathology Services during CIR admission    Recommendations  SLP Diet Recommendations: Age appropriate regular solids;Thin Liquid Administration via: Cup;Straw Medication Administration: Whole meds with liquid Supervision: Patient able to self feed Postural Changes and/or Swallow Maneuvers: Seated upright 90 degrees Oral Care Recommendations: Oral care BID Patient destination: Home Follow up Recommendations: Home Health SLP;Outpatient SLP;24 hour supervision/assistance Equipment Recommended: None recommended by SLP    SLP Frequency 1 to 3 out of 7 days   SLP Duration  SLP Intensity  SLP Treatment/Interventions 12-14 days  Minumum of 1-2 x/day, 30 to 90 minutes  Cognitive remediation/compensation;Cueing hierarchy;Medication managment;Functional tasks;Patient/family education    Pain Pain Assessment Pain Score: 0-No pain  Prior Functioning Cognitive/Linguistic Baseline: Baseline deficits Baseline deficit details: memory and possible higher level problem solving Type of Home: House  Lives With: Family Available Help at Discharge: Family;Available 24 hours/day Vocation: Full time employment  SLP Evaluation Cognition Overall Cognitive Status: Impaired/Different from baseline Arousal/Alertness: Awake/alert Orientation Level: Oriented X4 Attention: Selective Selective Attention: Appears intact Memory: Impaired Memory Impairment: Retrieval deficit;Storage deficit;Decreased recall of new information Awareness: Impaired Awareness Impairment: Emergent impairment;Anticipatory impairment Problem Solving: Impaired Problem Solving Impairment: Functional complex Safety/Judgment: Appears intact (except noncompliance with medication)  Comprehension Auditory  Comprehension Overall Auditory Comprehension: Appears within functional limits for tasks assessed Yes/No Questions: Within Functional Limits Commands: Within Functional Limits Conversation: Complex Expression Expression Primary Mode of Expression: Verbal Verbal Expression Overall Verbal Expression: Appears within functional limits for tasks assessed Initiation: No impairment Level of Generative/Spontaneous Verbalization: Conversation Repetition: No impairment Naming: No impairment Pragmatics: No impairment Oral Motor Oral Motor/Sensory Function Overall Oral Motor/Sensory Function: Within functional limits Motor Speech Overall Motor Speech: Appears within functional limits for tasks assessed Respiration: Within functional limits Phonation: Normal Resonance: Within functional limits Articulation: Within functional limitis Intelligibility: Intelligible Motor Planning: Witnin functional limits Motor Speech Errors: Not applicable  Care Tool Care Tool Cognition Expression of Ideas and Wants Expression of Ideas and Wants: Without difficulty (complex and basic) - expresses complex messages without difficulty and with speech that is clear and easy to understand   Understanding Verbal and Non-Verbal Content Understanding Verbal and Non-Verbal Content: Usually understands - understands most conversations, but misses some part/intent of message. Requires cues at times to understand   Memory/Recall Ability *first 3 days only Memory/Recall Ability *first 3 days only: That he or she is in a hospital/hospital unit;Current season;Staff names and faces;Location of own room       Short Term Goals: Week 1: SLP Short Term Goal 1 (Week 1): Pt will complete complex problem solving tasks with min A verbal cues. SLP Short Term Goal 2 (Week 1): Pt will self-monitor and self-correct errors in problem solving tasks with supervision A verbal cues. SLP Short Term Goal 3 (Week 1): Pt will demonstrate  anticipatory awareness skills listing 3 activities inwhich she will need asistance with at discharge. SLP Short Term Goal 4 (Week 1): Pt will demonstrated short term recall within tasks with supervision A verbal cues.  Refer to Care Plan for Long Term Goals  Recommendations for other services: None   Discharge Criteria: Patient will be discharged from SLP if patient refuses treatment 3 consecutive times without medical reason, if treatment goals not met, if there is a change in medical status, if patient makes no progress towards goals or if patient is discharged from hospital.  The above assessment, treatment plan, treatment alternatives and goals  were discussed and mutually agreed upon: by patient  Lenya Sterne  [facility] 10/19/2020, 4:58 PM

## 2020-10-19 NOTE — Evaluation (Signed)
Occupational Therapy Assessment and Plan  Shannon Garza Details  Name: Shannon Garza MRN: [id number] Date of Birth: 06-18-1966  OT Diagnosis: abnormal posture, hemiplegia affecting non-dominant side and muscle weakness (generalized) Rehab Potential: Rehab Potential (ACUTE ONLY): Good ELOS: 14-16 days   Today's Date: 10/19/2020 OT Individual Time: 1030-1130 OT Individual Time Calculation (min): 60 min     Hospital Problem: Principal Problem:   Subcortical infarction [facility]) Active Problems:   CVA (cerebral vascular accident) [facility])   Past Medical History:  Past Medical History:  Diagnosis Date  . Diabetes mellitus without complication ([place])   . Hypertension    Past Surgical History: History reviewed. No pertinent surgical history.  Assessment & Plan Clinical Impression: Shannon Garza is a 55 y.o. right-handed female with history ofprediabetes, CVA 2018 and hypertension. Shannon Garza opted for herbal medicine route rather than taking other medications after CVA. Per chart review Shannon Garza lives with her brother sister-in-law and sister. Independent prior to admission works from home. Multilevel home bed and bath upstairs 2 steps to entry. Sister can provide assistance. Presented 10/12/2020 with left-sided weakness 10/11/2020. Noted blood pressure systolic 213-086. CT/MRI showed area of restricted diffusion involving the posterior limb of internal capsule on the right extending into the corona radiata consistent with acute/subacute infarction. Remote infarct in left basal ganglia and left side of the pons. CT angiogram of the head and neck no hemodynamically significant stenosis in the neck. 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 Echocardiogram with ejection fraction of 60 to 65% no wall motion abnormalities. Admission chemistries urine drug screen negative potassium 3.2 glucose 126 alcohol negative hemoglobin A1c 5.6. Currently maintained on aspirin and Plavix for CVA prophylaxis  x3 weeks and aspirin alone. Subcutaneous Lovenox for DVT prophylaxis. Tolerating a regular diet. Therapy evaluations completed with recommendations of physical medicine rehab consult to the left side weakness and Shannon Garza was admitted for a comprehensive rehab program. Shannon Garza transferred to CIR on 10/18/2020 .    Shannon Garza currently requires mod with basic self-care skills secondary to muscle weakness, decreased cardiorespiratoy endurance, impaired timing and sequencing, unbalanced muscle activation and decreased coordination, decreased motor planning and decreased standing balance, hemiplegia and decreased balance strategies.  Prior to hospitalization, Shannon Garza could complete ADLs with independent .  Shannon Garza will benefit from skilled intervention to decrease level of assist with basic self-care skills and increase independence with basic self-care skills prior to discharge home with care partner.  Anticipate Shannon Garza will require 24 hour supervision and follow up outpatient.  OT - End of Session Endurance Deficit: Yes Endurance Deficit Description: pt reported mild fatigue   OT Evaluation Precautions/Restrictions  Precautions Precautions: Fall Precaution Comments: L hemi Restrictions Weight Bearing Restrictions: No General   Vital Signs Therapy Vitals Temp: 98.6 F (37 C) Temp Source: Oral Pulse Rate: (!) 59 Resp: 18 BP: (!) 170/88 Shannon Garza Position (if appropriate): Lying Oxygen Therapy SpO2: 100 % O2 Device: Room Air Pain Pain Assessment Pain Scale: 0-10 Pain Score: 0-No pain Home Living/Prior Functioning Home Living Family/Shannon Garza expects to be discharged to:: Private residence Living Arrangements: Other relatives Available Help at Discharge: Family,Available 24 hours/day Type of Home: House Home Access: Stairs to enter [employer] of Steps: 2 Entrance Stairs-Rails: Right,Left,Can reach both Home Layout: Multi-level,Bed/bath upstairs,1/2 bath on main  level Alternate Level Stairs-Number of Steps: full flight Alternate Level Stairs-Rails: Left Bathroom Shower/Tub: Walk-in shower,Tub/shower unit Bathroom Toilet: Handicapped height Bathroom Accessibility: Yes Additional Comments: could have access to her brother's walk in shower on 2nd floor  Lives  With: Family IADL History Homemaking Responsibilities: No Prior Function Level of Independence: Independent with basic ADLs,Independent with transfers,Independent with homemaking with ambulation,Independent with gait  Able to Take Stairs?: Yes Driving: Yes Vocation: Full time employment (working 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 Vision Baseline Vision/History: Wears glasses Wears Glasses: Reading only Shannon Garza Visual Report: No change from baseline Vision Assessment?: No apparent visual deficits Perception  Perception: Impaired Inattention/Neglect: Impaired-to be further tested in functional context Praxis Praxis: Not tested Cognition Overall Cognitive Status: Within Functional Limits for tasks assessed Arousal/Alertness: Awake/alert Orientation Level: Person;Place;Situation Person: Oriented Place: Oriented Situation: Oriented Year: 2022 Month: February Day of Week: Correct Memory: Appears intact Immediate Memory Recall: Sock;Blue;Bed Memory Recall Sock: Without Cue Memory Recall Blue: Without Cue Memory Recall Bed: Without Cue Attention: Selective Selective Attention: Appears intact Awareness: Impaired Awareness Impairment: Emergent impairment Problem Solving: Appears intact Safety/Judgment: Appears intact Sensation Sensation Light Touch: Appears Intact Proprioception: Appears Intact Additional Comments: pt reports tingling in L 4th and 5th metatarsal and along plantar surface of L foot Coordination Gross Motor Movements are Fluid and Coordinated: No Fine Motor Movements are Fluid and Coordinated: No Coordination and Movement Description:  grossly uncoordinated due to mild L hemi, generalized weakness, decreaed balance/postural control, and decreased endurance Finger Nose Finger Test: WFL on RUE, unable to perform on LUE Heel Shin Test: [facility] but slow on RLE, decreased speed and coordination on LLE Motor  Motor Motor: Hemiplegia;Abnormal postural alignment and control Motor - Skilled Clinical Observations: grossly uncoordinated due to mild L hemi, generalized weakness, decreaed balance/postural control, and decreased endurance  Trunk/Postural Assessment  Cervical Assessment Cervical Assessment: Within Functional Limits Thoracic Assessment Thoracic Assessment: Within Functional Limits Lumbar Assessment Lumbar Assessment: Exceptions to [facility] (posterior pelvic tilt in sitting) Postural Control Postural Control: Deficits on evaluation  Balance Balance Balance Assessed: Yes Static Sitting Balance Static Sitting - Balance Support: Feet supported;Bilateral upper extremity supported Static Sitting - Level of Assistance: 5: Stand by assistance (supervision) Dynamic Sitting Balance Dynamic Sitting - Balance Support: No upper extremity supported;Feet unsupported Dynamic Sitting - Level of Assistance: 4: Min assist Static Standing Balance Static Standing - Balance Support: No upper extremity supported Static Standing - Level of Assistance: 5: Stand by assistance (CGA) Dynamic Standing Balance Dynamic Standing - Balance Support: No upper extremity supported Dynamic Standing - Level of Assistance: 4: Min assist Dynamic Standing - Comments: with transfers and gait Extremity/Trunk Assessment RUE Assessment RUE Assessment: Within Functional Limits General Strength Comments: grossly 5/5 LUE Assessment LUE Assessment: Exceptions to [facility] Passive Range of Motion (PROM) Comments: WNL Active Range of Motion (AROM) Comments: shoulder flexion to 90*, elbow flexion 90*, minimal wrist flexion/extension, loose gross grasp General Strength  Comments: LUE 2+ deltoid, bicep, tricep, 1/5 wrist  Care Tool Care Tool Self Care Eating   Eating Assist Level: Set up assist    Oral Care         Bathing   Body parts bathed by Shannon Garza: Left arm;Chest;Abdomen;Front perineal area;Right upper leg;Left upper leg;Face Body parts bathed by helper: Right arm;Buttocks;Right lower leg;Left lower leg   Assist Level: Moderate Assistance - Shannon Garza 50 - 74%    Upper Body Dressing(including orthotics)   What is the Shannon Garza wearing?: Dress   Assist Level: Moderate Assistance - Shannon Garza 50 - 74%    Lower Body Dressing (excluding footwear)   What is the Shannon Garza wearing?: Underwear/pull up Assist for lower body dressing: Maximal Assistance - Shannon Garza 25 - 49%    Putting on/Taking off footwear  What is the Shannon Garza wearing?: Non-skid slipper socks Assist for footwear: Total Assistance - Shannon Garza < 25%       Care Tool Toileting Toileting activity   Assist for toileting: Maximal Assistance - Shannon Garza 25 - 49%     Care Tool Bed Mobility Roll left and right activity   Roll left and right assist level: Supervision/Verbal cueing    Sit to lying activity   Sit to lying assist level: Moderate Assistance - Shannon Garza 50 - 74%    Lying to sitting edge of bed activity   Lying to sitting edge of bed assist level: Contact Guard/Touching assist     Care Tool Transfers Sit to stand transfer   Sit to stand assist level: Moderate Assistance - Shannon Garza 50 - 74%    Chair/bed transfer   Chair/bed transfer assist level: Moderate Assistance - Shannon Garza 50 - 74%     Toilet transfer   Assist Level: Moderate Assistance - Shannon Garza 50 - 74%     Care Tool Cognition Expression of Ideas and Wants Expression of Ideas and Wants: Without difficulty (complex and basic) - expresses complex messages without difficulty and with speech that is clear and easy to understand   Understanding Verbal and Non-Verbal Content Understanding Verbal and Non-Verbal Content:  Understands (complex and basic) - clear comprehension without cues or repetitions   Memory/Recall Ability *first 3 days only Memory/Recall Ability *first 3 days only: That he or she is in a hospital/hospital unit;Current season;Staff names and faces;Location of own room    Refer to Care Plan for Long Term Goals  SHORT TERM GOAL WEEK 1 OT Short Term Goal 1 (Week 1): Pt will complete toilet transfers ambulating with min assist OT Short Term Goal 2 (Week 1): Pt will complete LB dressing with min assist OT Short Term Goal 3 (Week 1): pt will utilize LUE as gross assist during self-care tasks  Recommendations for other services: Neuropsych and Therapeutic Recreation  Stress management and Other coping   Skilled Therapeutic Intervention OT eval completed with discussion of rehab process, OT purpose, POC, ELOS, and goals.  ADL assessment completed with focus on functional transfers and self-care retraining with bathing and dressing.  Pt received upright in recliner with nursing staff present.  Pt requesting to shower during session.  Therapist obtained tub bench for shower due to body habitus/weight.  Pt ambulated to room shower with RW with min assist.  Pt completed shower transfer with mod assist due to shower setup, pt with difficulty lifting LLE over shower ledge when transferring in and out of walk-in shower with ~5' ledge.  Pt completed bathing and dressing at sit > stand level with min assist for sit > stand and to wash buttocks while pt standing.  Pt required assistance to lift LUE to allow pt to wash underneath and assist to wash RUE due to L hemiplegia.  Pt demonstrating mild grasp on RW, but requires assist to place hand on RW.  Therapist guided pt with hand over hand to utilize LUE when applying deodorant, pt unable to maintain grasp or advance to underarm.  Max assist for LB dressing due to decreased bending and difficulty lifting LLE against gravity.  Pt completed sit > stand when pulling dress  over torso with min assist.  Pt remained upright in recliner with chair alarm on and all needs in reach.   ADL ADL Upper Body Dressing: Moderate assistance Where Assessed-Upper Body Dressing: Chair Lower Body Dressing: Maximal assistance Where Assessed-Lower Body Dressing: Chair Mobility  Bed Mobility Bed Mobility: Rolling Left;Left Sidelying to Sit Rolling Left: Supervision/Verbal cueing Left Sidelying to Sit: Contact Guard/Touching assist Transfers Sit to Stand: Minimal Assistance - Shannon Garza > 75% Stand to Sit: Contact Guard/Touching assist   Discharge Criteria: Shannon Garza will be discharged from OT if Shannon Garza refuses treatment 3 consecutive times without medical reason, if treatment goals not met, if there is a change in medical status, if Shannon Garza makes no progress towards goals or if Shannon Garza is discharged from hospital.  The above assessment, treatment plan, treatment alternatives and goals were discussed and mutually agreed upon: by Shannon Garza  Simonne Come 10/19/2020, 4:24 PM

## 2020-10-19 NOTE — Progress Notes (Signed)
Patient ID: Shannon Garza, female   DOB: 26-Oct-1965, 55 y.o.   MRN: [id number] Patient given information about new medication started for BP control. After reviewing possible side effects and other information about the medication, patient noted she did not want to take another medication that could do more harm than benefit her health. Referred patient to the MD for alternative treatment options. She notes she would like to return to her herbal supplements. Afraid of what medications will do to her as father took several medications and did worse instead of doing better with the medications and he died early in life. Pamelia Hoit

## 2020-10-19 NOTE — Evaluation (Signed)
Physical Therapy Assessment and Plan  Patient Details  Name: Shannon Garza MRN: [id number] Date of Birth: 30-Oct-1965  PT Diagnosis: Abnormal posture, Abnormality of gait, Coordination disorder, Difficulty walking, Hemiparesis non-dominant and Muscle weakness Rehab Potential: Good ELOS: 12-14 days   Today's Date: 10/19/2020 PT Individual Time: 8786-7672 PT Individual Time Calculation (min): 55 min    Hospital Problem: Principal Problem:   Subcortical infarction [facility]) Active Problems:   CVA (cerebral vascular accident) [facility])   Past Medical History:  Past Medical History:  Diagnosis Date  . Diabetes mellitus without complication ([place])   . Hypertension    Past Surgical History: History reviewed. No pertinent surgical history.  Assessment & Plan Clinical Impression: Patient is a 55 y.o. year old female with history ofprediabetes, CVA 2018 and hypertension. Patient opted for herbal medicine route rather than taking other medications after CVA. Sister can provide assistance. Presented 10/12/2020 with left-sided weakness 10/11/2020. Noted blood pressure systolic [id number]. CT/MRI showed area of restricted diffusion involving the posterior limb of internal capsule on the right extending into the corona radiata consistent with acute/subacute infarction. Remote infarct in left basal ganglia and left side of the pons. CT angiogram of the head and neck no hemodynamically significant stenosis in the neck. 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 Echocardiogram with ejection fraction of 60 to 65% no wall motion abnormalities. Admission chemistries urine drug screen negative potassium 3.2 glucose 126 alcohol negative hemoglobin A1c 5.6. Currently maintained on aspirin and Plavix for CVA prophylaxis x3 weeks and aspirin alone. Subcutaneous Lovenox for DVT prophylaxis. Tolerating a regular diet.  Patient currently requires min with mobility secondary to muscle weakness,  decreased cardiorespiratoy endurance, impaired timing and sequencing, decreased coordination and decreased motor planning and decreased sitting balance, decreased standing balance, decreased postural control, hemiplegia and decreased balance strategies.  Prior to hospitalization, patient was independent  with mobility and lived with Family in a House home.  Home access is 2Stairs to enter.  Patient will benefit from skilled PT intervention to maximize safe functional mobility, minimize fall risk and decrease caregiver burden for planned discharge home with 24 hour supervision.  Anticipate patient will benefit from follow up OP at discharge.  PT - End of Session Activity Tolerance: Tolerates 30+ min activity with multiple rests Endurance Deficit: Yes Endurance Deficit Description: pt reported mild fatigue PT Assessment Rehab Potential (ACUTE/IP ONLY): Good PT Barriers to Discharge: Inaccessible home environment;Home environment access/layout;Weight PT Barriers to Discharge Comments: 2 STE with 1 rails, body habitus, mild L hemi PT Patient demonstrates impairments in the following area(s): Balance;Endurance;Motor;Nutrition;Pain PT Transfers Functional Problem(s): Bed Mobility;Bed to Chair;Car;Furniture PT Locomotion Functional Problem(s): Ambulation;Wheelchair Mobility;Stairs PT Plan PT Intensity: Minimum of 1-2 x/day ,45 to 90 minutes PT Frequency: 5 out of 7 days PT Duration Estimated Length of Stay: 12-14 days PT Treatment/Interventions: Ambulation/gait training;Discharge planning;Functional mobility training;Psychosocial support;Therapeutic Activities;Balance/vestibular training;Disease management/prevention;Neuromuscular re-education;Skin care/wound management;Therapeutic Exercise;Wheelchair propulsion/positioning;DME/adaptive equipment instruction;Pain management;Splinting/orthotics;UE/LE Strength taining/ROM;Community reintegration;Functional electrical stimulation;Patient/family  education;Stair training;UE/LE Coordination activities PT Transfers Anticipated Outcome(s): supervision with LRAD PT Locomotion Anticipated Outcome(s): supervision with LRAD PT Recommendation Follow Up Recommendations: Outpatient PT Patient destination: Home Equipment Recommended: To be determined  PT Evaluation Precautions/Restrictions Precautions Precautions: Fall Precaution Comments: mild L hemi Restrictions Weight Bearing Restrictions: No Home Living/Prior Functioning Home Living Living Arrangements: Other relatives Available Help at Discharge: Family;Available 24 hours/day Type of Home: House Home Access: Stairs to enter [employer] of Steps: 2 Entrance Stairs-Rails: Right;Left;Can reach both Home Layout: Multi-level;Bed/bath upstairs Alternate Level  Stairs-Number of Steps: full flight Alternate Level Stairs-Rails: Left Bathroom Shower/Tub: Multimedia programmer: Handicapped height Bathroom Accessibility: Yes Additional Comments: could have access to her brother's walk in shower on 2nd floor  Lives With: Family Prior Function Level of Independence: Independent with basic ADLs;Independent with transfers;Independent with homemaking with ambulation;Independent with gait  Able to Take Stairs?: Yes Driving: Yes 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 Cognition Overall Cognitive Status: Within Functional Limits for tasks assessed Arousal/Alertness: Awake/alert Orientation Level: Oriented X4 Memory: Appears intact Awareness: Impaired Problem Solving: Appears intact Safety/Judgment: Appears intact Sensation Sensation Light Touch: Appears Intact Proprioception: Appears Intact Additional Comments: pt reports tingling in L 4th and 5th metatarsal and along plantar surface of L foot Coordination Gross Motor Movements are Fluid and Coordinated: No Fine Motor Movements are Fluid and Coordinated: No Coordination and  Movement Description: grossly uncoordinated due to mild L hemi, generalized weakness, decreaed balance/postural control, and decreased endurance Finger Nose Finger Test: WFL on RUE, unable to perform on LUE Heel Shin Test: [facility] but slow on RLE, decreased speed and coordination on LLE Motor  Motor Motor: Hemiplegia;Abnormal postural alignment and control Motor - Skilled Clinical Observations: grossly uncoordinated due to mild L hemi, generalized weakness, decreaed balance/postural control, and decreased endurance  Trunk/Postural Assessment  Cervical Assessment Cervical Assessment: Within Functional Limits Thoracic Assessment Thoracic Assessment: Within Functional Limits Lumbar Assessment Lumbar Assessment: Exceptions to [facility] (posterior pelvic tilt in sitting) Postural Control Postural Control: Deficits on evaluation  Balance Balance Balance Assessed: Yes Static Sitting Balance Static Sitting - Balance Support: Feet supported;Bilateral upper extremity supported Static Sitting - Level of Assistance: 5: Stand by assistance (supervision) Dynamic Sitting Balance Dynamic Sitting - Balance Support: No upper extremity supported;Feet unsupported Dynamic Sitting - Level of Assistance: 4: Min assist Static Standing Balance Static Standing - Balance Support: No upper extremity supported Static Standing - Level of Assistance: 5: Stand by assistance (CGA) Dynamic Standing Balance Dynamic Standing - Balance Support: No upper extremity supported Dynamic Standing - Level of Assistance: 4: Min assist Dynamic Standing - Comments: with transfers and gait Extremity Assessment  RLE Assessment RLE Assessment: Within Functional Limits General Strength Comments: grossly generalized to 4/5 LLE Assessment LLE Assessment: Exceptions to [facility] General Strength Comments: grossly generalized to 4-/5  Care Tool Care Tool Bed Mobility Roll left and right activity   Roll left and right assist level:  Supervision/Verbal cueing    Sit to lying activity        Lying to sitting edge of bed activity   Lying to sitting edge of bed assist level: Contact Guard/Touching assist     Care Tool Transfers Sit to stand transfer   Sit to stand assist level: Minimal Assistance - Patient > 75%    Chair/bed transfer   Chair/bed transfer assist level: Minimal Assistance - Patient > 75%     Physiological scientist transfer assist level: Minimal Assistance - Patient > 75%      Care Tool Locomotion Ambulation   Assist level: Minimal Assistance - Patient > 75% Assistive device: No Device Max distance: 93f  Walk 10 feet activity   Assist level: Minimal Assistance - Patient > 75% Assistive device: No Device   Walk 50 feet with 2 turns activity   Assist level: Minimal Assistance - Patient > 75% Assistive device: No Device  Walk 150 feet activity Walk 150 feet activity did not occur: Safety/medical concerns (fatigue,  L hemi, generalized weakness)      Walk 10 feet on uneven surfaces activity Walk 10 feet on uneven surfaces activity did not occur: Safety/medical concerns (fatigue, L hemi, generalized weakness)      Stairs Stair activity did not occur: Safety/medical concerns (fatigue, L hemi, generalized weakness)        Walk up/down 1 step activity Walk up/down 1 step or curb (drop down) activity did not occur: Safety/medical concerns (fatigue, L hemi, generalized weakness)     Walk up/down 4 steps activity did not occuR: Safety/medical concerns (fatigue, L hemi, generalized weakness)  Walk up/down 4 steps activity      Walk up/down 12 steps activity Walk up/down 12 steps activity did not occur: Safety/medical concerns (fatigue, L hemi, generalized weakness)      Pick up small objects from floor Pick up small object from the floor (from standing position) activity did not occur: Safety/medical concerns (fatigue, L hemi, generalized weakness)      Wheelchair Will  patient use wheelchair at discharge?: No Type of Wheelchair: Manual   Wheelchair assist level: Supervision/Verbal cueing Max wheelchair distance: 9f  Wheel 50 feet with 2 turns activity   Assist Level: Minimal Assistance - Patient > 75%  Wheel 150 feet activity   Assist Level: Total Assistance - Patient < 25%    Refer to Care Plan for Long Term Goals  SHORT TERM GOAL WEEK 1 PT Short Term Goal 1 (Week 1): pt will transfer bed<>chair with LRAD and CGA PT Short Term Goal 2 (Week 1): pt will navigate 2 steps with 2 rails and min A PT Short Term Goal 3 (Week 1): pt will ambulate 52fwith LRAD and CGA  Recommendations for other services: None   Skilled Therapeutic Intervention Evaluation completed (see details above and below) with education on PT POC and goals and individual treatment initiated with focus on functional mobility/transfers, generalized strengthening, dynamic standing balance/coordination, ambulation, simulated car transfers, and improved activity tolerance. Received pt supine in bed, pt educated on PT evaluation, CIR policies, and therapy schedule and agreeable. Pt reported pain in L side of abdomen but declined any pain interventions. RN present during session to administer medications. Pt transferred supine<>sitting EOB with HOB elevated and use of bedrails with CGA and increased time. Pt able to maintain static sitting balance with supervision overall but with mild posterior lean with dynamic sitting balance. Donned second gown with mod A to thread LUE through and transferred sit<>stand with min handheld assist and transferred stand<>pivot bed<>WC without AD and min A. Pt performed WC mobility 3072fsing BLE and RUE and supervision. Pt with increased difficulty propelling with BLE and would benefit from lower height WC. Pt transported to 52M and ambulated [id number] AD and min A. Pt demonstrated decreased cadence, decreased weight shifting to L, decreased bilateral foot  clearance, and wide BOS. Pt then performed simulated car transfer without AD and min A with cues for safe entry into car as pt attempting to side step into car and with LOB to L. Pot [facility] and transferred WC<>recliner stand<>pivot without AD and min A. Concluded session with pt sitting in recliner, needs within reach, and chair pad alarm on. Safety plan updated.   Mobility Bed Mobility Bed Mobility: Rolling Left;Left Sidelying to Sit Rolling Left: Supervision/Verbal cueing Left Sidelying to Sit: Contact Guard/Touching assist Transfers Transfers: Sit to Stand;Stand to Sit;Stand Pivot Transfers Sit to Stand: Minimal Assistance - Patient > 75% Stand to  Sit: Contact Guard/Touching assist Stand Pivot Transfers: Minimal Assistance - Patient > 75% Stand Pivot Transfer Details: Verbal cues for technique;Verbal cues for sequencing Stand Pivot Transfer Details (indicate cue type and reason): verbal cues for hand placement Transfer (Assistive device): None Locomotion  Gait Ambulation: Yes Gait Assistance: Minimal Assistance - Patient > 75% Gait Distance (Feet): 50 Feet Assistive device: None Gait Assistance Details: Verbal cues for technique Gait Assistance Details: verbal cues to avoid distractions in hallway Gait Gait: Yes Gait Pattern: Impaired Gait Pattern: Decreased stance time - left;Decreased trunk rotation;Wide base of support;Step-to pattern;Decreased stride length;Decreased weight shift to left;Decreased step length - right;Decreased step length - left;Poor foot clearance - left;Poor foot clearance - right Gait velocity: decreased Wheelchair Mobility Wheelchair Mobility: Yes Wheelchair Assistance: Chartered loss adjuster: Both lower extermities;Right upper extremity Wheelchair Parts Management: Supervision/cueing Distance: 18f   Discharge Criteria: Patient will be discharged from PT if patient refuses treatment 3 consecutive  times without medical reason, if treatment goals not met, if there is a change in medical status, if patient makes no progress towards goals or if patient is discharged from hospital.  The above assessment, treatment plan, treatment alternatives and goals were discussed and mutually agreed upon: by patient  AAlfonse AlpersPT, DPT  10/19/2020, 9:55 AM

## 2020-10-20 LAB — BASIC METABOLIC PANEL
Anion gap: 9 (ref 5–15)
BUN: 6 mg/dL (ref 6–20)
CO2: 26 mmol/L (ref 22–32)
Calcium: 10.2 mg/dL (ref 8.9–10.3)
Chloride: 104 mmol/L (ref 98–111)
Creatinine, Ser: 0.8 mg/dL (ref 0.44–1.00)
GFR, Estimated: >60 mL/min (ref >60)
Glucose, Bld: 138 mg/dL — ABNORMAL HIGH (ref 70–99)
Potassium: 4.1 mmol/L (ref 3.5–5.1)
Sodium: 139 mmol/L (ref 135–145)

## 2020-10-20 MED ORDER — HYDRALAZINE HCL 25 MG PO TABS: 25.0000 mg | ORAL_TABLET | Freq: Three times a day (TID) | ORAL | Status: DC

## 2020-10-20 NOTE — Progress Notes (Signed)
Speech Language Pathology Daily Session Note  Patient Details  Name: Shannon Garza MRN: [id number] Date of Birth: Jan 04, 1966  Today's Date: 10/20/2020 SLP Individual Time: 8381-8403 SLP Individual Time Calculation (min): 43 min  Short Term Goals: Week 1: SLP Short Term Goal 1 (Week 1): Pt will complete complex problem solving tasks with min A verbal cues. SLP Short Term Goal 2 (Week 1): Pt will self-monitor and self-correct errors in problem solving tasks with supervision A verbal cues. SLP Short Term Goal 3 (Week 1): Pt will demonstrate anticipatory awareness skills listing 3 activities inwhich she will need asistance with at discharge. SLP Short Term Goal 4 (Week 1): Pt will demonstrated short term recall within tasks with supervision A verbal cues. SLP Short Term Goal 4 - Progress (Week 1): Met  Skilled Therapeutic Interventions:Skilled ST services focused on cognitive skills. SLP continued administration of CLQT subsections, pt scored mild deficits in executive functioning and WFL in memory and attention. SLP facilitated complex problem solving in money management task, pt required supervision A verbal cues fade to mod I for error awareness. SLP d/c memory goal and upgraded goals to Mod I with pt's improved functional progress noted today. Pt was left in room with call bell within reach and chair alarm set. SLP recommends to continue skilled services.      Pain Pain Assessment Pain Score: 0-No pain  Therapy/Group: Individual Therapy  Miran Kautzman  [facility] 10/20/2020, 3:26 PM

## 2020-10-20 NOTE — Progress Notes (Signed)
PROGRESS NOTE   Subjective/Complaints: Patient states she was reading the side effects of hydralazine and would prefer that this medication is stopped. Discussed risks of HTN, including repeat stroke, and patient expresses understanding  ROS: + left sided weakness, denies pain  Objective:   No results found. Recent Labs    10/18/20 0330 10/19/20 0430  WBC 4.8 4.4  HGB 13.6 13.3  HCT 43.5 39.4  PLT 153 141*   Recent Labs    10/19/20 0430 10/20/20 0705  NA 139 139  K 3.4* 4.1  CL 104 104  CO2 26 26  GLUCOSE 100* 138*  BUN 9 6  CREATININE 0.83 0.80  CALCIUM 10.1 10.2    Intake/Output Summary (Last 24 hours) at 10/20/2020 1501 Last data filed at 10/20/2020 0700 Gross per 24 hour  Intake 360 ml  Output --  Net 360 ml        Physical Exam: Vital Signs Blood pressure (!) 154/66, pulse (!) 58, temperature 97.8 F (36.6 C), temperature source Oral, resp. rate 18, height 5\' 3"  (1.6 m), SpO2 98 %. Gen: no distress, normal appearing HEENT: oral mucosa pink and moist, NCAT Cardio: Bradycardic Chest: normal effort, normal rate of breathing Abd: soft, non-distended Ext: no edema Psych: pleasant, normal affect Skin: intact Neurological:  Mental Status: She is alert.  Comments: Patient is alert in no acute distress. Speech is mildly dysarthric but intelligible. Oriented x3 and follows commands. Fair awareness of deficits. Left central 7. Motor: LUE 2+ deltoid, biceps, triceps, 3/5 wrist and 1/5 hand grip, LLE 4/5 prox to distal. Senses pain and LT in all 4's. No resting tone .DTR's 1+.  Psychiatric:  Mood and Affect: Moodnormal.  Behavior: Behaviornormal.    Assessment/Plan: 1. Functional deficits which require 3+ hours per day of interdisciplinary therapy in a comprehensive inpatient rehab setting.  Physiatrist is providing close team supervision and 24 hour management of active medical  problems listed below.  Physiatrist and rehab team continue to assess barriers to discharge/monitor patient progress toward functional and medical goals  Care Tool:  Bathing    Body parts bathed by patient: Left arm,Chest,Abdomen,Front perineal area,Right upper leg,Left upper leg,Face   Body parts bathed by helper: Right arm,Buttocks,Right lower leg,Left lower leg     Bathing assist Assist Level: Moderate Assistance - Patient 50 - 74%     Upper Body Dressing/Undressing Upper body dressing   What is the patient wearing?: Dress    Upper body assist Assist Level: Moderate Assistance - Patient 50 - 74%    Lower Body Dressing/Undressing Lower body dressing      What is the patient wearing?: Underwear/pull up     Lower body assist Assist for lower body dressing: Maximal Assistance - Patient 25 - 49%     Toileting Toileting    Toileting assist Assist for toileting: Moderate Assistance - Patient 50 - 74%     Transfers Chair/bed transfer  Transfers assist     Chair/bed transfer assist level: Moderate Assistance - Patient 50 - 74%     Locomotion Ambulation   Ambulation assist      Assist level: Minimal Assistance - Patient > 75% Assistive device: Walker-rolling Max  distance: 189ft   Walk 10 feet activity   Assist     Assist level: Minimal Assistance - Patient > 75% Assistive device: Walker-rolling   Walk 50 feet activity   Assist    Assist level: Minimal Assistance - Patient > 75% Assistive device: Walker-rolling    Walk 150 feet activity   Assist Walk 150 feet activity did not occur: Safety/medical concerns (fatigue, L hemi, generalized weakness)  Assist level: Minimal Assistance - Patient > 75% Assistive device: Walker-rolling    Walk 10 feet on uneven surface  activity   Assist Walk 10 feet on uneven surfaces activity did not occur: Safety/medical concerns (fatigue, L hemi, generalized weakness)          Wheelchair     Assist Will patient use wheelchair at discharge?: No Type of Wheelchair: Manual    Wheelchair assist level: Supervision/Verbal cueing Max wheelchair distance: 54ft    Wheelchair 50 feet with 2 turns activity    Assist        Assist Level: Minimal Assistance - Patient > 75%   Wheelchair 150 feet activity     Assist      Assist Level: Total Assistance - Patient < 25%   Blood pressure (!) 154/66, pulse (!) 58, temperature 97.8 F (36.6 C), temperature source Oral, resp. rate 18, height 5\' 3"  (1.6 m), SpO2 98 %.  Medical Problem List and Plan: 1.Left hemiparesissecondary to rightPosterior limb internal capsule/Corona radiate as well as history of CVA 2018 -patient may shower -ELOS/Goals: 10-12 days. mod I to supervision with PT, OT, SLP -WHO LUE  -Continue CIR 2. Antithrombotics: -DVT/anticoagulation:Continue Lovenox- currently ambulating MinA 50 feet -antiplatelet therapy: Aspirin 81 mg daily and Plavix75 mgx3 weeks then aspirin alone  3. Pain Management:Tylenol as needed 4. Mood:Provide emotional support -antipsychotic agents: N/A 5. Neuropsych: This patientiscapable of making decisions on herown behalf. 6. Skin/Wound Care:Routine skin checks. May d/c IV 7. Fluids/Electrolytes/Nutrition:Routine in and outs with follow-up chemistries 8. Hypertension. Lisinopril 20 mg daily. Monitor with increased mobility. Stopped hydralazine as per patient's preference given her concern regarding side effects. Discussed risk of stroke with uncontrolled BP.  9. Hyperlipidemia. Continue Lipitor 10. Prediabetes. Hemoglobin A1c 5.6. CBGs discontinued 11. Bradycardic to 50s: monitor HR TID 12. Morbid obesity: providing regular dietary education 13. Hypokalemia: supplement and repeat K+ 2/23 14. Hypoalbuminemia: advised high protein foods- nuts are a great source given vegan  status. 15. Disposition: She is from 3/23. Will assist with FMLA paperwork    LOS: 2 days A FACE TO FACE EVALUATION WAS PERFORMED  [place] P Dereonna Lensing 10/20/2020, 3:01 PM

## 2020-10-20 NOTE — Progress Notes (Signed)
Occupational Therapy Session Note  Patient Details  Name: Shannon Garza MRN: [id number] Date of Birth: 08/15/1966  Today's Date: 10/20/2020 OT Individual Time: 1305-1405 OT Individual Time Calculation (min): 60 min    Short Term Goals: Week 1:  OT Short Term Goal 1 (Week 1): Pt will complete toilet transfers ambulating with min assist OT Short Term Goal 2 (Week 1): Pt will complete LB dressing with min assist OT Short Term Goal 3 (Week 1): pt will utilize LUE as gross assist during self-care tasks  Skilled Therapeutic Interventions/Progress Updates:    Treatment session with focus on self-care retraining and functional transfers.  Pt received upright in recliner expressing desire to shower.  Pt transferred to w/c ambulating with RW with min assist.  Therapist transported pt to ADL tubroom to practice tub/shower transfers with use of tub bench (as pt home setup).  Pt completed transfer to tub bench with RW and min assist, then able to advance BLE over tub ledge with CGA.  Pt completed bathing with assist to wash lower legs and buttocks when standing, completing sit > stand with min assist.  Pt able to wash perineal area in standing with CGA.  Therapist assisted pt with washing RUE with hand over hand and facilitation at elbow to increase reach and ability to wash RUE.  Pt demonstrating minimal grasp to maintain grasp on wash cloth.  Pt completed dressing at w/c level.  Pt attempted to don bra but due to body habitus and decreased use of LUE, pt decided to not wear it.  Pt required mod assist to don overhead dress with assist to thread RUE and then pull dress over torso.  Max assist to thread pant legs of underwear, pt then able to stand min assist and pull pants up over R hip but required assist to pull over L hip. Pt returned to room and transferred back to recliner ambulating min assist with RW.  Pt remained upright with chair alarm on and all needs in reach.  Therapy Documentation Precautions:   Precautions Precautions: Fall Precaution Comments: L hemi Restrictions Weight Bearing Restrictions: No General:   Vital Signs: Therapy Vitals Temp: 99.1 F (37.3 C) Temp Source: Oral Pulse Rate: 60 Resp: 16 BP: (!) 157/79 Patient Position (if appropriate): Sitting Oxygen Therapy SpO2: 97 % O2 Device: Room Air Pain: Pain Assessment Pain Score: 0-No pain   Therapy/Group: Individual Therapy  Rosalio Loud 10/20/2020, 3:43 PM

## 2020-10-20 NOTE — Progress Notes (Signed)
Physical Therapy Session Note  Patient Details  Name: Shannon Garza MRN: [id number] Date of Birth: [birth year]  Today's Date: 10/20/2020 PT Individual Time: 1856-3149 PT Individual Time Calculation (min): 56 min   Short Term Goals: Week 1:  PT Short Term Goal 1 (Week 1): pt will transfer bed<>chair with LRAD and CGA PT Short Term Goal 2 (Week 1): pt will navigate 2 steps with 2 rails and min A PT Short Term Goal 3 (Week 1): pt will ambulate 77ft with LRAD and CGA  Skilled Therapeutic Interventions/Progress Updates:   Received pt supine in bed, pt agreeable to therapy, and reported mild soreness in low back from fall yesterday but declined any pain interventions. Pt reported tipping over sideways on commode yesterday after thinking she had more activation of her LUE than she actually did, causing her to loose her balance; however she denied any injury. Session with emphasis on functional mobility/transfers, toileting, dressing, generalized strengthening, dynamic standing balance/coordination, gait training, and improved activity tolerance. Pt requested to wait to get dressed for OT but reported urge to urinate and transferred supine<>sitting EOB with HOB elevated and use of bedrails with supervision and increased time with cues for LUE management. Pt transferred sit<>stand without RW and min A and attempted to ambulate to bathroom but reported feeling much weaker this morning and unable to shift weight to LLE to advance RLE, therefore ambulated 31ft with RW and min A/CGA to bathroom. Pt unable to hold urine and began urinating while walking soiling brief and floor. Therapist cleaned floor and doffed soiled brief and non-skid socks with total A. Therapist washed pt's feet and donned clean non-skid socks and brief with total A and doffed soiled gown and donned 2 clean gowns with mod A for LUE management. Pt transferred sit<>stand with RW and CGA and able to wash front with CGA but required total A to  wash buttocks in standing due to body habitus. Pt ambulated 34ft with RW and CGA to WC and performed the following exercises with supervision/CGA and verbal cues for technique: -seated hip flexion 2x15 bilaterally  -seated LAQ 2x15 bilaterally -standing marching x12 bilaterally -standing mini-squats 2x10 Pt then ambulated 132ft with RW and CGA/min A to manage RW. Pt demonstrated decreased cadence, wide BOS, decreased bilateral foot clearance, decreased weight shifting to L, and with tendency to veer to R. Pt ambulated additional 85ft with RW and CGA to recliner. Concluded session with pt sitting in recliner, needs within reach, and chair pad alarm on.   Therapy Documentation Precautions:  Precautions Precautions: Fall Precaution Comments: L hemi Restrictions Weight Bearing Restrictions: No  Therapy/Group: Individual Therapy Martin Majestic PT, DPT   10/20/2020, 7:26 AM

## 2020-10-20 NOTE — Plan of Care (Signed)
  Problem: RH Problem Solving Goal: LTG Patient will demonstrate problem solving for (SLP) Description: LTG:  Patient will demonstrate problem solving for basic/complex daily situations with cues  (SLP) Flowsheets (Taken 10/20/2020 1433) LTG Patient will demonstrate problem solving for: (upgraded) Modified Independent Note: Upgraded    Problem: RH Memory Goal: LTG Patient will use memory compensatory aids to (SLP) Description: LTG:  Patient will use memory compensatory aids to recall biographical/new, daily complex information with cues (SLP) Outcome: Not Applicable Flowsheets (Taken 10/20/2020 1433) LTG: Patient will use memory compensatory aids to (SLP): (upon further testing [facility]) -- Note: Upon further testing WFL   Problem: RH Awareness Goal: LTG: Patient will demonstrate awareness during functional activites type of (SLP) Description: LTG: Patient will demonstrate awareness during functional activites type of (SLP) Flowsheets (Taken 10/20/2020 1433) LTG: Patient will demonstrate awareness during cognitive/linguistic activities with assistance of (SLP): (upgraded) Modified Independent Note: Upgraded

## 2020-10-21 MED ORDER — LISINOPRIL 20 MG PO TABS
30.0000 mg | ORAL_TABLET | Freq: Every day | ORAL | Status: DC
  Administered 2020-10-22: 30 mg via ORAL
  Filled 2020-10-21: qty 1

## 2020-10-21 NOTE — Progress Notes (Signed)
Speech Language Pathology Daily Session Note  Patient Details  Name: Shannon Garza MRN: [id number] Date of Birth: [birth year]  Today's Date: 10/21/2020 SLP Individual Time: 1030-1100 SLP Individual Time Calculation (min): 30 min  Short Term Goals: Week 1: SLP Short Term Goal 1 (Week 1): Pt will complete complex problem solving tasks with min A verbal cues. SLP Short Term Goal 2 (Week 1): Pt will self-monitor and self-correct errors in problem solving tasks with supervision A verbal cues. SLP Short Term Goal 3 (Week 1): Pt will demonstrate anticipatory awareness skills listing 3 activities inwhich she will need asistance with at discharge. SLP Short Term Goal 4 (Week 1): Pt will demonstrated short term recall within tasks with supervision A verbal cues. SLP Short Term Goal 4 - Progress (Week 1): Met  Skilled Therapeutic Interventions:   Patient seen for skilled ST session to address cognitive-linguistic goals. She completed Daily Math Problems from [facility] and received a score of 70% however patient did indicate that she has never been good with percentages. Patient did indicate that she has had some word finding issues describing that her speech "gets twisted". SLP did observe a couple instances of patient having difficulty describing when talking about her line of work. 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 SLP to plan some more simulated tasks related to her work. Patient continues to benefit from skilled SLP intervention to maximize cognitive-linguistic abilities prior to discharge.  Pain Pain Assessment Pain Scale: 0-10 Pain Score: 0-No pain Pain Type: Acute pain Pain Location: Hand Pain Orientation: Left  Therapy/Group: Individual Therapy  Sonia Baller, MA, CCC-SLP Speech Therapy

## 2020-10-21 NOTE — Progress Notes (Signed)
Occupational Therapy Session Note  Patient Details  Name: Shannon Garza MRN: [id number] Date of Birth: 28-Jul-1966  Today's Date: 10/21/2020 OT Individual Time: 9833-8250 OT Individual Time Calculation (min): 57 min    Short Term Goals: Week 1:  OT Short Term Goal 1 (Week 1): Pt will complete toilet transfers ambulating with min assist OT Short Term Goal 2 (Week 1): Pt will complete LB dressing with min assist OT Short Term Goal 3 (Week 1): pt will utilize LUE as gross assist during self-care tasks  Skilled Therapeutic Interventions/Progress Updates:    1:1 pt received in bed agreeable to OT and dressing. Pt declines shower this morning. Pt dons dress, cami and sweater with MOD A overall pt requires VC for hemi dressing and Min-MOD A to power up sit to stand with posterior lean req VC for correction. Pt stan dpivot  transfer to w/c with MIN A after sit to stand. Pt grooms at sink with incorporation of RUE into bimanual tasks with trace activation of gross grasp. Total A transport to tx gym for time management. Pt focuses on gross grasp and release, reach and decreasing compensatory movements picking blocks up from the table with facilitation of wrsit and digit extension and tactile cues at shoulder/scap. Pt completes towel glides up hill on wedge for shoulder flex/ext & horizontal ab/adduciton with min manual resistance. Exited session with pt seated in recliner, exit alarm on and call light in reach   Therapy Documentation Precautions:  Precautions Precautions: Fall Precaution Comments: L hemi Restrictions Weight Bearing Restrictions: No General:   Vital Signs: Therapy Vitals Temp: 98.7 F (37.1 C) Temp Source: Oral Pulse Rate: (!) 59 Resp: 18 BP: (!) 160/79 Patient Position (if appropriate): Lying Oxygen Therapy SpO2: 98 % O2 Device: Room Air Pain:   ADL: ADL Upper Body Bathing: Minimal assistance Where Assessed-Upper Body Bathing: Shower Lower Body Bathing: Moderate  assistance Where Assessed-Lower Body Bathing: Shower Upper Body Dressing: Moderate assistance Where Assessed-Upper Body Dressing: Chair Lower Body Dressing: Maximal assistance Where Assessed-Lower Body Dressing: Chair Toilet Transfer: Moderate assistance Toilet Transfer Method: Ambulating Tub/Shower Transfer: Moderate assistance Tub/Shower Transfer Method: Ambulating Tub/Shower Equipment: Shower seat with back,Walk in shower,Grab bars Vision   Perception    Praxis   Exercises:   Other Treatments:     Therapy/Group: Individual Therapy  [name] 10/21/2020, 6:51 AM

## 2020-10-21 NOTE — Progress Notes (Addendum)
PROGRESS NOTE   Subjective/Complaints: FMLA paperwork completed and left for Becky, SW Chart reviewed, no issues overnight BP continues to be elevated, HR bradycardic -She complains of constipation  ROS: + left sided weakness, denies pain, +constipation  Objective:   No results found. Recent Labs    10/19/20 0430  WBC 4.4  HGB 13.3  HCT 39.4  PLT 141*   Recent Labs    10/19/20 0430 10/20/20 0705  NA 139 139  K 3.4* 4.1  CL 104 104  CO2 26 26  GLUCOSE 100* 138*  BUN 9 6  CREATININE 0.83 0.80  CALCIUM 10.1 10.2    Intake/Output Summary (Last 24 hours) at 10/21/2020 0851 Last data filed at 10/21/2020 0726 Gross per 24 hour  Intake 656 ml  Output -  Net 656 ml        Physical Exam: Vital Signs Blood pressure (!) 160/79, pulse (!) 59, temperature 98.7 F (37.1 C), temperature source Oral, resp. rate 18, height 5\' 3"  (1.6 m), SpO2 98 %. Gen: no distress, normal appearing HEENT: oral mucosa pink and moist, NCAT Cardio: Bradycardia Chest: normal effort, normal rate of breathing Abd: soft, non-distended Ext: no edema Psych: pleasant, normal affect Skin: intact Neurological:  Mental Status: She is alert.  Comments: Patient is alert in no acute distress. Speech is mildly dysarthric but intelligible. Oriented x3 and follows commands. Fair awareness of deficits. Left central 7. Motor: LUE 2+ deltoid, biceps, triceps, 3/5 wrist and 1/5 hand grip, LLE 4/5 prox to distal. Senses pain and LT in all 4's. No resting tone .DTR's 1+.  Psychiatric:  Mood and Affect: Moodnormal.  Behavior: Behaviornormal.    Assessment/Plan: 1. Functional deficits which require 3+ hours per day of interdisciplinary therapy in a comprehensive inpatient rehab setting.  Physiatrist is providing close team supervision and 24 hour management of active medical problems listed below.  Physiatrist and rehab team  continue to assess barriers to discharge/monitor patient progress toward functional and medical goals  Care Tool:  Bathing    Body parts bathed by patient: Left arm,Chest,Abdomen,Front perineal area,Right upper leg,Left upper leg,Face   Body parts bathed by helper: Right arm,Buttocks,Right lower leg,Left lower leg     Bathing assist Assist Level: Moderate Assistance - Patient 50 - 74%     Upper Body Dressing/Undressing Upper body dressing   What is the patient wearing?: Hospital gown only    Upper body assist Assist Level: Maximal Assistance - Patient 25 - 49%    Lower Body Dressing/Undressing Lower body dressing      What is the patient wearing?: Incontinence brief     Lower body assist Assist for lower body dressing: Maximal Assistance - Patient 25 - 49%     Toileting Toileting    Toileting assist Assist for toileting: Moderate Assistance - Patient 50 - 74%     Transfers Chair/bed transfer  Transfers assist     Chair/bed transfer assist level: Moderate Assistance - Patient 50 - 74%     Locomotion Ambulation   Ambulation assist      Assist level: Minimal Assistance - Patient > 75% Assistive device: Walker-rolling Max distance: 140ft   Walk 10 feet activity  Assist     Assist level: Minimal Assistance - Patient > 75% Assistive device: Walker-rolling   Walk 50 feet activity   Assist    Assist level: Minimal Assistance - Patient > 75% Assistive device: Walker-rolling    Walk 150 feet activity   Assist Walk 150 feet activity did not occur: Safety/medical concerns (fatigue, L hemi, generalized weakness)  Assist level: Minimal Assistance - Patient > 75% Assistive device: Walker-rolling    Walk 10 feet on uneven surface  activity   Assist Walk 10 feet on uneven surfaces activity did not occur: Safety/medical concerns (fatigue, L hemi, generalized weakness)         Wheelchair     Assist Will patient use wheelchair at  discharge?: No Type of Wheelchair: Manual    Wheelchair assist level: Supervision/Verbal cueing Max wheelchair distance: 54ft    Wheelchair 50 feet with 2 turns activity    Assist        Assist Level: Minimal Assistance - Patient > 75%   Wheelchair 150 feet activity     Assist      Assist Level: Total Assistance - Patient < 25%   Blood pressure (!) 160/79, pulse (!) 59, temperature 98.7 F (37.1 C), temperature source Oral, resp. rate 18, height 5\' 3"  (1.6 m), SpO2 98 %.  Medical Problem List and Plan: 1.Left hemiparesissecondary to rightPosterior limb internal capsule/Corona radiate as well as history of CVA 2018 -patient may shower -ELOS/Goals: 10-12 days. mod I to supervision with PT, OT, SLP -WHO LUE  Continue CIR 2. Antithrombotics: -DVT/anticoagulation:D/c Lovenox- currently ambulating >150 feet, discussed with patient and she is happy about this -antiplatelet therapy: Aspirin 81 mg daily and Plavix75 mgx3 weeks then aspirin alone  3. Pain Management:Tylenol as needed 4. Mood:Provide emotional support -antipsychotic agents: N/A 5. Neuropsych: This patientiscapable of making decisions on herown behalf. 6. Skin/Wound Care:Routine skin checks. May d/c IV 7. Fluids/Electrolytes/Nutrition:Routine in and outs with follow-up chemistries 8. Hypertension. Lisinopril 20 mg daily. Monitor with increased mobility. Stopped hydralazine as per patient's preference given her concern regarding side effects. Discussed risk of stroke with uncontrolled BP.   2/24: BP continues to be elevated. Since she does not like the side effect profile of hydralazine, discussed increasing Lisinopril and she is agreeable to this.  9. Hyperlipidemia. Continue Lipitor 10. Prediabetes. Hemoglobin A1c 5.6. CBGs discontinued 11. Bradycardic to 50s: monitor HR TID 12. Morbid obesity: providing regular dietary  education 13. Hypokalemia: supplement and repeat K+ 2/23 14. Hypoalbuminemia: advised high protein foods- nuts are a great source given vegan status. 15. Constipation: may use Swiss Kriss herbal supplement- she says it is with pharmacy- I have placed pharmacy consult to let them know she may use this. 16. Disposition: I have completed FMLA paperwork and left for Becky, SW   >35 minutes spent in completing patient's FMLA paperwork, reviewing her BP, discussing increasing Lisinopril and making this change, discussing constipation, placing pharm consult to allow use of her herbal supplement Swiss Kriss, review of her therapy notes, discussion with patient regarding d/c of Lovenox, discussing her dietary changes. >50% time spent in care and coordination of care  LOS: 3 days A FACE TO FACE EVALUATION WAS PERFORMED  3/23 Raulkar 10/21/2020, 8:51 AM

## 2020-10-21 NOTE — Progress Notes (Signed)
Physical Therapy Session Note  Patient Details  Name: Shannon Garza MRN: [id number] Date of Birth: 25-May-1966  Today's Date: 10/21/2020 PT Individual Time: 1300-1411 PT Individual Time Calculation (min): 71 min   Short Term Goals: Week 1:  PT Short Term Goal 1 (Week 1): pt will transfer bed<>chair with LRAD and CGA PT Short Term Goal 2 (Week 1): pt will navigate 2 steps with 2 rails and min A PT Short Term Goal 3 (Week 1): pt will ambulate 72ft with LRAD and CGA  Skilled Therapeutic Interventions/Progress Updates:   Received pt sitting in recliner, pt agreeable to therapy, and denied any pain during session. Session with emphasis on functional mobility/transfers, generalized strengthening, dynamic standing balance/coordination, NMR, motor control/sequencing, ambulation, stair navigation, toileting, and improved activity tolerance. Stand<>pivot recliner<>WC without AD and min A. Pt transported to 71M therapy gym in [facility] total A for time management purposes and navigated 4 steps with 2 rails and min/mod A ascending and descending with a step to pattern. Pt with 1 L lateral LOB requiring heavy mod A to correct. Pt ambulated 156ft x 1 with RW and CGA/min A and 142ft x 1 without AD and min A. Pt required x1 instance of min A to steer RW, otherwise demonstrates improvements in ability to keep RW straight. Pt demonstrates improved step/stride length when using RW. Pt transferred sit<>stand without AD and min A and performed alternating toe taps to 6in step 1x4, and 2x10 without UE support and mod A. Pt with 2 instances of L lateral LOB requiring heavy mod/max A for eccentric descent onto mat. Transitioned to toe taps to 3 cones x 3 trials bilaterally with emphasis on lateral weight shifting, dynamic standing balance, and motor control/coordination with min/mod A for balance. Therapist provided pt with 20x18 cushion for WC. Pt performed 3x10 mini-squats without AD and min A with emphasis on equal weight  distribution, eccentric control, and motor control/coordination. Pt transferred sit<>stand without UE support and CGA/min A 2x8 reps. Stand<>pivot mat<>WC with RW and CGA and dependent transport back to room on 5C in WC. Pt reported urge to use restroom and ambulated 45ft x 1 and 11ft x 1 without AD and min A. Pt required mod A for clothing management and pt unable to hold urine; therefore soiling brief. Pt able to continue to void and required total A to doff soiled brief and don clean one and total A for peri-care in standing due to body habitus. Concluded session with pt sitting in recliner, needs within reach, and chair pad alarm on.   Therapy Documentation Precautions:  Precautions Precautions: Fall Precaution Comments: L hemi Restrictions Weight Bearing Restrictions: No  Therapy/Group: Individual Therapy Martin Majestic PT, DPT   10/21/2020, 7:25 AM

## 2020-10-21 NOTE — Progress Notes (Signed)
Patient ID: Shannon Garza, female   DOB: May 07, 1966, 55 y.o.   MRN: [id number] Have faxed pt's FMLA forms and have given her the originals back.

## 2020-10-21 NOTE — IPOC Note (Signed)
Overall Plan of Care [facility]) Patient Details Name: Gracious Renken MRN: [id number] DOB: 01-Mar-1966  Admitting Diagnosis: Subcortical infarction [facility])  Hospital Problems: Principal Problem:   Subcortical infarction [facility]) Active Problems:   CVA (cerebral vascular accident) (HCC)     Functional Problem List: Nursing Bladder,Edema,Endurance,Medication Management,Safety  PT Balance,Endurance,Motor,Nutrition,Pain  OT Balance,Endurance,Motor,Pain,Perception,Safety,Sensory,Skin Integrity  SLP Cognition  TR         Basic ADL's: OT Grooming,Bathing,Dressing,Toileting     Advanced  ADL's: OT       Transfers: PT Bed Mobility,Bed to Chair,Car,Furniture  OT Toilet,Tub/Shower     Locomotion: PT Ambulation,Wheelchair Mobility,Stairs     Additional Impairments: OT Fuctional Use of Upper Extremity  SLP Social Cognition   Problem Solving,Awareness,Memory  TR      Anticipated Outcomes Item Anticipated Outcome  Self Feeding    Swallowing      Basic self-care  Supervision  Toileting  Supervision   Bathroom Transfers Supervision  Bowel/Bladder  mange with mod I assist  Transfers  supervision with LRAD  Locomotion  supervision with LRAD  Communication     Cognition  Supervision A  Pain  n/a  Safety/Judgment  maintain safety with cues/reminders   Therapy Plan: PT Intensity: Minimum of 1-2 x/day ,45 to 90 minutes PT Frequency: 5 out of 7 days PT Duration Estimated Length of Stay: 12-14 days OT Intensity: Minimum of 1-2 x/day, 45 to 90 minutes OT Frequency: 5 out of 7 days OT Duration/Estimated Length of Stay: 14-16 days SLP Intensity: Minumum of 1-2 x/day, 30 to 90 minutes SLP Frequency: 1 to 3 out of 7 days SLP Duration/Estimated Length of Stay: 12-14 days   Due to the current state of emergency, patients may not be receiving their 3-hours of Medicare-mandated therapy.   Team Interventions: Nursing Interventions Patient/Family Education,Bladder  Management,Disease Management/Prevention,Medication Management  PT interventions Ambulation/gait training,Discharge planning,Functional mobility training,Psychosocial support,Therapeutic Activities,Balance/vestibular training,Disease management/prevention,Neuromuscular re-education,Skin care/wound management,Therapeutic Exercise,Wheelchair propulsion/positioning,DME/adaptive equipment instruction,Pain management,Splinting/orthotics,UE/LE Strength taining/ROM,Community reintegration,Functional electrical stimulation,Patient/family education,Stair training,UE/LE Coordination activities  OT Interventions Balance/vestibular training,Community reintegration,Discharge planning,Disease Psychologist, prison and probation services stimulation,Functional mobility training,Neuromuscular re-education,Pain management,Patient/family education,Psychosocial support,Self Care/advanced ADL retraining,Splinting/orthotics,Therapeutic Activities,Therapeutic Exercise,UE/LE Strength taining/ROM,UE/LE Coordination activities  SLP Interventions Cognitive remediation/compensation,Cueing hierarchy,Medication managment,Functional tasks,Patient/family education  TR Interventions    SW/CM Interventions Discharge Planning,Psychosocial Support,Patient/Family Education   Barriers to Discharge MD  Medical stability  Nursing Decreased caregiver support,Home environment access/layout,Incontinence    PT Inaccessible home environment,Home environment access/layout,Weight 2 STE with 1 rails, body habitus, mild L hemi  OT Home environment access/layout full bathroom upstairs  SLP      SW       Team Discharge Planning: Destination: PT-Home ,OT- Home , SLP-Home Projected Follow-up: PT-Outpatient PT, OT-  24 hour supervision/assistance,Home health OT,Outpatient OT, SLP-Home Health SLP,Outpatient SLP,24 hour supervision/assistance Projected Equipment Needs: PT-To be determined, OT- To be determined,  SLP-None recommended by SLP Equipment Details: PT- , OT-  Patient/family involved in discharge planning: PT- Patient,  OT-Patient, SLP-Patient  MD ELOS: 10-12 days modI to S Medical Rehab Prognosis:  Excellent Assessment:  Mrs. Coia is a 55 year old woman who is admitted to CIR with left sided hemiparesis secondary to right posterior limb internal capsule/corona radiate stroke. Current medical issues include uncontrolled blood pressure, hypokalemia, and left sided weakness. K+ has been monitored and appropriately supplemented with good response.    See Team Conference Notes for weekly updates to the plan of care

## 2020-10-22 MED ORDER — MAGNESIUM CITRATE PO SOLN
1.0000 | Freq: Once | ORAL | Status: DC
  Filled 2020-10-22: qty 296

## 2020-10-22 MED ORDER — LISINOPRIL 40 MG PO TABS
40.0000 mg | ORAL_TABLET | Freq: Every day | ORAL | Status: DC
  Administered 2020-10-23 – 2020-10-29 (×7): 40 mg via ORAL
  Filled 2020-10-22 (×7): qty 1

## 2020-10-22 NOTE — Progress Notes (Signed)
Speech Language Pathology Daily Session Note  Patient Details  Name: Shannon Garza MRN: [id number] Date of Birth: 05/03/1966  Today's Date: 10/22/2020 SLP Individual Time: 1430-1530 SLP Individual Time Calculation (min): 60 min  Short Term Goals: Week 1: SLP Short Term Goal 1 (Week 1): Pt will complete complex problem solving tasks with min A verbal cues. SLP Short Term Goal 2 (Week 1): Pt will self-monitor and self-correct errors in problem solving tasks with supervision A verbal cues. SLP Short Term Goal 3 (Week 1): Pt will demonstrate anticipatory awareness skills listing 3 activities inwhich she will need asistance with at discharge. SLP Short Term Goal 4 (Week 1): Pt will demonstrated short term recall within tasks with supervision A verbal cues. SLP Short Term Goal 4 - Progress (Week 1): Met  Skilled Therapeutic Interventions:   Patient seen for skilled ST session focusing on cognitive function goals. SLP presented simulated scheduling task Pharmacist, hospital). During practice/trial of shortened version, patient able to complete without cues but with extra time and her reporting that task was similar to some of her job duties. SLP asked patient her plan/goals and she did state she is unsure if returning to work is something she would want to do but that she wants to continue to work recovery of her functional abilities. Patient started task of completing full simulated scheduling and benefited from minimal cues overall to organize information. Plan to complete next visit. Patient continues to benefit from skilled SLP intervention to maximize cognitive function prior to discharge.  Pain Pain Assessment Pain Scale: 0-10 Pain Score: 3  Pain Type: Acute pain Pain Location: Abdomen Pain Orientation: Right Pain Descriptors / Indicators: Cramping Pain Frequency: Intermittent Patients Stated Pain Goal: 0 Pain Intervention(s): Other (Comment) (Patient reported RN had already  given her tylenol for pain)  Therapy/Group: Individual Therapy   Sonia Baller, MA, CCC-SLP Speech Therapy

## 2020-10-22 NOTE — Progress Notes (Signed)
Occupational Therapy Session Note  Patient Details  Name: Shannon Garza MRN: [id number] Date of Birth: [birth year]  Today's Date: 10/22/2020 OT Individual Time: 8850-2774 OT Individual Time Calculation (min): 72 min    Short Term Goals: Week 1:  OT Short Term Goal 1 (Week 1): Pt will complete toilet transfers ambulating with min assist OT Short Term Goal 2 (Week 1): Pt will complete LB dressing with min assist OT Short Term Goal 3 (Week 1): pt will utilize LUE as gross assist during self-care tasks  Skilled Therapeutic Interventions/Progress Updates:    Treatment session with focus on functional transfers, dynamic standing balance, and LUE NMR.  Pt received upright in recliner, declining self-care tasks this AM but agreeable to therapy session.  Pt reports need to toilet.  Pt ambulated to sink with RW with CGA.  Pt completed toileting with mod assist, requiring assistance to adjust clothing prior to and post toileting.  Engaged in LUE NMR in sitting and standing with focus on shoulder flexion/extension, gross grasp progressing towards 3 jaw chuck, and manipulation of items.  Utilized Environmental education officer with magnets to challenge increased shoulder flexion, incorporating towel glides on vertical surface.  Pt demonstrating difficulty picking up magnets due to decreased finger isolation and grip.  Pt able to utilize gross grasp to move items from vertical surface and to pick larger items up from table top.  Engaged in reach and grasp with blocks, challenging pt to manipulate in hand and stack blocks.  Therapist providing intermittent tactile cues at shoulder/scapula to decrease compensatory movements.  Pt returned to room and transferred back to recliner CGA with RW.  Pt remained upright with chair alarm on and all needs in reach.  Therapy Documentation Precautions:  Precautions Precautions: Fall Precaution Comments: L hemi Restrictions Weight Bearing Restrictions: No General:   Vital  Signs: Therapy Vitals Pulse Rate: 60 BP: (!) 178/92 Pain: Pt with c/o pain in R hip. Applied heat at end of session - pt reports some relief after pain application.  Rn notified   Therapy/Group: Individual Therapy  Rosalio Loud 10/22/2020, 9:03 AM

## 2020-10-22 NOTE — Progress Notes (Signed)
PROGRESS NOTE   Subjective/Complaints: Discussed that it is [facility] policy not to allow non-FDA approved herbal supplements- patient prefers this to laxative medication. She notes improved left sided strength. Continues to be hypertensive, will increase Lisinopril further.   ROS: + left sided weakness, denies pain, +constipation  Objective:   No results found. No results for input(s): WBC, HGB, HCT, PLT in the last 72 hours. Recent Labs    10/20/20 0705  NA 139  K 4.1  CL 104  CO2 26  GLUCOSE 138*  BUN 6  CREATININE 0.80  CALCIUM 10.2    Intake/Output Summary (Last 24 hours) at 10/22/2020 1213 Last data filed at 10/22/2020 0313 Gross per 24 hour  Intake 592 ml  Output 400 ml  Net 192 ml        Physical Exam: Vital Signs Blood pressure (!) 178/92, pulse 60, temperature 98.7 F (37.1 C), temperature source Oral, resp. rate 18, height 5\' 3"  (1.6 m), SpO2 98 %. Gen: no distress, normal appearing HEENT: oral mucosa pink and moist, NCAT Cardio: Reg rate Chest: normal effort, normal rate of breathing Abd: soft, non-distended Ext: no edema Psych: pleasant, normal affect Skin: intact Neurological:  Mental Status: She is alert.  Comments: Patient is alert in no acute distress. Speech is mildly dysarthric but intelligible. Oriented x3 and follows commands. Fair awareness of deficits. Left central 7. Motor: LUE 2+ deltoid, biceps, triceps, 3/5 wrist and 1/5 hand grip, LLE 4/5 prox to distal. Senses pain and LT in all 4's. No resting tone .DTR's 1+.  Psychiatric:  Mood and Affect: Moodnormal.  Behavior: Behaviornormal.    Assessment/Plan: 1. Functional deficits which require 3+ hours per day of interdisciplinary therapy in a comprehensive inpatient rehab setting.  Physiatrist is providing close team supervision and 24 hour management of active medical problems listed below.  Physiatrist and  rehab team continue to assess barriers to discharge/monitor patient progress toward functional and medical goals  Care Tool:  Bathing    Body parts bathed by patient: Left arm,Chest,Abdomen,Front perineal area,Right upper leg,Left upper leg,Face   Body parts bathed by helper: Right arm,Buttocks,Right lower leg,Left lower leg     Bathing assist Assist Level: Moderate Assistance - Patient 50 - 74%     Upper Body Dressing/Undressing Upper body dressing   What is the patient wearing?: Hospital gown only    Upper body assist Assist Level: Maximal Assistance - Patient 25 - 49%    Lower Body Dressing/Undressing Lower body dressing      What is the patient wearing?: Incontinence brief     Lower body assist Assist for lower body dressing: Maximal Assistance - Patient 25 - 49%     Toileting Toileting    Toileting assist Assist for toileting: Moderate Assistance - Patient 50 - 74%     Transfers Chair/bed transfer  Transfers assist     Chair/bed transfer assist level: Contact Guard/Touching assist     Locomotion Ambulation   Ambulation assist      Assist level: Minimal Assistance - Patient > 75% Assistive device: No Device Max distance: 132ft   Walk 10 feet activity   Assist     Assist level: Minimal  Assistance - Patient > 75% Assistive device: No Device   Walk 50 feet activity   Assist    Assist level: Minimal Assistance - Patient > 75% Assistive device: No Device    Walk 150 feet activity   Assist Walk 150 feet activity did not occur: Safety/medical concerns (fatigue, L hemi, generalized weakness)  Assist level: Minimal Assistance - Patient > 75% Assistive device: No Device    Walk 10 feet on uneven surface  activity   Assist Walk 10 feet on uneven surfaces activity did not occur: Safety/medical concerns (fatigue, L hemi, generalized weakness)         Wheelchair     Assist Will patient use wheelchair at discharge?: No Type of  Wheelchair: Manual    Wheelchair assist level: Supervision/Verbal cueing Max wheelchair distance: 69ft    Wheelchair 50 feet with 2 turns activity    Assist        Assist Level: Minimal Assistance - Patient > 75%   Wheelchair 150 feet activity     Assist      Assist Level: Total Assistance - Patient < 25%   Blood pressure (!) 178/92, pulse 60, temperature 98.7 F (37.1 C), temperature source Oral, resp. rate 18, height 5\' 3"  (1.6 m), SpO2 98 %.  Medical Problem List and Plan: 1.Left hemiparesissecondary to rightPosterior limb internal capsule/Corona radiate as well as history of CVA 2018 -patient may shower -ELOS/Goals: 10-12 days. mod I to supervision with PT, OT, SLP -WHO LUE  Continue CIR 2. Antithrombotics: -DVT/anticoagulation:D/c Lovenox- currently ambulating >150 feet, discussed with patient and she is happy about this -antiplatelet therapy: Continue Aspirin 81 mg daily and Plavix75 mgx3 weeks then aspirin alone  3. Pain Management:Tylenol as needed 4. Mood:Provide emotional support -antipsychotic agents: N/A 5. Neuropsych: This patientiscapable of making decisions on herown behalf. 6. Skin/Wound Care:Routine skin checks. May d/c IV 7. Fluids/Electrolytes/Nutrition:Routine in and outs with follow-up chemistries 8. Hypertension. Lisinopril 20 mg daily. Monitor with increased mobility. Stopped hydralazine as per patient's preference given her concern regarding side effects. Discussed risk of stroke with uncontrolled BP.   2/24: BP continues to be elevated. Since she does not like the side effect profile of hydralazine, discussed increasing Lisinopril and she is agreeable to this.   2/25: continues to be elevated, increase Lisinopril to 40mg .  9. Hyperlipidemia. Continue Lipitor 10. Prediabetes. Hemoglobin A1c 5.6. CBGs discontinued 11. Bradycardic to 50s: monitor HR TID 12.  Morbid obesity: providing regular dietary education 13. Hypokalemia: supplement and repeat K+ 2/23 14. Hypoalbuminemia: advised high protein foods- nuts are a great source given vegan status. 15. Constipation: may use Swiss Kriss herbal supplement- she says it is with pharmacy- I have placed pharmacy consult to let them know she may use this.  2/25: magnesium citrate ordered for today 16. Disposition: I have completed FMLA paperwork and left for Becky, SW  LOS: 4 days A FACE TO FACE EVALUATION WAS PERFORMED  3/23 Carsten Carstarphen 10/22/2020, 12:13 PM

## 2020-10-22 NOTE — Progress Notes (Signed)
Physical Therapy Session Note  Patient Details  Name: Shannon Garza MRN: [id number] Date of Birth: [birth year]  Today's Date: 10/22/2020 PT Individual Time: 4098-1191 PT Individual Time Calculation (min): 40 min   Short Term Goals: Week 1:  PT Short Term Goal 1 (Week 1): pt will transfer bed<>chair with LRAD and CGA PT Short Term Goal 2 (Week 1): pt will navigate 2 steps with 2 rails and min A PT Short Term Goal 3 (Week 1): pt will ambulate 82ft with LRAD and CGA  Skilled Therapeutic Interventions/Progress Updates:   Received pt sitting in recliner, pt agreeable to therapy, and reported "cramping" pain 8/10 along R lateral ribs from "sleeping wrong" but declined any pain interventions. Session with emphasis on functional mobility/transfers, generalized strengthening, dynamic standing balance/coordination, ambulation, NMR, motor control, and improved activity tolerance. Pt transferred recliner<>WC stand<>pivot without AD and min A. Discussed having pt's sister bring in pants for pt to wear during therapy to allow pt to complete higher level functional tasks as pt currently prefers to wear a gown. Pt transported to 4W dayroom in [facility] total A for time management purposes and ambulated 125ft without AD and min A. Pt demonstrates wide BOS, decreased stride/step length, and decreased foot clearance and required cues to increase L step length and for increased cadence. Pt transferred sit<>stand on Airex with min A and worked on static standing balance with narrow BOS for 1 minute without UE support and CGA with mild sway but no LOB noted. Transitioned to perturbations standing on Airex without UE support for ~45 seconds with no LOB. Pt then performed sit<>stand on Airex x10 reps with CGA/min A for balance but reported increased R rib pain limiting activity. Stand<>pivot mat<>WC without AD and CGA and pt transported back to room in [facility] total A. Pt ambulated additional 32ft without AD and min A to recliner.  Concluded session with pt sitting in recliner, needs within reach, and chair pad alarm on. RN notified to administer pain medication and to order heating pad for pt. Therapist provided pt with temporary heat pack while k-pad is ordered.   Therapy Documentation Precautions:  Precautions Precautions: Fall Precaution Comments: L hemi Restrictions Weight Bearing Restrictions: No  Therapy/Group: Individual Therapy Martin Majestic PT, DPT   10/22/2020, 7:24 AM

## 2020-10-22 NOTE — Progress Notes (Signed)
Pt refused to wear SCD. RN educated pt on the risk of not using SCD. Pt stable at this time.

## 2020-10-22 NOTE — Progress Notes (Signed)
Patient's concerns regarding food choices with her Vegan diet were addressed today by the dietician.  She will be able to place her order by phone where she can pick some dishes that she likes to eat.  RN assessed her pain on her left abdomen.  Patient asked for Tylenol and another heat pack.  RN gave the Tylenol and heat pack.  RN asked if the pain felt like gas pain?  The patient stated, " I do think it's gas pain.  I need to have a bowel movement."   Patient refused the Magnesium Citrate due to her sister is bringing some of her herbal medication to relieve constipation.  RN informed Dr. Carlis Abbott with no new orders.

## 2020-10-22 NOTE — Progress Notes (Signed)
Pt bp 178/92 manually. RN notified charge nurse to reach out to PA on duty. Jesusita Oka, PA ordered RN to give lisinopril early. Pt expressed that she did not want to eat anything until the MD allow her to take her natural medications. RN will notify dayshift RN. Pt stable at this time. RN will continue to monitor.

## 2020-10-24 MED ORDER — HYDRALAZINE HCL 10 MG PO TABS
10.0000 mg | ORAL_TABLET | Freq: Four times a day (QID) | ORAL | Status: DC | PRN
Start: 2020-10-24 — End: 2020-10-29

## 2020-10-24 NOTE — Progress Notes (Signed)
Speech Language Pathology Daily Session Note  Patient Details  Name: Shannon Garza MRN: [id number] Date of Birth: 1966-03-08  Today's Date: 10/24/2020 SLP Individual Time: 1305-1330 SLP Individual Time Calculation (min): 25 min  Short Term Goals: Week 1: SLP Short Term Goal 1 (Week 1): Pt will complete complex problem solving tasks with min A verbal cues. SLP Short Term Goal 2 (Week 1): Pt will self-monitor and self-correct errors in problem solving tasks with supervision A verbal cues. SLP Short Term Goal 3 (Week 1): Pt will demonstrate anticipatory awareness skills listing 3 activities inwhich she will need asistance with at discharge. SLP Short Term Goal 4 (Week 1): Pt will demonstrated short term recall within tasks with supervision A verbal cues. SLP Short Term Goal 4 - Progress (Week 1): Met  Skilled Therapeutic Interventions:  Pt was seen for skilled ST targeting cognitive goals.  Upon arrival, pt was in the bathroom.  She asked for assistance appropriately to pull her brief up safely when standing up from the commode.  Pt demonstrated x1 instance of unsafe walker use when navigating tight spaces between her sink and the sofa right next to it which she was able to correct with supervision.  Pt was able to recall activities of previous therapy sessions with supervision question cues but we were not able to complete the scheduling activity started in the last treatment session due to time constraints.  Pt reports that she feels she is very close to her cognitive baseline.  Pt was left in recliner with sister at bedside and call bell within reach.  Continue per current plan of care.    Pain Pain Assessment Pain Scale: 0-10 Pain Score: 0-No pain  Therapy/Group: Individual Therapy  Page, Selinda Orion 10/24/2020, 4:25 PM

## 2020-10-24 NOTE — Progress Notes (Addendum)
Pt refused SCD. RN educated pt. Pt lovenox shots d/c. Pt on plavix PT stable at this time. RN will continue to monitor.

## 2020-10-24 NOTE — Progress Notes (Signed)
Physical Therapy Session Note  Patient Details  Name: Shannon Garza MRN: [id number] Date of Birth: 1965-09-22  Today's Date: 10/24/2020 PT Individual Time: 1030-1130 PT Individual Time Calculation (min): 60 min   Short Term Goals: Week 1:  PT Short Term Goal 1 (Week 1): pt will transfer bed<>chair with LRAD and CGA PT Short Term Goal 2 (Week 1): pt will navigate 2 steps with 2 rails and min A PT Short Term Goal 3 (Week 1): pt will ambulate 59ft with LRAD and CGA  Skilled Therapeutic Interventions/Progress Updates:    Pt received seated in recliner in room, agreeable to PT session. No complaints of pain. Sit to stand with CGA and no AD throughout session. Ambulation 2 x 100 ft, 1 x 180 ft with no AD and CGA, pt focusing on heel/toe gait pattern during ambulation. Ascend/descend 4 x 6" stairs with 2 handrails and min A for balance, x 2 reps with seated rest break in between, step-to gait pattern. Pt requires cues to attend to LUE with all transfers and mobility. Standing balance on wedge with CGA and intermittent UE support on table while creating large, foam lego designs with focus on use of LUE>RUE. Pt struggles with grasp and release with LUE as well as supination/pronation and requires assist from RUE to complete pattern. LUE also fatigues quickly with task. Pt left seated in recliner in room with needs in reach, chair alarm in place at end of session.  Therapy Documentation Precautions:  Precautions Precautions: Fall Precaution Comments: L hemi Restrictions Weight Bearing Restrictions: No   Therapy/Group: Individual Therapy   Peter Congo, PT, DPT  10/24/2020, 12:13 PM

## 2020-10-24 NOTE — Progress Notes (Signed)
PROGRESS NOTE   Subjective/Complaints:   Pt unhappy about BP being up to 170s systolic.  LBM las tnight x2.  Denies pain- doing "pretty good" otherwise.     ROS:  Pt denies SOB, abd pain, CP, N/V/C/D, and vision changes   Objective:   No results found. No results for input(s): WBC, HGB, HCT, PLT in the last 72 hours. No results for input(s): NA, K, CL, CO2, GLUCOSE, BUN, CREATININE, CALCIUM in the last 72 hours.  Intake/Output Summary (Last 24 hours) at 10/24/2020 1604 Last data filed at 10/24/2020 1436 Gross per 24 hour  Intake 270 ml  Output 400 ml  Net -130 ml        Physical Exam: Vital Signs Blood pressure (!) 151/67, pulse 77, temperature 98.4 F (36.9 C), temperature source Oral, resp. rate 17, height 5\' 3"  (1.6 m), SpO2 100 %.    General: awake, alert, appropriate, NAD- sitting up in chair at bedside HENT: conjugate gaze; oropharynx moist CV: regular rate; no JVD Pulmonary: CTA B/L; no W/R/R- good air movement GI: soft, NT, ND, (+)BS Psychiatric: appropriate, but quiet, a little slowed initiation noted Neurological:  Mental Status: She is alert; slightly slowed processing  Comments: Patient is alert in no acute distress. Speech is mildly dysarthric but intelligible. Oriented x3 and follows commands. Fair awareness of deficits. Left central 7. Motor: LUE 2+ deltoid, biceps, triceps, 3/5 wrist and 1/5 hand grip, LLE 4/5 prox to distal. Senses pain and LT in all 4's. No resting tone .DTR's 1+.     Assessment/Plan: 1. Functional deficits which require 3+ hours per day of interdisciplinary therapy in a comprehensive inpatient rehab setting.  Physiatrist is providing close team supervision and 24 hour management of active medical problems listed below.  Physiatrist and rehab team continue to assess barriers to discharge/monitor patient progress toward functional and medical goals  Care  Tool:  Bathing    Body parts bathed by patient: Left arm,Chest,Abdomen,Front perineal area,Right upper leg,Left upper leg,Face,Left lower leg,Right lower leg   Body parts bathed by helper: Right arm,Buttocks,Right lower leg,Left lower leg     Bathing assist Assist Level: Moderate Assistance - Patient 50 - 74%     Upper Body Dressing/Undressing Upper body dressing   What is the patient wearing?: Bra,Dress    Upper body assist Assist Level: Maximal Assistance - Patient 25 - 49%    Lower Body Dressing/Undressing Lower body dressing      What is the patient wearing?: Incontinence brief     Lower body assist Assist for lower body dressing: Maximal Assistance - Patient 25 - 49%     Toileting Toileting    Toileting assist Assist for toileting: Moderate Assistance - Patient 50 - 74%     Transfers Chair/bed transfer  Transfers assist     Chair/bed transfer assist level: Contact Guard/Touching assist     Locomotion Ambulation   Ambulation assist      Assist level: Contact Guard/Touching assist Assistive device: No Device Max distance: 176ft   Walk 10 feet activity   Assist     Assist level: Contact Guard/Touching assist Assistive device: No Device   Walk 50 feet activity  Assist    Assist level: Contact Guard/Touching assist Assistive device: No Device    Walk 150 feet activity   Assist Walk 150 feet activity did not occur: Safety/medical concerns (fatigue, L hemi, generalized weakness)  Assist level: Contact Guard/Touching assist Assistive device: No Device    Walk 10 feet on uneven surface  activity   Assist Walk 10 feet on uneven surfaces activity did not occur: Safety/medical concerns (fatigue, L hemi, generalized weakness)         Wheelchair     Assist Will patient use wheelchair at discharge?: No Type of Wheelchair: Manual    Wheelchair assist level: Supervision/Verbal cueing Max wheelchair distance: 38ft     Wheelchair 50 feet with 2 turns activity    Assist        Assist Level: Minimal Assistance - Patient > 75%   Wheelchair 150 feet activity     Assist      Assist Level: Total Assistance - Patient < 25%   Blood pressure (!) 151/67, pulse 77, temperature 98.4 F (36.9 C), temperature source Oral, resp. rate 17, height 5\' 3"  (1.6 m), SpO2 100 %.  Medical Problem List and Plan: 1.Left hemiparesissecondary to rightPosterior limb internal capsule/Corona radiate as well as history of CVA 2018 -patient may shower -ELOS/Goals: 10-12 days. mod I to supervision with PT, OT, SLP -WHO LUE  Continue CIR 2. Antithrombotics: -DVT/anticoagulation:D/c Lovenox- currently ambulating >150 feet, discussed with patient and she is happy about this -antiplatelet therapy: Continue Aspirin 81 mg daily and Plavix75 mgx3 weeks then aspirin alone  3. Pain Management:Tylenol as needed 4. Mood:Provide emotional support -antipsychotic agents: N/A 5. Neuropsych: This patientiscapable of making decisions on herown behalf. 6. Skin/Wound Care:Routine skin checks. May d/c IV 7. Fluids/Electrolytes/Nutrition:Routine in and outs with follow-up chemistries 8. Hypertension. Lisinopril 20 mg daily. Monitor with increased mobility. Stopped hydralazine as per patient's preference given her concern regarding side effects. Discussed risk of stroke with uncontrolled BP.   2/24: BP continues to be elevated. Since she does not like the side effect profile of hydralazine, discussed increasing Lisinopril and she is agreeable to this.   2/25: continues to be elevated, increase Lisinopril to 40mg .  2/27- pt wanted something for prn issues- when BP goes too high- without knowing about her concern for hydralazine, we specifically discussed this medicine- she didn't bring up concerns- added just prn. If SBP >175 or DBP >100 9. Hyperlipidemia.  Continue Lipitor 10. Prediabetes. Hemoglobin A1c 5.6. CBGs discontinued 11. Bradycardic to 50s: monitor HR TID 12. Morbid obesity: providing regular dietary education 13. Hypokalemia: supplement and repeat K+ 2/23 14. Hypoalbuminemia: advised high protein foods- nuts are a great source given vegan status. 15. Constipation: may use Swiss Kriss herbal supplement- she says it is with pharmacy- I have placed pharmacy consult to let them know she may use this.  2/25: magnesium citrate ordered for today 16. Disposition: I have completed FMLA paperwork and left for Becky, SW  LOS: 6 days A FACE TO FACE EVALUATION WAS PERFORMED  Tedi Hughson 10/24/2020, 4:04 PM

## 2020-10-24 NOTE — Progress Notes (Signed)
Occupational Therapy Session Note  Patient Details  Name: Shannon Garza MRN: [id number] Date of Birth: 1966-06-22  Today's Date: 10/24/2020 OT Individual Time: 7893-8101 and 7510-2585 OT Individual Time Calculation (min): 69 min and 40 min   Short Term Goals: Week 1:  OT Short Term Goal 1 (Week 1): Pt will complete toilet transfers ambulating with min assist OT Short Term Goal 2 (Week 1): Pt will complete LB dressing with min assist OT Short Term Goal 3 (Week 1): pt will utilize LUE as gross assist during self-care tasks   Skilled Therapeutic Interventions/Progress Updates:    Pt greeted at time of session semireclined in bed agreeable to OT sessoin wanting to take a shower. Supine > sit with HOB elevated and use of bed rails CGA and extended time, sit to stand and walked to shower with RW CGA. Posterior entry method to walk in shower d/t lip, pt states she has walk in at home with no lip to step over. Shower transfer Min A also d/t close quarters of bathroom set up. Pt perofrmed UB/LB bathing Mod overall with hand over hand for using LUE to wash R, assist to thoroughly wash buttocks and feet but pt able to partially reach. Dried off same manner and walked back to bed CGA with RW. Donned brief Max A in underwear form, able to help with RUE. Donned bra max A d/t body habitus and problem solved methods to don at home, used hemitechnique today and LUE as functional assist. Donned dress Mod A with hemitechnique and extended time before walking to sink and performing oral hygiene and grooming CS/CGA standing at sink. NMR and hand over hand for use of LUE throughout ADL. Rearranged room so pt could have recliner sitting in corner of room, stand pivot to recliner CGA with alarm on call bell in reach.  Session 2: Pt greeted at time of session sitting up in recliner with sister present in room, no c/o pain. Did not need to use bathroom. Discussed CLOF with ADL this morning with sister present in room to  keep up to date on progress, confirmed pt does have a walk in shower at home with build in "bench" and no lip to step over. Stand pivot recliner > wheelchair CGA w/ RW and transported to therapy gym. Focus on functional grasp/release pattern in LUE and standing without support to perform two handed tasks. Also reviewed NMR of L hand for [facility] and grip strength with putty. Transported back to room, stand pivot back to recliner CGA, alarm on call bell in reach.   Therapy Documentation Precautions:  Precautions Precautions: Fall Precaution Comments: L hemi Restrictions Weight Bearing Restrictions: No     Therapy/Group: Individual Therapy  Erasmo Score 10/24/2020, 7:25 AM

## 2020-10-25 LAB — CREATININE, SERUM
Creatinine, Ser: 0.84 mg/dL (ref 0.44–1.00)
GFR, Estimated: >60 mL/min (ref >60)

## 2020-10-25 NOTE — Progress Notes (Signed)
PROGRESS NOTE   Subjective/Complaints: BP improving Had BM over weekend Has no complaints this morning.  Did not like wrist splint    ROS:  Pt denies SOB, abd pain, CP, N/V/C/D, and vision changes   Objective:   No results found. No results for input(s): WBC, HGB, HCT, PLT in the last 72 hours. Recent Labs    10/25/20 0428  CREATININE 0.84    Intake/Output Summary (Last 24 hours) at 10/25/2020 1618 Last data filed at 10/25/2020 1240 Gross per 24 hour  Intake 354 ml  Output --  Net 354 ml        Physical Exam: Vital Signs Blood pressure 123/62, pulse 61, temperature (!) 97.5 F (36.4 C), temperature source Oral, resp. rate 17, height 5\' 3"  (1.6 m), SpO2 96 %. Gen: no distress, normal appearing HEENT: oral mucosa pink and moist, NCAT Cardio: Reg rate Chest: normal effort, normal rate of breathing Abd: soft, non-distended Ext: no edema Psych: pleasant, normal affect Skin: intact Neurological:  Mental Status: She is alert; slightly slowed processing  Comments: Patient is alert in no acute distress. Speech is mildly dysarthric but intelligible. Oriented x3 and follows commands. Fair awareness of deficits. Left central 7. Motor: LUE 2+ deltoid, biceps, triceps, 3/5 wrist and 1/5 hand grip, LLE 4/5 prox to distal. Senses pain and LT in all 4's. No resting tone .DTR's 1+.     Assessment/Plan: 1. Functional deficits which require 3+ hours per day of interdisciplinary therapy in a comprehensive inpatient rehab setting.  Physiatrist is providing close team supervision and 24 hour management of active medical problems listed below.  Physiatrist and rehab team continue to assess barriers to discharge/monitor patient progress toward functional and medical goals  Care Tool:  Bathing    Body parts bathed by patient: Left arm,Chest,Abdomen,Front perineal area,Right upper leg,Left upper leg,Face,Left lower  leg,Right lower leg   Body parts bathed by helper: Right arm,Buttocks,Right lower leg,Left lower leg     Bathing assist Assist Level: Moderate Assistance - Patient 50 - 74%     Upper Body Dressing/Undressing Upper body dressing   What is the patient wearing?: Dress    Upper body assist Assist Level: Contact Guard/Touching assist    Lower Body Dressing/Undressing Lower body dressing      What is the patient wearing?: Incontinence brief     Lower body assist Assist for lower body dressing: Maximal Assistance - Patient 25 - 49%     Toileting Toileting    Toileting assist Assist for toileting: Moderate Assistance - Patient 50 - 74%     Transfers Chair/bed transfer  Transfers assist     Chair/bed transfer assist level: Contact Guard/Touching assist     Locomotion Ambulation   Ambulation assist      Assist level: Contact Guard/Touching assist Assistive device: No Device Max distance: 166ft   Walk 10 feet activity   Assist     Assist level: Contact Guard/Touching assist Assistive device: No Device   Walk 50 feet activity   Assist    Assist level: Contact Guard/Touching assist Assistive device: No Device    Walk 150 feet activity   Assist Walk 150 feet activity did not  occur: Safety/medical concerns (fatigue, L hemi, generalized weakness)  Assist level: Contact Guard/Touching assist Assistive device: No Device    Walk 10 feet on uneven surface  activity   Assist Walk 10 feet on uneven surfaces activity did not occur: Safety/medical concerns (fatigue, L hemi, generalized weakness)         Wheelchair     Assist Will patient use wheelchair at discharge?: No Type of Wheelchair: Manual    Wheelchair assist level: Supervision/Verbal cueing Max wheelchair distance: 41ft    Wheelchair 50 feet with 2 turns activity    Assist        Assist Level: Minimal Assistance - Patient > 75%   Wheelchair 150 feet activity      Assist      Assist Level: Total Assistance - Patient < 25%   Blood pressure 123/62, pulse 61, temperature (!) 97.5 F (36.4 C), temperature source Oral, resp. rate 17, height 5\' 3"  (1.6 m), SpO2 96 %.  Medical Problem List and Plan: 1.Left hemiparesissecondary to rightPosterior limb internal capsule/Corona radiate as well as history of CVA 2018 -patient may shower -ELOS/Goals: 10-12 days. mod I to supervision with PT, OT, SLP -WHO LUE  Continue CIR 2. Antithrombotics: -DVT/anticoagulation:D/c Lovenox- currently ambulating >150 feet, discussed with patient and she is happy about this -antiplatelet therapy: Continue Aspirin 81 mg daily and Plavix75 mgx3 weeks then aspirin alone  3. Pain Management:Tylenol as needed 4. Mood:Provide emotional support -antipsychotic agents: N/A 5. Neuropsych: This patientiscapable of making decisions on herown behalf. 6. Skin/Wound Care:Routine skin checks. May d/c IV 7. Fluids/Electrolytes/Nutrition:Routine in and outs with follow-up chemistries 8. Hypertension. Lisinopril 20 mg daily. Monitor with increased mobility. Stopped hydralazine as per patient's preference given her concern regarding side effects. Discussed risk of stroke with uncontrolled BP.   2/24: BP continues to be elevated. Since she does not like the side effect profile of hydralazine, discussed increasing Lisinopril and she is agreeable to this.   2/25: continues to be elevated, increase Lisinopril to 40mg .  2/27- pt wanted something for prn issues- when BP goes too high- without knowing about her concern for hydralazine, we specifically discussed this medicine- she didn't bring up concerns- added just prn. If SBP >175 or DBP >100  2/28: Better controlled, continue current regimen.  9. Hyperlipidemia. Continue Lipitor 10. Prediabetes. Hemoglobin A1c 5.6. CBGs discontinued 11. Bradycardic to 50s:  monitor HR TID 12. Morbid obesity: providing regular dietary education 13. Hypokalemia: supplement and repeat K+ 2/23 14. Hypoalbuminemia: advised high protein foods- nuts are a great source given vegan status. 15. Constipation: may use [employer] Kriss herbal supplement- she says it is with pharmacy- I have placed pharmacy consult to let them know she may use this.  2/25: magnesium citrate ordered for today  2/28: resolved 16. Disposition: I have completed FMLA paperwork and left for Becky, SW  LOS: 7 days A FACE TO FACE EVALUATION WAS PERFORMED  3/25 Shannon Garza 10/25/2020, 4:18 PM

## 2020-10-25 NOTE — Progress Notes (Signed)
Physical Therapy Session Note  Patient Details  Name: Shannon Garza MRN: [id number] Date of Birth: June 01, 1966  Today's Date: 10/25/2020 PT Individual Time: 1300-1355 PT Individual Time Calculation (min): 55 min   Short Term Goals: Week 1:  PT Short Term Goal 1 (Week 1): pt will transfer bed<>chair with LRAD and CGA PT Short Term Goal 2 (Week 1): pt will navigate 2 steps with 2 rails and min A PT Short Term Goal 3 (Week 1): pt will ambulate 43ft with LRAD and CGA  Skilled Therapeutic Interventions/Progress Updates:   Received pt sitting in recliner, pt agreeable to therapy, and denied any pain during session. Session with emphasis on functional mobility/transfers, toileting, generalized strengthening, dynamic standing balance/coordination, ambulation, simulated car transfers, NMR, and improved activity tolerance. Pt reported urge to use restroom and ambulated 26ft x 2 trials with RW and CGA. Pt able to doff brief with CGA and void. Pt able to perform peri-care in front with supervision but required total A for peri-care behind due to body habitus and L hemiparesis. Donned clean brief with max A in standing and pt stood at sink and washed hands with CGA. Ambulated 46ft with RW and CGA to mini fridge to inspect new foods in fridge. Pt transported to 74M therapy gym in [facility] total A for time management purposes and performed ambulatory simulated car transfer without AD and CGA. Pt then ambulated 7ft on uneven surfaces (ramp) without AD and R UE support with CGA. Pt performed TUG without AD with average 19.6 seconds.   Trial 1: 19 seconds with 1 R lateral LOB requiring mod A to correct Trial 2: 21 seconds  Trial 3: 19 seconds  Pt educated on test results and significance indicating high fall risk and importance of using RW for safety; pt verbalized understanding. Pt ambulated 49ft without AD and CGA to BITS and performed the following activities: -Geoboard replicating patterns x 3 trials using LUE  standing for 3 minutes without UE support and CGA for balance with emphasis on fine motor control, grip strength, and shoulder ROM. Pt with difficulty gripping stylus dropping it multiple times and with dysmetria when drawing lines. -Bell cancellation test with emphasis on visual scanning, tracking, and dynamic standing balance using RUE. Attempted with LUE but pt with increased difficulty with fine motor control. Pt transported back to room in [facility] total A and requested to return to bed. Pt transferred WC<>bed stand<>pivot without AD and CGA and doffed dress sitting EOB with mod A and donned clean gown. Pt transferred sit<>supine with supervision and able to scoot to [facility] without assist. Concluded session with pt semi-reclined in bed, needs within reach, and bed alarm on.   Therapy Documentation Precautions:  Precautions Precautions: Fall Precaution Comments: L hemi Restrictions Weight Bearing Restrictions: No   Therapy/Group: Individual Therapy Martin Majestic PT, DPT   10/25/2020, 7:37 AM

## 2020-10-25 NOTE — Progress Notes (Signed)
Speech Language Pathology Weekly Progress and Session Note  Patient Details  Name: Shannon Garza MRN: [id number] Date of Birth: 1965/11/14  Beginning of progress report period: October 19, 2020 End of progress report period: October 25, 2020  Today's Date: 10/25/2020 SLP Individual Time: 5374-8270 SLP Individual Time Calculation (min): 40 min  Short Term Goals: Week 1: SLP Short Term Goal 1 (Week 1): Pt will complete complex problem solving tasks with min A verbal cues. SLP Short Term Goal 1 - Progress (Week 1): Met SLP Short Term Goal 2 (Week 1): Pt will self-monitor and self-correct errors in problem solving tasks with supervision A verbal cues. SLP Short Term Goal 2 - Progress (Week 1): Met SLP Short Term Goal 3 (Week 1): Pt will demonstrate anticipatory awareness skills listing 3 activities inwhich she will need asistance with at discharge. SLP Short Term Goal 3 - Progress (Week 1): Met SLP Short Term Goal 4 (Week 1): Pt will demonstrated short term recall within tasks with supervision A verbal cues. SLP Short Term Goal 4 - Progress (Week 1): Met    New Short Term Goals: Week 2: SLP Short Term Goal 1 (Week 2): STG=LTG due to short ELOS  Weekly Progress Updates: Pt made great progress meeting 4 out 4 goals requiring min-supervision A this reporting period. Pt demonstrated improvement in emergent/anticipatory awareness, mildly complex -complex problem solving and short term recall. Pt completed money management and scheduling task. Pt is not currently demonstrating mod I for this skills listed above and would continue to benefit from skilled ST services to maximize functional independence and reduce burden of care, possibly requiring outpatient ST services and intermittent supervision for higher level cognition.      Intensity: Minumum of 1-2 x/day, 30 to 90 minutes Frequency: 1 to 3 out of 7 days Duration/Length of Stay: 12-14 days Treatment/Interventions: Cognitive  remediation/compensation;Cueing hierarchy;Medication managment;Functional tasks;Patient/family education   Daily Session  Skilled Therapeutic Interventions: Skilled ST services focused on cognitive skills. SLP facilitated anticipatory awareness of need for assistance upon discharge (listing transporting to bathroom, bathing and using balancing especially when using a stool to get into bed and styling hair) with supervision A verbal cues. Pt demonstrated good recall of previous ST sessions. 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 SLP encouraged pt to consider other career choices, such as a different role within her company incase impairments in left hand take longer than expected to improve, noting slight deficits in awareness of impact of acute deficits. SLP also facilitated complex problem solving and error awareness in novel scheduling task, pt required supervision A verbal cues for error awareness/problem solving on first task fading to mod I on second task. Pt was left in room with call bell within reach and chair alarm set. SLP recommends to continue skilled services.     General    Pain Pain Assessment Pain Score: 0-No pain  Therapy/Group: Individual Therapy  Caisen Mangas  [facility] 10/25/2020, 11:38 AM

## 2020-10-25 NOTE — Progress Notes (Signed)
Physical Therapy Session Note  Patient Details  Name: Saree Krogh MRN: [id number] Date of Birth: 1966-07-30  Today's Date: 10/25/2020 PT Individual Time: 8616-8372 PT Individual Time Calculation (min): 32 min   Short Term Goals: Week 1:  PT Short Term Goal 1 (Week 1): pt will transfer bed<>chair with LRAD and CGA PT Short Term Goal 2 (Week 1): pt will navigate 2 steps with 2 rails and min A PT Short Term Goal 3 (Week 1): pt will ambulate 31ft with LRAD and CGA  Skilled Therapeutic Interventions/Progress Updates:    Patient standing in room upon my entry looking for her menu to order lunch.  Reminded to call for assistance and not to be up on her own.  Patient called and ordered lunch.  Performed sit to stand CGA and ambulated x 230' with RW and CGA stopped to redirect walker with cues due to R hand not pushing walker as well as L.  Performed sit to stand x 5 with 1 UE support and CGA cues for slow controlled stand to sit.  Patient negotiated 4 steps in stairwell with L rail only (sidestep technique) with min/mod A and mod cues for foot placement.  Patient ambulated 100' & 130' with RW and CGA one seated rest in chair without armrests and CGA to stand no UE support.  Patient left in recliner with chair alarm active and needs in reach.   Therapy Documentation Precautions:  Precautions Precautions: Fall Precaution Comments: L hemi Restrictions Weight Bearing Restrictions: No Pain: Pain Assessment Pain Score: 0-No pain   Therapy/Group: Individual Therapy  Elray Mcgregor  Sheran Lawless,  10/25/2020, 4:27 PM

## 2020-10-25 NOTE — Progress Notes (Signed)
Pt refused to wear TEDs and SCDs. RN educated pt. Pt states, " I have been walking daily." RN will continue to monitor.

## 2020-10-25 NOTE — Progress Notes (Signed)
Occupational Therapy Session Note  Patient Details  Name: Shannon Garza MRN: [id number] Date of Birth: 1966/07/26  Today's Date: 10/25/2020 OT Individual Time: 8250-5397 OT Individual Time Calculation (min): 43 min    Short Term Goals: Week 1:  OT Short Term Goal 1 (Week 1): Pt will complete toilet transfers ambulating with min assist OT Short Term Goal 2 (Week 1): Pt will complete LB dressing with min assist OT Short Term Goal 3 (Week 1): pt will utilize LUE as gross assist during self-care tasks  Skilled Therapeutic Interventions/Progress Updates:    Treatment session with focus on self-care retraining, functional mobility, standing tolerance, and LUE NMR.  Pt received upright seated EOB finishing breakfast.  Pt donned overhead dress seated EOB with increased time but able to complete with only setup assist.  Pt ambulated to sink with RW with CGA, engaged in oral care in standing.  Pt utilized LUE as gross assist with picking up cup and then utilized BUE when bringing cup to mouth to rinse mouth. Pt ambulated to recliner CGA with RW. Engaged in LUE NMR in sitting with focus on finger extension, abduction/adduction, and pinch/grasp.  Utilized foam block with focus on thumb opposition and pinch, progressing to use of theraputty with focus on pinch strength.  Educated on various AROM in sitting with focus on shoulder flexion, internal/external rotation, horizontal adduction to address areas of self-care tasks of bathing and dressing and functional reach. Encouraged pt to continue to focus on AROM and hand exercises between therapy sessions.  Pt remained upright in recliner with chair alarm on and all needs in reach.  Therapy Documentation Precautions:  Precautions Precautions: Fall Precaution Comments: L hemi Restrictions Weight Bearing Restrictions: No Pain:  Pt with no c/o pain   Therapy/Group: Individual Therapy  Rosalio Loud 10/25/2020, 8:50 AM

## 2020-10-26 NOTE — Consult Note (Signed)
Neuropsychological Consultation   Patient:   Shannon Garza   DOB:   12-24-1965  MR Number:  [id number]  Location:  [facility] [address] CENTER A [id number] [place] [address] Dept: [phone] Loc: [phone]           Date of Service:   10/26/2020  Start Time:   3 PM End Time:   4 PM  Provider/Observer:  Arley Phenix, Psy.D.       Clinical Neuropsychologist       Billing Code/Service: [phone]  Chief Complaint:    Shannon Garza is a 55 year old female with a history of prediabetes, CVA 2018 hypertension.  The patient had not been managing her hypertensive events and stroke risk with pharmacological interventions.  The patient presented on 10/12/2020 with left-sided weakness that first developed the day before.  Noted blood pressure systolic 163-194.  CT/MRI showed area of restricted diffusion involving posterior limb of the internal capsule on the right, extending into the corona radiata and consistent with acute/subacute infarction.  There was also a remote infarct in left basal ganglia and left-sided pons noted as well.  Reason for Service:  CZY:[id number] [name] is a 55 year old right-handed female with history ofprediabetes, CVA 2018 and hypertension. Patient opted for herbal medicine route rather than taking other medications after CVA. Per chart review patient lives with her brother sister-in-law and sister. Independent prior to admission works from home. Multilevel home bed and bath upstairs 2 steps to entry. Sister can provide assistance. Presented 10/12/2020 with left-sided weakness 10/11/2020. Noted blood pressure systolic 163-194. CT/MRI showed area of restricted diffusion involving the posterior limb of internal capsule on the right extending into the corona radiata consistent with acute/subacute infarction. Remote infarct in left basal ganglia and left side of the pons. CT angiogram of  the head and neck no hemodynamically significant stenosis in the neck. 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 Echocardiogram with ejection fraction of 60 to 65% no wall motion abnormalities. Admission chemistries urine drug screen negative potassium 3.2 glucose 126 alcohol negative hemoglobin A1c 5.6. Currently maintained on aspirin and Plavix for CVA prophylaxis x3 weeks and aspirin alone. Subcutaneous Lovenox for DVT prophylaxis. Tolerating a regular diet. Therapy evaluations completed with recommendations of physical medicine rehab consult to the left side weakness and patient was admitted for a comprehensive rehab program.  Current Status:  The patient was alert and awake and sitting upright in a chair in her room when I entered the room.  The patient was very quiet but did speak and answer questions appropriately.  The patient reports that her stroke she had in 2018 had had significant improvement and she did not have any significant residual effects.  The patient reports that she is now having some significant left-sided motor deficits primarily.  The patient reports that while she is frustrated about these residual deficits that she is seeing improvements with therapy and denies any significant mood disturbance to the point that negatively impact her full participation in therapeutic efforts.  Behavioral Observation: [name]  presents as a 55 y.o.-year-old Right African American Female who appeared her stated age. her dress was Appropriate and she was Well Groomed and her manners were Appropriate to the situation.  her participation was indicative of Appropriate and Attentive behaviors.  There were physical disabilities noted.  she displayed an appropriate level of cooperation and motivation.     Interactions:    Active Appropriate and  Attentive  Attention:   within normal limits and attention span and concentration were age appropriate  Memory:   within normal  limits; recent and remote memory intact  Visuo-spatial:  not examined  Speech (Volume):  low  Speech:   normal; normal  Thought Process:  Coherent and Relevant  Though Content:  WNL; not suicidal and not homicidal  Orientation:   person, place, time/date and situation  Judgment:   Fair  Planning:   Fair  Affect:    Appropriate  Mood:    Dysphoric  Insight:   Fair  Intelligence:   normal  Medical History:   Past Medical History:  Diagnosis Date  . Diabetes mellitus without complication (HCC)   . Hypertension          Patient Active Problem List   Diagnosis Date Noted  . CVA (cerebral vascular accident) (HCC) 10/18/2020  . Primary hypertension   . Left arm weakness   . Left leg weakness   . Subcortical infarction (HCC) 10/12/2020     Psychiatric History:  No prior psychiatric history  Family Med/Psych History: History reviewed. No pertinent family history.  Impression/DX:  Shannon Garza is a 55 year old female with a history of prediabetes, CVA 2018 hypertension.  The patient had not been managing her hypertensive events and stroke risk with pharmacological interventions.  The patient presented on 10/12/2020 with left-sided weakness that first developed the day before.  Noted blood pressure systolic 163-194.  CT/MRI showed area of restricted diffusion involving posterior limb of the internal capsule on the right, extending into the corona radiata and consistent with acute/subacute infarction.  There was also a remote infarct in left basal ganglia and left-sided pons noted as well.  The patient was alert and awake and sitting upright in a chair in her room when I entered the room.  The patient was very quiet but did speak and answer questions appropriately.  The patient reports that her stroke she had in 2018 had had significant improvement and she did not have any significant residual effects.  The patient reports that she is now having some significant left-sided  motor deficits primarily.  The patient reports that while she is frustrated about these residual deficits that she is seeing improvements with therapy and denies any significant mood disturbance to the point that negatively impact her full participation in therapeutic efforts.  Disposition/Plan:  Today we worked on coping and adjustment issues around residual effects of her right hemisphere and subcortical CVA.  Diagnosis:    Subcortical infarction [facility]) - Plan: Ambulatory referral to Neurology         Electronically Signed   _______________________ Arley Phenix, Psy.D. Clinical Neuropsychologist

## 2020-10-26 NOTE — Progress Notes (Signed)
Patient ID: Shannon Garza, female   DOB: 1965/10/20, 55 y.o.   MRN: [id number]  Met with pt to discuss team conference goals of supervision level and discharge date 3/8. Her sister will be there with her and she feels she is doing well in her therapies. Agreeable to OP therapies, have faxed referral to [facility] Neuro OP will call pt to set up appointments. Pt has access to a tub seat, will need rolling walker. Continue to work on discharge needs.

## 2020-10-26 NOTE — Progress Notes (Signed)
Occupational Therapy Weekly Progress Note  Patient Details  Name: Shannon Garza MRN: [id number] Date of Birth: Dec 11, 1965  Beginning of progress report period: October 19, 2020 End of progress report period: October 26, 2020  Today's Date: 10/26/2020 OT Individual Time: 1000-1045 OT Individual Time Calculation (min): 45 min    Patient has met 2 of 3 short term goals.  Pt is making steady progress towards goals.  Pt is able to complete functional transfers and mobility with RW with CGA.  Pt continues to require mod assist for bathing and toileting due to body habitus and LUE hemiparesis.  Pt is able to complete UB dressing with supervision, however continues to require mod-max assist for LB dressing.  Pt is demonstrating increased use of LUE during self-care tasks with loose gross grasp and increased range to engage in bathing and dressing tasks.  Pt continues to demonstrate decreased radial nerve function with thumb and first two digits.    Patient continues to demonstrate the following deficits: muscle weakness, decreased cardiorespiratoy endurance, impaired timing and sequencing, unbalanced muscle activation and decreased coordination, decreased motor planning and decreased standing balance, hemiplegia and decreased balance strategies and therefore will continue to benefit from skilled OT intervention to enhance overall performance with BADL and Reduce care partner burden.  Patient progressing toward long term goals..  Continue plan of care.  OT Short Term Goals Week 1:  OT Short Term Goal 1 (Week 1): Pt will complete toilet transfers ambulating with min assist OT Short Term Goal 1 - Progress (Week 1): Met OT Short Term Goal 2 (Week 1): Pt will complete LB dressing with min assist OT Short Term Goal 2 - Progress (Week 1): Progressing toward goal OT Short Term Goal 3 (Week 1): pt will utilize LUE as gross assist during self-care tasks OT Short Term Goal 3 - Progress (Week 1): Met Week 2:   OT Short Term Goal 1 (Week 2): Pt will complete LB dressing with min assist OT Short Term Goal 2 (Week 2): Pt will complete bathing with min assist OT Short Term Goal 3 (Week 2): Pt will complete toileting with supervision  Skilled Therapeutic Interventions/Progress Updates:    Treatment session with focus on functional use of LUE and LUE NMR.  Pt received seated EOB declining bathing and dressing this session.  Engaged in therapeutic activities with focus on increased functional use of LUE.  Utilized hula hoop in [place] with focus on L shoulder flexion and internal/external rotation.  Pt required cues to visually attend to LUE and occasional cues at shoulder to decrease compensatory movements.  Engaged in ROM across all planes with focus on functional reach while maintaining hold on small ball.  Therapist challenged pt to engage in Connect 4 activity with focus on pinch grip to pick up pieces and then shoulder flexion to reach to place in Connect 4 grid.  Pt continues to demonstrate difficulty with picking up small pieces due to decreased radial innervation and difficulty with thumb opposition.  Pt returned to supine and left semi-reclined with all needs in reach.  Therapy Documentation Precautions:  Precautions Precautions: Fall Precaution Comments: L hemi Restrictions Weight Bearing Restrictions: No General:   Vital Signs: Therapy Vitals Temp: 98.7 F (37.1 C) Temp Source: Oral Pulse Rate: (!) 54 Resp: 14 BP: (!) 144/63 Patient Position (if appropriate): Lying Oxygen Therapy SpO2: 99 % O2 Device: Room Air Pain: Pain Assessment Pain Scale: 0-10 Pain Score: 0-No pain ADL: ADL Upper Body Bathing: Minimal assistance Where Assessed-Upper Body  Bathing: Shower Lower Body Bathing: Moderate assistance Where Assessed-Lower Body Bathing: Shower Upper Body Dressing: Moderate assistance Where Assessed-Upper Body Dressing: Chair Lower Body Dressing: Maximal assistance Where Assessed-Lower  Body Dressing: Chair Toilet Transfer: Moderate assistance Toilet Transfer Method: Ambulating Tub/Shower Transfer: Moderate assistance Tub/Shower Transfer Method: Ambulating Tub/Shower Equipment: Shower seat with back,Walk in shower,Grab bars Vision   Perception    Praxis   Exercises:   Other Treatments:     Therapy/Group: Individual Therapy  Simonne Come 10/26/2020, 7:22 AM

## 2020-10-26 NOTE — Progress Notes (Signed)
Physical Therapy Weekly Progress Note  Patient Details  Name: Shannon Garza MRN: [id number] Date of Birth: [birth year]  Beginning of progress report period: October 19, 2020 End of progress report period: October 26, 2020  Today's Date: 10/26/2020 PT Individual Time: 0109-3235 PT Individual Time Calculation (min): 56 min   Patient has met 3 of 3 short term goals. Pt is making steady progress towards long term goals. Pt is currently able to perform bed mobility with supervision, transfers with and without AD and CGA, ambulate >234f with RW and CGA, and navigate 8 steps with 1 handrail and min A. Pt is limited by decreased balance/coordination and generalized weakness but overall demonstrates improvements in LUE strength/ROM.   Patient continues to demonstrate the following deficits muscle weakness, decreased cardiorespiratoy endurance, decreased coordination and decreased motor planning and decreased standing balance, decreased postural control, hemiplegia and decreased balance strategies and therefore will continue to benefit from skilled PT intervention to increase functional independence with mobility.  Patient progressing toward long term goals..  Continue plan of care.  PT Short Term Goals Week 1:  PT Short Term Goal 1 (Week 1): pt will transfer bed<>chair with LRAD and CGA PT Short Term Goal 1 - Progress (Week 1): Met PT Short Term Goal 2 (Week 1): pt will navigate 2 steps with 2 rails and min A PT Short Term Goal 2 - Progress (Week 1): Met PT Short Term Goal 3 (Week 1): pt will ambulate 55fwith LRAD and CGA PT Short Term Goal 3 - Progress (Week 1): Met Week 2:  PT Short Term Goal 1 (Week 2): STG=LTG due to LOS  Skilled Therapeutic Interventions/Progress Updates:  Ambulation/gait training;Discharge planning;Functional mobility training;Psychosocial support;Therapeutic Activities;Balance/vestibular training;Disease management/prevention;Neuromuscular re-education;Skin care/wound  management;Therapeutic Exercise;Wheelchair propulsion/positioning;DME/adaptive equipment instruction;Pain management;Splinting/orthotics;UE/LE Strength taining/ROM;Community reintegration;Functional electrical stimulation;Patient/family education;Stair training;UE/LE Coordination activities   Today's Interventions: Received pt sitting in recliner, pt agreeable to therapy, and denied any pain during session. Session with emphasis on functional mobility/transfers, generalized strengthening, dynamic standing balance/coordination, ambulation, stair navigation, and improved activity tolerance. Pt ambulated 1593fithout AD and CGA/min A and transported down to 45M therapy gym [facility] for time management purposes. Pt navigated 8 steps with 2 rails and min A ascending and descending with a step to pattern with cues for "up with the good, down with the bad" technique. Pt then reported she will be going in the front door with 1 L handrail and has 10 steps to get to her bedroom inside with 1 L handrail. Pt navigated additional 8 steps using a lateral stepping technique with L handrail and min/mod A. Pt with increased difficulty spacing feet on stairs and frequently stepping on top of her feet. Encouraged pt to speak with her sister regarding bringing in tennis shoes for therapy. Pt ambulated 150f15fth RW and CGA to dayroom. Pt continues to demonstrate decreased cadence, decreased weight shifting to L, and decreased bilateral foot clearance but no LOB noted. Pt performed BUE/LE strengthening on Nustep at workload 4 for 8 minutes for a total of 402 steps and additional 2 minutes using BLE only at workload 6 for improved cardiovascular endurance with emphasis on LUE grip strength and BLE strengthening. Pt's LUE sliding off handlebar but pt able to readjust without assist. Pt reported feeling better after exercising on the bike. [facility] and ambulated 15ft60fhout AD and CGA to recliner.  Concluded session with pt sitting in recliner, needs within reach, and chair pad  alarm on.   Therapy Documentation Precautions:  Precautions Precautions: Fall Precaution Comments: L hemi Restrictions Weight Bearing Restrictions: No  Therapy/Group: Individual Therapy Alfonse Alpers PT, DPT   10/26/2020, 7:35 AM

## 2020-10-26 NOTE — Progress Notes (Signed)
PROGRESS NOTE   Subjective/Complaints: Refusing Teds and SCDs- has been walking  Systolic BP elevated to 140s.  Bradycardic  ROS:  Pt denies SOB, abd pain, CP, N/V/C/D, and vision changes, +left upper extremity weakness.    Objective:   No results found. No results for input(s): WBC, HGB, HCT, PLT in the last 72 hours. Recent Labs    10/25/20 0428  CREATININE 0.84    Intake/Output Summary (Last 24 hours) at 10/26/2020 0838 Last data filed at 10/26/2020 0817 Gross per 24 hour  Intake 476 ml  Output -  Net 476 ml        Physical Exam: Vital Signs Blood pressure (!) 144/63, pulse (!) 54, temperature 98.7 F (37.1 C), temperature source Oral, resp. rate 14, height 5\' 3"  (1.6 m), SpO2 99 %. Gen: no distress, normal appearing HEENT: oral mucosa pink and moist, NCAT Cardio: Bradycardia Chest: normal effort, normal rate of breathing Abd: soft, non-distended Ext: no edema Psych: pleasant, normal affect Skin: intact Neurological:  Mental Status: She is alert; slightly slowed processing  Comments: Patient is alert in no acute distress. Speech is mildly dysarthric but intelligible. Oriented x3 and follows commands. Fair awareness of deficits. Left central 7. Motor: LUE 2+ deltoid, biceps, triceps, 3/5 wrist and 1/5 hand grip, LLE 4/5 prox to distal. Senses pain and LT in all 4's. No resting tone .DTR's 1+.     Assessment/Plan: 1. Functional deficits which require 3+ hours per day of interdisciplinary therapy in a comprehensive inpatient rehab setting.  Physiatrist is providing close team supervision and 24 hour management of active medical problems listed below.  Physiatrist and rehab team continue to assess barriers to discharge/monitor patient progress toward functional and medical goals  Care Tool:  Bathing    Body parts bathed by patient: Left arm,Chest,Abdomen,Front perineal area,Right upper leg,Left  upper leg,Face,Left lower leg,Right lower leg   Body parts bathed by helper: Right arm,Buttocks,Right lower leg,Left lower leg     Bathing assist Assist Level: Moderate Assistance - Patient 50 - 74%     Upper Body Dressing/Undressing Upper body dressing   What is the patient wearing?: Dress    Upper body assist Assist Level: Contact Guard/Touching assist    Lower Body Dressing/Undressing Lower body dressing      What is the patient wearing?: Incontinence brief     Lower body assist Assist for lower body dressing: Maximal Assistance - Patient 25 - 49%     Toileting Toileting    Toileting assist Assist for toileting: Moderate Assistance - Patient 50 - 74%     Transfers Chair/bed transfer  Transfers assist     Chair/bed transfer assist level: Contact Guard/Touching assist     Locomotion Ambulation   Ambulation assist      Assist level: Contact Guard/Touching assist Assistive device: Walker-rolling Max distance: 200'   Walk 10 feet activity   Assist     Assist level: Contact Guard/Touching assist Assistive device: Walker-rolling   Walk 50 feet activity   Assist    Assist level: Contact Guard/Touching assist Assistive device: Walker-rolling    Walk 150 feet activity   Assist Walk 150 feet activity did not occur: Safety/medical  concerns (fatigue, L hemi, generalized weakness)  Assist level: Contact Guard/Touching assist Assistive device: Walker-rolling    Walk 10 feet on uneven surface  activity   Assist Walk 10 feet on uneven surfaces activity did not occur: Safety/medical concerns (fatigue, L hemi, generalized weakness)         Wheelchair     Assist Will patient use wheelchair at discharge?: No Type of Wheelchair: Manual    Wheelchair assist level: Supervision/Verbal cueing Max wheelchair distance: 10ft    Wheelchair 50 feet with 2 turns activity    Assist        Assist Level: Minimal Assistance - Patient > 75%    Wheelchair 150 feet activity     Assist      Assist Level: Total Assistance - Patient < 25%   Blood pressure (!) 144/63, pulse (!) 54, temperature 98.7 F (37.1 C), temperature source Oral, resp. rate 14, height 5\' 3"  (1.6 m), SpO2 99 %.  Medical Problem List and Plan: 1.Left hemiparesissecondary to rightPosterior limb internal capsule/Corona radiate as well as history of CVA 2018 -patient may shower -ELOS/Goals: 10-12 days. mod I to supervision with PT, OT, SLP -WHO LUE: patient refuses, painful- encouraged strengthening exercises.   Continue CIR  -Team conference 3/1 2. Antithrombotics: -DVT/anticoagulation:D/c Lovenox- currently ambulating >150 feet, discussed with patient and she is happy about this -antiplatelet therapy: Continue Aspirin 81 mg daily and Plavix75 mgx3 weeks then aspirin alone  3. Pain Management:Tylenol as needed 4. Mood:Provide emotional support -antipsychotic agents: N/A 5. Neuropsych: This patientiscapable of making decisions on herown behalf. 6. Skin/Wound Care:Routine skin checks. May d/c IV 7. Fluids/Electrolytes/Nutrition:Routine in and outs with follow-up chemistries 8. Hypertension. Lisinopril 20 mg daily. Monitor with increased mobility. Stopped hydralazine as per patient's preference given her concern regarding side effects. Discussed risk of stroke with uncontrolled BP.   2/24: BP continues to be elevated. Since she does not like the side effect profile of hydralazine, discussed increasing Lisinopril and she is agreeable to this.   2/25: continues to be elevated, increase Lisinopril to 40mg .  2/27- pt wanted something for prn issues- when BP goes too high- without knowing about her concern for hydralazine, we specifically discussed this medicine- she didn't bring up concerns- added just prn. If SBP >175 or DBP >100  2/28-3/1: Better controlled, continue current regimen.   9. Hyperlipidemia. Continue Lipitor 10. Prediabetes. Hemoglobin A1c 5.6. CBGs discontinued 11. Bradycardic to 50s: continue to monitor HR TID 12. Morbid obesity: providing regular dietary education 13. Hypokalemia: supplement and repeat K+ 2/23 14. Hypoalbuminemia: advised high protein foods- nuts are a great source given vegan status. 15. Constipation: may use Swiss Kriss herbal supplement- she says it is with pharmacy- I have placed pharmacy consult to let them know she may use this.  2/25: magnesium citrate ordered for today  2/28: resolved 16. Disposition: I have completed FMLA paperwork and left for Becky, SW  LOS: 8 days A FACE TO FACE EVALUATION WAS PERFORMED  3/25 Raulkar 10/26/2020, 8:38 AM

## 2020-10-26 NOTE — Progress Notes (Signed)
Physical Therapy Session Note  Patient Details  Name: Shannon Garza MRN: [id number] Date of Birth: 03-Aug-1966  Today's Date: 10/26/2020 PT Individual Time: 0867-6195 PT Individual Time Calculation (min): 45 min   Short Term Goals: Week 2:  PT Short Term Goal 1 (Week 2): STG=LTG due to LOS  Skilled Therapeutic Interventions/Progress Updates:  Patient received sitting edge of bed, MD present, agreeable to PT. She denies pain. Patient able to transfer to wc via stand pivot with CGA. PT transporting patient to therapy gym for time management and energy conservation. She was able to negotiate stairs x12 with B HR and MinA. Intermittent posterior lean when descending stairs requiring verbal cues to bring R UE forward to assist in neutral weight shift. Patient completing Sharlene Motts balance scale scoring a 44/56 indicating increased risk for falling. Discussed findings with patient and she verbalized understanding. Patient returning to room in wc, ambulatory transfer back to bed with CGA no AD. She remained sitting edge of bed, bed alarm on, call light within reach.   Therapy Documentation Precautions:  Precautions Precautions: Fall Precaution Comments: L hemi Restrictions Weight Bearing Restrictions: No    Therapy/Group: Individual Therapy  Westley Foots 10/26/2020, 7:38 AM

## 2020-10-26 NOTE — Patient Care Conference (Signed)
Inpatient RehabilitationTeam Conference and Plan of Care Update Date: 10/26/2020   Time: 09:36 AM   Patient Name: Shannon Garza      Medical Record Number: [id number]  Date of Birth: [birth year] Sex: Female         Room/Bed: 5C08C/5C08C-01 Payor Info: Payor: BLUE CROSS BLUE SHIELD / Plan: BCBS COMM PPO / Product Type: *No Product type* /    Admit Date/Time:  10/18/2020  2:27 PM  Primary Diagnosis:  Subcortical infarction [facility])  Hospital Problems: Principal Problem:   Subcortical infarction [facility]) Active Problems:   CVA (cerebral vascular accident) [facility])    Expected Discharge Date: Expected Discharge Date: 11/02/20  Team Members Present: Physician leading conference: Dr. Sula Soda Care Coodinator Present: Kennyth Arnold, RN, BSN, CRRN;Becky Dupree, LCSW Nurse Present: Greta Doom, RN PT Present: Raechel Chute, PT OT Present: Rosalio Loud, OT SLP Present: Colin Benton, SLP PPS Coordinator present : Fae Pippin, SLP     Current Status/Progress Goal Weekly Team Focus  Bowel/Bladder   continent of B/B LBM 2/28  Pt will remain continent of b/b  timed toileting, PRN laxatives   Swallow/Nutrition/ Hydration             ADL's   CGA to supervision transfers with RW and dynamic standing balance, mod assist bathing and LB dressing, Supervision UB dressing  Supervision  ADL retraining, functional transfers, dynamic standing balance, LUE NMR   Mobility   bed mobility supervision, transfers without AD CGA, gait >151ft without AD min A with RW CGA, 4 steps 2 rails min/mod A.  supervision  functional mobility/transfers, generalized strengthening, dynamic standing balance/coordination, ambulation, stair navigation, endurance.   Communication             Safety/Cognition/ Behavioral Observations  Supervision A  Mod I  complex problem solving (medication management), recall and anticipatory awareness   Pain   Pt denies pain  Pt pain level will remain <2  assess pain, PRN meds    Skin   Pt has no skin issues, previous Ecchymosis BUE  Pt will remain free of new skin breakdown  assess skin per shift, educate pt on skin breakdown prevention     Discharge Planning:  Plan for sister to assist, brother works and is there in the evenings. Pt continues to want to take herbal supplements instead of BP meds   Team Discussion: Uses herbal supplement from home for constipation, systolic BP elevated, increased Lisinopril. Continent B/B. LBM 10/25/20. Discharging home with sister. Patient on target to meet rehab goals: Supervision for bed mobility, ambulated >150 ft. Will need RW and recommending outpatient therapy. Contact guard to mod assist for lower body, using left hand more for lower body self care. Working of higher level problem solving and memory with supervision goals for SLP.  *See Care Plan and progress notes for long and short-term goals.   Revisions to Treatment Plan:  Increased Lisinopril to 40 mg.  Teaching Needs: Family education, medication management, skin/wound care, transfer training, gait training, endurance training, stair training, safety awareness.  Current Barriers to Discharge: Inaccessible home environment, Decreased caregiver support, Home enviroment access/layout, Lack of/limited family support, Weight, Medication compliance, Behavior and Nutritional means  Possible Resolutions to Barriers: Continue current medications, offer proper nutritional supplementation, provide emotional support.     Medical Summary Current Status: left upper extremity strength is improving, systolic BP is elevated, constipation has resolved  Barriers to Discharge: Medical stability;Neurogenic Bowel & Bladder;Home enviroment access/layout  Barriers to Discharge Comments: Systolic BP elevated, bradycardia,  has stairs at home and has not yet attempted these Possible Resolutions to [employer] Focus: continue lisinopril 40mg , education regarding dietary modifications,  constipation has resolved   Continued Need for Acute Rehabilitation Level of Care: The patient requires daily medical management by a physician with specialized training in physical medicine and rehabilitation for the following reasons: Direction of a multidisciplinary physical rehabilitation program to maximize functional independence : Yes Medical management of patient stability for increased activity during participation in an intensive rehabilitation regime.: Yes Analysis of laboratory values and/or radiology reports with any subsequent need for medication adjustment and/or medical intervention. : Yes   I attest that I was present, lead the team conference, and concur with the assessment and plan of the team.   10/26/2020, 1:51 PM

## 2020-10-26 NOTE — Plan of Care (Signed)
  Problem: Consults Goal: RH STROKE PATIENT EDUCATION Description: See Patient Education module for education specifics  Outcome: Progressing   Problem: RH BLADDER ELIMINATION Goal: RH STG MANAGE BLADDER WITH ASSISTANCE Description: STG Manage Bladder With Mod I Assistance Outcome: Progressing   Problem: RH SKIN INTEGRITY Goal: RH STG MAINTAIN SKIN INTEGRITY WITH ASSISTANCE Description: STG Maintain Skin Integrity With Mod I Assistance. Outcome: Progressing Goal: RH STG ABLE TO PERFORM INCISION/WOUND CARE W/ASSISTANCE Description: STG Able To Perform Incision/Wound Care With Mod I Assistance. Outcome: Progressing   Problem: RH SAFETY Goal: RH STG ADHERE TO SAFETY PRECAUTIONS W/ASSISTANCE/DEVICE Description: STG Adhere to Safety Precautions With Mod I Assistance/Device. Outcome: Progressing   Problem: RH KNOWLEDGE DEFICIT Goal: RH STG INCREASE KNOWLEDGE OF HYPERTENSION Description: Patient will demonstrate knowledge of HTN medications, dietary restrictions, BP parameters with educational materials and handouts provided by staff. Outcome: Progressing Goal: RH STG INCREASE KNOWLEDGE OF STROKE PROPHYLAXIS Description: Patient will demonstrate knowledge of medications used to prevent future strokes with educational materials and handouts provided by staff. Outcome: Progressing   Problem: RH KNOWLEDGE DEFICIT Goal: RH STG INCREASE KNOWLEDGE OF DIABETES Description: Patient will demonstrate knowledge of DM medications, dietary restrictions, BP parameters with educational materials and handouts provided by staff. Outcome: Progressing Goal: RH STG INCREASE KNOWLEGDE OF HYPERLIPIDEMIA Description: Patient will demonstrate knowledge of HLD medications, dietary restrictions, BP parameters with educational materials and handouts provided by staff. Outcome: Progressing   Problem: Consults Goal: RH STROKE PATIENT EDUCATION Description: See Patient Education module for education specifics   Outcome: Progressing

## 2020-10-26 NOTE — Progress Notes (Signed)
Speech Language Pathology Daily Session Note  Patient Details  Name: Shannon Garza MRN: [id number] Date of Birth: 06-Dec-1965  Today's Date: 10/26/2020 SLP Individual Time: 4142-3953 SLP Individual Time Calculation (min): 42 min  Short Term Goals: Week 2: SLP Short Term Goal 1 (Week 2): STG=LTG due to short ELOS  Skilled Therapeutic Interventions:Skilled ST services focused on cognitive skills. Pt directed care with assist for breakfast set up mod I. SLP facilitated medication management with BID pill organizer. Pt demonstrated verbal problem solving for medication consumed 1-4x a day with supervision A verbal cues. Pt completed BID pill organizer with supervision A verbal cues x1 error and x1 initially continuing to refill pill organizer with extra pills. Pt was left in room with call bell within reach and bed alarm set. SLP recommends to continue skilled services.      Pain Pain Assessment Pain Scale: 0-10 Pain Score: 0-No pain  Therapy/Group: Individual Therapy  Derrico Zhong  [facility] 10/26/2020, 7:56 AM

## 2020-10-27 NOTE — Progress Notes (Signed)
Occupational Therapy Session Note  Patient Details  Name: Shannon Garza MRN: [id number] Date of Birth: [birth year]  Today's Date: 10/27/2020 OT Individual Time: 6659-9357 OT Individual Time Calculation (min): 57 min    Short Term Goals: Week 2:  OT Short Term Goal 1 (Week 2): Pt will complete LB dressing with min assist OT Short Term Goal 2 (Week 2): Pt will complete bathing with min assist OT Short Term Goal 3 (Week 2): Pt will complete toileting with supervision  Skilled Therapeutic Interventions/Progress Updates:    Treatment session with focus on self-care retraining and functional use of LUE during bathing and dressing.  Pt received seated EOB agreeable to shower.  Pt ambulated to room shower with RW with CGA.  Pt completed transfer in to walk-in shower by stepping over threshold backwards with CGA and use of grab bars.  Pt completed bathing with focus on increased use of LUE, pt able to reach towards middle of upper arm and almost armpit with LUE but unable to wash armpit thoroughly therefore requiring assist of RUE.  Pt completed sit > stand with CGA and washed buttocks in standing while therapist providing CGA due to tight quarters in shower.  Pt ambulated back to recliner with RW with CGA to complete dressing. Pt donned undershirt and dress with increased time but no physical assistance.  Pt then able to thread BLE into underwear and then attempting to pull pants over L hip with LUE.  Pt able to grasp underwear with L hand but unable to fully pull over hip due to decreased UE strength.  Pt remained upright in recliner with chair alarm on and all needs in reach.  Therapy Documentation Precautions:  Precautions Precautions: Fall Precaution Comments: L hemi Restrictions Weight Bearing Restrictions: No Pain: Pain Assessment Pain Scale: 0-10 Pain Score: 0-No pain   Therapy/Group: Individual Therapy  Rosalio Loud 10/27/2020, 12:10 PM

## 2020-10-27 NOTE — Progress Notes (Signed)
Pt refused SCD. Education given. RN will continue to monitor. Pt stable at this time.

## 2020-10-27 NOTE — Progress Notes (Signed)
PROGRESS NOTE   Subjective/Complaints: No complaints this morning Discussed that BP is under better control She asks about her FMLA and we discussed this Strength is improving.   ROS:  Pt denies constipation, SOB, abd pain, CP, N/V/C/D, and vision changes, +left upper extremity weakness.    Objective:   No results found. No results for input(s): WBC, HGB, HCT, PLT in the last 72 hours. Recent Labs    10/25/20 0428  CREATININE 0.84    Intake/Output Summary (Last 24 hours) at 10/27/2020 1923 Last data filed at 10/27/2020 1220 Gross per 24 hour  Intake 120 ml  Output -  Net 120 ml        Physical Exam: Vital Signs Blood pressure (!) 121/37, pulse 60, temperature (!) 97.4 F (36.3 C), temperature source Oral, resp. rate 18, height 5\' 3"  (1.6 m), weight (!) 142.3 kg, SpO2 97 %.  Gen: no distress, normal appearing HEENT: oral mucosa pink and moist, NCAT Cardio: Reg rate Chest: normal effort, normal rate of breathing Abd: soft, non-distended Ext: no edema Psych: pleasant, normal affect Skin: intact Neuro: Mental Status: She is alert; slightly slowed processing  Comments: Patient is alert in no acute distress. Speech is mildly dysarthric but intelligible. Oriented x3 and follows commands. Fair awareness of deficits. Left central 7. Motor: LUE 2+ deltoid, biceps, triceps, 3/5 wrist and 1/5 hand grip, LLE 4/5 prox to distal. Senses pain and LT in all 4's. No resting tone .DTR's 1+.     Assessment/Plan: 1. Functional deficits which require 3+ hours per day of interdisciplinary therapy in a comprehensive inpatient rehab setting.  Physiatrist is providing close team supervision and 24 hour management of active medical problems listed below.  Physiatrist and rehab team continue to assess barriers to discharge/monitor patient progress toward functional and medical goals  Care Tool:  Bathing    Body parts  bathed by patient: Left arm,Chest,Abdomen,Front perineal area,Right upper leg,Left upper leg,Face,Left lower leg,Right lower leg,Buttocks   Body parts bathed by helper: Right arm     Bathing assist Assist Level: Minimal Assistance - Patient > 75%     Upper Body Dressing/Undressing Upper body dressing   What is the patient wearing?: Dress,Pull over shirt    Upper body assist Assist Level: Supervision/Verbal cueing    Lower Body Dressing/Undressing Lower body dressing      What is the patient wearing?: Underwear/pull up     Lower body assist Assist for lower body dressing: Minimal Assistance - Patient > 75%     Toileting Toileting    Toileting assist Assist for toileting: Moderate Assistance - Patient 50 - 74%     Transfers Chair/bed transfer  Transfers assist     Chair/bed transfer assist level: Supervision/Verbal cueing     Locomotion Ambulation   Ambulation assist      Assist level: Supervision/Verbal cueing Assistive device: Walker-rolling Max distance: 150ft   Walk 10 feet activity   Assist     Assist level: Supervision/Verbal cueing Assistive device: Walker-rolling   Walk 50 feet activity   Assist    Assist level: Supervision/Verbal cueing Assistive device: Walker-rolling    Walk 150 feet activity   Assist Walk 150  feet activity did not occur: Safety/medical concerns (fatigue, L hemi, generalized weakness)  Assist level: Supervision/Verbal cueing Assistive device: Walker-rolling    Walk 10 feet on uneven surface  activity   Assist Walk 10 feet on uneven surfaces activity did not occur: Safety/medical concerns (fatigue, L hemi, generalized weakness)         Wheelchair     Assist Will patient use wheelchair at discharge?: No Type of Wheelchair: Manual    Wheelchair assist level: Supervision/Verbal cueing Max wheelchair distance: 76ft    Wheelchair 50 feet with 2 turns activity    Assist        Assist  Level: Minimal Assistance - Patient > 75%   Wheelchair 150 feet activity     Assist      Assist Level: Total Assistance - Patient < 25%   Blood pressure (!) 121/37, pulse 60, temperature (!) 97.4 F (36.3 C), temperature source Oral, resp. rate 18, height 5\' 3"  (1.6 m), weight (!) 142.3 kg, SpO2 97 %.  Medical Problem List and Plan: 1.Left hemiparesissecondary to rightPosterior limb internal capsule/Corona radiate as well as history of CVA 2018 -patient may shower -ELOS/Goals: 10-12 days. mod I to supervision with PT, OT, SLP -WHO LUE: patient refuses, painful- encouraged strengthening exercises.   Continue CIR  -Team conference 3/1 2. Antithrombotics: -DVT/anticoagulation:D/c Lovenox- currently ambulating >150 feet, discussed with patient and she is happy about this -antiplatelet therapy: Conitnue Aspirin 81 mg daily and Plavix75 mgx3 weeks then aspirin alone  3. Pain Management:Tylenol as needed 4. Mood:Provide emotional support -antipsychotic agents: N/A 5. Neuropsych: This patientiscapable of making decisions on herown behalf. 6. Skin/Wound Care:Routine skin checks. May d/c IV 7. Fluids/Electrolytes/Nutrition:Routine in and outs with follow-up chemistries 8. Hypertension. Lisinopril 20 mg daily. Monitor with increased mobility. Stopped hydralazine as per patient's preference given her concern regarding side effects. Discussed risk of stroke with uncontrolled BP.   2/24: BP continues to be elevated. Since she does not like the side effect profile of hydralazine, discussed increasing Lisinopril and she is agreeable to this.   2/25: continues to be elevated, increase Lisinopril to 40mg .  2/27- pt wanted something for prn issues- when BP goes too high- without knowing about her concern for hydralazine, we specifically discussed this medicine- she didn't bring up concerns- added just prn. If SBP >175 or DBP  >100  2/28-3/2: Better controlled, continue current regimen.  9. Hyperlipidemia. Continue Lipitor 10. Prediabetes. Hemoglobin A1c 5.6. CBGs discontinued 11. Bradycardic to 50s: continue to monitor HR TID 12. Morbid obesity: providing regular dietary education 13. Hypokalemia: supplement and repeat K+ 2/23 14. Hypoalbuminemia: advised high protein foods- nuts are a great source given vegan status. Consulted dietary and they have provided these for her.  15. Constipation: may use [employer] Kr[employer] herbal supplement- she says it is with pharmacy- I have placed pharmacy consult to let them know she may use this.  2/25: magnesium citrate ordered for today  2/28: resolved 16. Disposition: I have completed FMLA paperwork and left for Becky, SW. Discussed with patient goal of return to work in mid-May.   LOS: 9 days A FACE TO FACE EVALUATION WAS PERFORMED  3/28 Raulkar 10/27/2020, 7:23 PM

## 2020-10-27 NOTE — Progress Notes (Signed)
Physical Therapy Session Note  Patient Details  Name: Shannon Garza MRN: [id number] Date of Birth: Oct 18, 1965  Today's Date: 10/27/2020 PT Individual Time: 6659-9357 PT Individual Time Calculation (min): 54 min   Short Term Goals: Week 1:  PT Short Term Goal 1 (Week 1): pt will transfer bed<>chair with LRAD and CGA PT Short Term Goal 1 - Progress (Week 1): Met PT Short Term Goal 2 (Week 1): pt will navigate 2 steps with 2 rails and min A PT Short Term Goal 2 - Progress (Week 1): Met PT Short Term Goal 3 (Week 1): pt will ambulate 69f with LRAD and CGA PT Short Term Goal 3 - Progress (Week 1): Met Week 2:  PT Short Term Goal 1 (Week 2): STG=LTG due to LOS  Skilled Therapeutic Interventions/Progress Updates:   Received pt sitting in recliner, pt agreeable to therapy, and reported mild sharp pain in R lateral ribcage from sleeping wrong but did not state pain level and denied pain interventions. Session with emphasis on functional mobility/transfers, generalized strengthening, dynamic standing balance/coordination, NMR, ambulation, and improved activity tolerance. Pt ambulated 13fwith with RW and supervision to [facility] for time management purposes. Pt concerned that when NT took her weight earlier today that it was incorrect and requested to be re-weighed. Stand<>pivot WC<>mat with RW and supervision and pt stepped up onto scale with CGA; weight 279.9lbs. RN notified at end of session. Pt ambulated 18014fith RW and supervision. Pt demonstrated wide BOS, decreased bilateral foot clearance, and decreased trunk rotation but was able to maintain LUE grasp on RW and with no LOB noted. Pt performed alternating toe taps to 3in step 2x20 reps without UE support and min A for balance with improvements in weight shfiting and foot clearance. Worked on static tandem balance on level ground. Pt able to keep balance for 60 seconds x 2 trials with LLE in front  but only for 15 seconds x1 and 50 seconds x 1 with LLE behind with CGA/min A for balance. Worked on dynamic standing balance playing connect four x 2 trials using LUE only with emphasis on LUE grip strength, shoulder ROM, and fine motor control as well as problem solving. Pt [facility] and ambulated 59f57fth RW and supervision to recliner. Concluded session with pt sitting in recliner, needs within reach, and chair pad alarm on.   Therapy Documentation Precautions:  Precautions Precautions: Fall Precaution Comments: L hemi Restrictions Weight Bearing Restrictions: No  Therapy/Group: Individual Therapy AnnaAlfonse Alpers DPT   10/27/2020, 7:23 AM

## 2020-10-28 NOTE — Progress Notes (Signed)
Physical Therapy Session Note  Patient Details  Name: Shannon Garza MRN: [id number] Date of Birth: Aug 18, 1966  Today's Date: 10/28/2020 PT Individual Time: 4098-1191 PT Individual Time Calculation (min): 53 min   Short Term Goals: Week 1:  PT Short Term Goal 1 (Week 1): pt will transfer bed<>chair with LRAD and CGA PT Short Term Goal 1 - Progress (Week 1): Met PT Short Term Goal 2 (Week 1): pt will navigate 2 steps with 2 rails and min A PT Short Term Goal 2 - Progress (Week 1): Met PT Short Term Goal 3 (Week 1): pt will ambulate 54f with LRAD and CGA PT Short Term Goal 3 - Progress (Week 1): Met Week 2:  PT Short Term Goal 1 (Week 2): STG=LTG due to LOS  Skilled Therapeutic Interventions/Progress Updates:   Received pt sitting in recliner, pt agreeable to therapy, and denied any pain during session. Pt reports her sister informed her she actually has 15 steps to get to her bedroom upstairs. Discussed option for pt to stay in sunroom temporarily to avoid going up/down stairs; pt will consider this option. Session with emphasis on functional mobility/transfers, generalized strengthening, dynamic standing balance/coordination, gait training, stair navigation, and improved activity tolerance. Pt declined getting dressed this morning and donned second gown with min A for LUE management. Pt called sister to ask her to bring more underwear and tennis shoes to wear during therapy as pt has difficulty navigating stairs in crocs or non-skid socks. Doffed brief and donned regular underwear with min/mod A and pt ambulated 11fwithout AD and CGA to WC and transported downstairs [facility] 4W [facility] for [facility] management purposes. Pt navigated 16 steps using L handrail and lateral stepping technique with min A overall. Pt demonstrates improvements in stepping sequence and foot placement on step as well as management of LUE along railing. Pt then ambulated 3626fithout AD and CGA fading to min A.  Pt demonstrates wide BOS due to body habitus, decreased LLE foot clearance, decreased weight shifting to L, and decreased trunk rotation. Pt with increased LLE foot drag with fatigue requiring cues for heel strike and to increase L step length. Pt performed 15 sit<>stands to fatigue without UE support and CGA for balance with emphasis on LE strength and balance. Pt [facility] and pt reported urge to have BM. Pt ambulated 5ft54fthout AD and CGA to bathroom and doffed gown with min A and able to manage underwear with CGA. Concluded session with pt sitting on commode and NT aware of pt's current status.   Therapy Documentation Precautions:  Precautions Precautions: Fall Precaution Comments: L hemi Restrictions Weight Bearing Restrictions: No  Therapy/Group: Individual Therapy AnnaAlfonse Alpers DPT   10/28/2020, 7:18 AM

## 2020-10-28 NOTE — Progress Notes (Signed)
PROGRESS NOTE   Subjective/Complaints: No complaints this morning Discussed improvements in BP Sister visiting  ROS:  Pt denies constipation, SOB, abd pain, CP, N/V/C/D, and vision changes, +left upper extremity weakness.    Objective:   No results found. No results for input(s): WBC, HGB, HCT, PLT in the last 72 hours. No results for input(s): NA, K, CL, CO2, GLUCOSE, BUN, CREATININE, CALCIUM in the last 72 hours.  Intake/Output Summary (Last 24 hours) at 10/28/2020 1647 Last data filed at 10/28/2020 1330 Gross per 24 hour  Intake 360 ml  Output --  Net 360 ml        Physical Exam: Vital Signs Blood pressure 126/62, pulse 64, temperature 97.7 F (36.5 C), temperature source Oral, resp. rate 16, height 5\' 3"  (1.6 m), weight (!) 142.3 kg, SpO2 97 %.  Gen: no distress, normal appearing HEENT: oral mucosa pink and moist, NCAT Cardio: Reg rate Chest: normal effort, normal rate of breathing Abd: soft, non-distended Ext: no edema Psych: pleasant, normal affect Skin: intact Neuro: Mental Status: She is alert; slightly slowed processing  Comments: Patient is alert in no acute distress. Speech is mildly dysarthric but intelligible. Oriented x3 and follows commands. Fair awareness of deficits. Left central 7. Motor: LUE 2+ deltoid, biceps, triceps, 3/5 wrist and 1/5 hand grip, LLE 4/5 prox to distal. Senses pain and LT in all 4's. No resting tone .DTR's 1+.     Assessment/Plan: 1. Functional deficits which require 3+ hours per day of interdisciplinary therapy in a comprehensive inpatient rehab setting.  Physiatrist is providing close team supervision and 24 hour management of active medical problems listed below.  Physiatrist and rehab team continue to assess barriers to discharge/monitor patient progress toward functional and medical goals  Care Tool:  Bathing    Body parts bathed by patient: Left  arm,Chest,Abdomen,Front perineal area,Right upper leg,Left upper leg,Face,Left lower leg,Right lower leg,Buttocks   Body parts bathed by helper: Right arm     Bathing assist Assist Level: Minimal Assistance - Patient > 75%     Upper Body Dressing/Undressing Upper body dressing   What is the patient wearing?: Dress,Pull over shirt    Upper body assist Assist Level: Supervision/Verbal cueing    Lower Body Dressing/Undressing Lower body dressing      What is the patient wearing?: Underwear/pull up     Lower body assist Assist for lower body dressing: Minimal Assistance - Patient > 75%     Toileting Toileting    Toileting assist Assist for toileting: Moderate Assistance - Patient 50 - 74%     Transfers Chair/bed transfer  Transfers assist     Chair/bed transfer assist level: Supervision/Verbal cueing     Locomotion Ambulation   Ambulation assist      Assist level: Minimal Assistance - Patient > 75% Assistive device: No Device Max distance: 325ft   Walk 10 feet activity   Assist     Assist level: Minimal Assistance - Patient > 75% Assistive device: No Device   Walk 50 feet activity   Assist    Assist level: Minimal Assistance - Patient > 75% Assistive device: No Device    Walk 150 feet activity  Assist Walk 150 feet activity did not occur: Safety/medical concerns (fatigue, L hemi, generalized weakness)  Assist level: Supervision/Verbal cueing Assistive device: No Device    Walk 10 feet on uneven surface  activity   Assist Walk 10 feet on uneven surfaces activity did not occur: Safety/medical concerns (fatigue, L hemi, generalized weakness)         Wheelchair     Assist Will patient use wheelchair at discharge?: No Type of Wheelchair: Manual    Wheelchair assist level: Supervision/Verbal cueing Max wheelchair distance: 14ft    Wheelchair 50 feet with 2 turns activity    Assist        Assist Level: Minimal  Assistance - Patient > 75%   Wheelchair 150 feet activity     Assist      Assist Level: Total Assistance - Patient < 25%   Blood pressure 126/62, pulse 64, temperature 97.7 F (36.5 C), temperature source Oral, resp. rate 16, height 5\' 3"  (1.6 m), weight (!) 142.3 kg, SpO2 97 %.  Medical Problem List and Plan: 1.Left hemiparesissecondary to rightPosterior limb internal capsule/Corona radiate as well as history of CVA 2018 -patient may shower -ELOS/Goals: 10-12 days. mod I to supervision with PT, OT, SLP -WHO LUE: patient refuses, painful- encouraged strengthening exercises.   Continue CIR  -Team conference 3/1 2. Antithrombotics: -DVT/anticoagulation:D/c Lovenox- currently ambulating >150 feet, discussed with patient and she is happy about this -antiplatelet therapy: Continue Aspirin 81 mg daily and Plavix75 mgx3 weeks then aspirin alone  3. Pain Management:Denies pain.  4. Mood:Provide emotional support -antipsychotic agents: N/A 5. Neuropsych: This patientiscapable of making decisions on herown behalf. 6. Skin/Wound Care:Routine skin checks. May d/c IV 7. Fluids/Electrolytes/Nutrition:Routine in and outs with follow-up chemistries 8. Hypertension. Lisinopril 20 mg daily. Monitor with increased mobility. Stopped hydralazine as per patient's preference given her concern regarding side effects. Discussed risk of stroke with uncontrolled BP.   2/24: BP continues to be elevated. Since she does not like the side effect profile of hydralazine, discussed increasing Lisinopril and she is agreeable to this.   2/25: continues to be elevated, increase Lisinopril to 40mg .  2/27- pt wanted something for prn issues- when BP goes too high- without knowing about her concern for hydralazine, we specifically discussed this medicine- she didn't bring up concerns- added just prn. If SBP >175 or DBP >100  2/28-3/3: Better  controlled, continue current regimen.  9. Hyperlipidemia. Continue Lipitor 10. Prediabetes. Hemoglobin A1c 5.6. CBGs discontinued 11. Bradycardic to 50s: continue to monitor HR TID  3/3: resolved.  12. Morbid obesity: providing regular dietary education 13. Hypokalemia: supplement and repeat K+ 2/23 14. Hypoalbuminemia: advised high protein foods- nuts are a great source given vegan status. Consulted dietary and they have provided these for her.  15. Constipation: may use Swiss Kriss herbal supplement- she says it is with pharmacy- I have placed pharmacy consult to let them know she may use this.  2/25: magnesium citrate ordered for today  2/28: resolved 16. Disposition: I have completed FMLA paperwork- has been received. SW. Discussed with patient goal of return to work in mid-May.   LOS: 10 days A FACE TO FACE EVALUATION WAS PERFORMED  3/28 10/28/2020, 4:47 PM

## 2020-10-28 NOTE — Progress Notes (Signed)
Speech Language Pathology Daily Session Note  Patient Details  Name: Shannon Garza MRN: [id number] Date of Birth: September 08, 1965  Today's Date: 10/28/2020 SLP Individual Time: 1430-1520 SLP Individual Time Calculation (min): 50 min  Short Term Goals: Week 2: SLP Short Term Goal 1 (Week 2): STG=LTG due to short ELOS  Skilled Therapeutic Interventions:   Patient seen for skilled ST session focused on cognitive-linguistic goals. Patient demonstrated very good anticipatory awareness and ability to verbalize how she plans to achieve goals of healthier eating when discharged from hospital next week. She informed SLP that she will need to have MD, pharmacy reconcile her medications with her vitamins/natural supplements she was taking. Patient also stated that as her Dad died of enlarged heart at age 59, she was determined that she would not follow in his path. Patient will not be returning to work and will be on disability upon discharge. She declined to work on simulated scheduling task and said that it "really stresses me out". Patient and SLP in agreement to discharge after tomorrow's session due to meeting goals and likely at cognitive baseline.  Pain Pain Assessment Pain Scale: 0-10 Pain Score: 0-No pain  Therapy/Group: Individual Therapy  Angela Nevin, MA, CCC-SLP Speech Therapy

## 2020-10-28 NOTE — Progress Notes (Signed)
Occupational Therapy Session Note  Patient Details  Name: Shannon Garza MRN: [id number] Date of Birth: 08-23-1966  Today's Date: 10/28/2020 OT Individual Time: 1040-1150 OT Individual Time Calculation (min): 70 min    Short Term Goals: Week 1:  OT Short Term Goal 1 (Week 1): Pt will complete toilet transfers ambulating with min assist OT Short Term Goal 1 - Progress (Week 1): Met OT Short Term Goal 2 (Week 1): Pt will complete LB dressing with min assist OT Short Term Goal 2 - Progress (Week 1): Progressing toward goal OT Short Term Goal 3 (Week 1): pt will utilize LUE as gross assist during self-care tasks OT Short Term Goal 3 - Progress (Week 1): Met Week 2:  OT Short Term Goal 1 (Week 2): Pt will complete LB dressing with min assist OT Short Term Goal 2 (Week 2): Pt will complete bathing with min assist OT Short Term Goal 3 (Week 2): Pt will complete toileting with supervision  Skilled Therapeutic Interventions/Progress Updates:    1:1 Pt received in the recliner. Focus on session was on neuro reeducation with focus on normal patterns of movement. Pt performed functional ambulation from her room on 5 C to 4 [employer] with RW with min guard to help control RW with left hand. In the gym performed task in standing on foam wedge with ankles in planter flexion while addressing pincher grasp and release with easy level clothespins. Transitioning to catching and throwing over head a large ball bilaterally with goal of full extension of shoulder and elbow. Transitioned to throwing over hand bean bags with left hand - again with focus on normal postures and timing of grasp and release. Mirror used in different task to help reinforce normal patterns of movement. Also performed balance on Balance Board; maintaining weight in the middle. Pt performed for 2 min. Pt then ambulated back to room with min guard without RW back to her room on 5 C (over 400 feet). Left resting in recliner with visitor present.    Therapy Documentation Precautions:  Precautions Precautions: Fall Precaution Comments: L hemi Restrictions Weight Bearing Restrictions: No Pain: Pain Assessment Pain Scale: 0-10 Pain Score: 0-No pain   Therapy/Group: Individual Therapy  Willeen Cass [facility] 10/28/2020, 2:35 PM

## 2020-10-29 MED ORDER — LISINOPRIL 20 MG PO TABS
30.0000 mg | ORAL_TABLET | Freq: Every day | ORAL | Status: DC
  Administered 2020-10-30 – 2020-10-31 (×2): 30 mg via ORAL
  Filled 2020-10-29 (×3): qty 1

## 2020-10-29 NOTE — Progress Notes (Signed)
Physical Therapy Session Note  Patient Details  Name: Shannon Garza MRN: [id number] Date of Birth: 21-Dec-1965  Today's Date: 10/29/2020 PT Individual Time: 1415-1500 PT Individual Time Calculation (min): 45 min   Short Term Goals: Week 2:  PT Short Term Goal 1 (Week 2): STG=LTG due to LOS  Skilled Therapeutic Interventions/Progress Updates:    Patient in recliner in room and denies pain.  Patient sit to stand with S and ambulated per her preference with RW and min A down hallway to elevator, down elevator to therapy gym with episodes of tipping walker x 3 with min A for recovery as L hand with decreased coordination.  Overall ambulated 250'.  Seated near parallel bars for rest and demo forward lunges onto BOSU.  Patient performed with bilateral UE support in bars with mod cues for foot positioning, trunk upright, etc. X 10 reps.  Performed forward gait in parallel bars with floor ladder for reinforcing foot clearance and stride length. 2 lengths of bars with bilateral UE support, then x 2 with R only UE support, then x 2 with no UE support.  Outside bars with R HHA x about 6 lengths of the ladder with cues for L Foot clearance.  Performed x 4 with SPC with assist for sequencing cane.  Patient in parallel bars forward gait stepping over cones again for foot clearance and stride length.  Patient ambulated back to room with SPC and min Ax 220'.  Left in recliner with call bell and needs in reach.  Therapy Documentation Precautions:  Precautions Precautions: Fall Precaution Comments: L hemi Restrictions Weight Bearing Restrictions: No Pain: Pain Assessment Pain Score: 0-No pain   Therapy/Group: Individual Therapy  Elray Mcgregor  Sheran Lawless, PT 10/29/2020, 2:49 PM

## 2020-10-29 NOTE — Progress Notes (Signed)
PROGRESS NOTE   Subjective/Complaints: No complaints this morning Cutting banana for her lunch- commended her for her healthy choices! Diastolic BP soft, bradycardic.  Ready for d/c Tuesday  ROS:  Pt denies constipation, SOB, abd pain, CP, N/V/C/D, and vision changes, +left upper extremity weakness.    Objective:   No results found. No results for input(s): WBC, HGB, HCT, PLT in the last 72 hours. No results for input(s): NA, K, CL, CO2, GLUCOSE, BUN, CREATININE, CALCIUM in the last 72 hours.  Intake/Output Summary (Last 24 hours) at 10/29/2020 1317 Last data filed at 10/29/2020 0727 Gross per 24 hour  Intake 480 ml  Output --  Net 480 ml        Physical Exam: Vital Signs Blood pressure (!) 137/48, pulse (!) 54, temperature 98.9 F (37.2 C), temperature source Oral, resp. rate 17, height 5\' 3"  (1.6 m), weight (!) 142.3 kg, SpO2 99 %.  Gen: no distress, normal appearing HEENT: oral mucosa pink and moist, NCAT Cardio: Bradycardia Chest: normal effort, normal rate of breathing Abd: soft, non-distended Ext: no edema Psych: pleasant, normal affect Skin: intact Neuro: Mental Status: She is alert; slightly slowed processing  Comments: Patient is alert in no acute distress. Speech is mildly dysarthric but intelligible. Oriented x3 and follows commands. Fair awareness of deficits. Left central 7. Motor: LUE 2+ deltoid, biceps, triceps, 3/5 wrist and 1/5 hand grip, LLE 4/5 prox to distal. Senses pain and LT in all 4's. No resting tone .DTR's 1+.     Assessment/Plan: 1. Functional deficits which require 3+ hours per day of interdisciplinary therapy in a comprehensive inpatient rehab setting.  Physiatrist is providing close team supervision and 24 hour management of active medical problems listed below.  Physiatrist and rehab team continue to assess barriers to discharge/monitor patient progress toward functional  and medical goals  Care Tool:  Bathing    Body parts bathed by patient: Left arm,Chest,Abdomen,Front perineal area,Right upper leg,Left upper leg,Face,Left lower leg,Right lower leg,Buttocks   Body parts bathed by helper: Right arm     Bathing assist Assist Level: Minimal Assistance - Patient > 75%     Upper Body Dressing/Undressing Upper body dressing   What is the patient wearing?: Pull over shirt    Upper body assist Assist Level: Supervision/Verbal cueing    Lower Body Dressing/Undressing Lower body dressing      What is the patient wearing?: Underwear/pull up,Pants     Lower body assist Assist for lower body dressing: Supervision/Verbal cueing     Toileting Toileting    Toileting assist Assist for toileting: Moderate Assistance - Patient 50 - 74%     Transfers Chair/bed transfer  Transfers assist     Chair/bed transfer assist level: Supervision/Verbal cueing     Locomotion Ambulation   Ambulation assist      Assist level: Minimal Assistance - Patient > 75% Assistive device: No Device Max distance: 313ft   Walk 10 feet activity   Assist     Assist level: Minimal Assistance - Patient > 75% Assistive device: No Device   Walk 50 feet activity   Assist    Assist level: Minimal Assistance - Patient > 75% Assistive  device: No Device    Walk 150 feet activity   Assist Walk 150 feet activity did not occur: Safety/medical concerns (fatigue, L hemi, generalized weakness)  Assist level: Supervision/Verbal cueing Assistive device: No Device    Walk 10 feet on uneven surface  activity   Assist Walk 10 feet on uneven surfaces activity did not occur: Safety/medical concerns (fatigue, L hemi, generalized weakness)         Wheelchair     Assist Will patient use wheelchair at discharge?: No Type of Wheelchair: Manual    Wheelchair assist level: Supervision/Verbal cueing Max wheelchair distance: 42ft    Wheelchair 50 feet  with 2 turns activity    Assist        Assist Level: Minimal Assistance - Patient > 75%   Wheelchair 150 feet activity     Assist      Assist Level: Total Assistance - Patient < 25%   Blood pressure (!) 137/48, pulse (!) 54, temperature 98.9 F (37.2 C), temperature source Oral, resp. rate 17, height 5\' 3"  (1.6 m), weight (!) 142.3 kg, SpO2 99 %.  Medical Problem List and Plan: 1.Left hemiparesissecondary to rightPosterior limb internal capsule/Corona radiate as well as history of CVA 2018 -patient may shower -ELOS/Goals: 10-12 days. mod I to supervision with PT, OT, SLP -WHO LUE: patient refuses, painful- encouraged strengthening exercises.   Continue CIR  -Team conference 3/1 2. Antithrombotics: -DVT/anticoagulation:D/c Lovenox- currently ambulating >150 feet, discussed with patient and she is happy about this -antiplatelet therapy: Continue Aspirin 81 mg daily and Plavix75 mgx3 weeks then aspirin alone  3. Pain Management:Denies pain.  4. Mood:Provide emotional support -antipsychotic agents: N/A 5. Neuropsych: This patientiscapable of making decisions on herown behalf. 6. Skin/Wound Care:Routine skin checks. May d/c IV 7. Fluids/Electrolytes/Nutrition:Routine in and outs with follow-up chemistries 8. Hypertension. Lisinopril 20 mg daily. Monitor with increased mobility. Stopped hydralazine as per patient's preference given her concern regarding side effects. Discussed risk of stroke with uncontrolled BP.   3/4: decrease Lisinopril to 30mg  given low diastolics and bradycardia. D/c hydralazine since not requiring.  9. Hyperlipidemia. Continue Lipitor 10. Prediabetes. Hemoglobin A1c 5.6. CBGs discontinued 11. Bradycardic to 50s: continue to monitor HR TID  3/4: 54 on 3/4, decrease Lisinopril as above 12. Morbid obesity: providing regular dietary education 13. Hypokalemia: supplement and  repeat K+ 2/23 14. Hypoalbuminemia: advised high protein foods- nuts are a great source given vegan status. Consulted dietary and they have provided these for her.  15. Constipation: may use Swiss Kriss herbal supplement- she says it is with pharmacy- I have placed pharmacy consult to let them know she may use this.  2/25: magnesium citrate ordered for today  2/28: resolved 16. Disposition: I have completed FMLA paperwork- has been received. SW. Discussed with patient goal of return to work in mid-May.   LOS: 11 days A FACE TO FACE EVALUATION WAS PERFORMED  Shannon Garza P Shannon Garza 10/29/2020, 1:17 PM

## 2020-10-29 NOTE — Progress Notes (Signed)
Occupational Therapy Session Note  Patient Details  Name: Shannon Garza MRN: [id number] Date of Birth: 25-Aug-1966  Today's Date: 10/29/2020 OT Individual Time: 6384-6659 OT Individual Time Calculation (min): 60 min    Short Term Goals: Week 2:  OT Short Term Goal 1 (Week 2): Pt will complete LB dressing with min assist OT Short Term Goal 2 (Week 2): Pt will complete bathing with min assist OT Short Term Goal 3 (Week 2): Pt will complete toileting with supervision  Skilled Therapeutic Interventions/Progress Updates:    Treatment session with focus on self-care retraining and LUE NMR.  Pt received upright seated at EOB. Pt declined shower this session, but agreeable to dressing.  Pt able to obtain clothing from bag with use of LUE at diminished level to gather items.  Pt completed UB dressing with supervision and LB dressing at sit > stand level with supervision.  Pt donned socks seated on couch to allow for lower surface to increase reach towards feet.  Pt donned socks and shoes with supervision and increased time, did require assistance to fasten shoes. Pt reports having other shoes with elastic laces and will plan to wear them primarily.  Pt ambulated 300' with RW with CGA to supervision, pt ambulating on/off elevator with supervision.  Engaged in LUE NMR in sitting with use of dowel rod to facilitate increased symmetry with shoulder flexion overhead.  Utilized mirror for visual input to draw attention to LUE during reaching and ROM.  Encouraged pt to engage in shoulder shrugs and forward/backward rotation when focus on increased activation on L.  Pt ambulated back to room with RW supervision with occasional CGA and remained seated in recliner with chair alarm on and all needs in reach.  Therapy Documentation Precautions:  Precautions Precautions: Fall Precaution Comments: L hemi Restrictions Weight Bearing Restrictions: No Pain: Pain Assessment Pain Scale: 0-10 Pain Score: 0-No  pain    Therapy/Group: Individual Therapy  Rosalio Loud 10/29/2020, 12:13 PM

## 2020-10-29 NOTE — Progress Notes (Signed)
Occupational Therapy Session Note  Patient Details  Name: Shannon Garza MRN: [id number] Date of Birth: 16-Sep-1965  1110-1200 group 50 min Short Term Goals: Week 1:  OT Short Term Goal 1 (Week 1): Pt will complete toilet transfers ambulating with min assist OT Short Term Goal 1 - Progress (Week 1): Met OT Short Term Goal 2 (Week 1): Pt will complete LB dressing with min assist OT Short Term Goal 2 - Progress (Week 1): Progressing toward goal OT Short Term Goal 3 (Week 1): pt will utilize LUE as gross assist during self-care tasks OT Short Term Goal 3 - Progress (Week 1): Met  Skilled Therapeutic Interventions/Progress Updates:    Pt participated in rhythmic drumming group. Pain not reported or indicated during session. Focus of group on BUE coordination, strengthening, endurance, timing/control, activity tolerance, and social participation and engagement. Pt performs session from seated position for energy conservation. Skilled interventions included coban/large handle on L drumstick to support grasp. Warm up performed prior to exercises and UB stretching completed at end of group with demo from OT. Pt able to select preferred song to share with group. Returned pt to room at end of session. Exited session with pt seated in w/c, exit alarm on and call light in reach  Therapy Documentation Precautions:  Precautions Precautions: Fall Precaution Comments: L hemi Restrictions Weight Bearing Restrictions: No General:   Vital Signs: Therapy Vitals Temp: 98.9 F (37.2 C) Temp Source: Oral Pulse Rate: (!) 54 Resp: 17 BP: (!) 137/48 Patient Position (if appropriate): Lying Oxygen Therapy SpO2: 99 % O2 Device: Room Air Pain:   ADL: ADL Upper Body Bathing: Minimal assistance Where Assessed-Upper Body Bathing: Shower Lower Body Bathing: Moderate assistance Where Assessed-Lower Body Bathing: Shower Upper Body Dressing: Moderate assistance Where Assessed-Upper Body Dressing:  Chair Lower Body Dressing: Maximal assistance Where Assessed-Lower Body Dressing: Chair Toilet Transfer: Moderate assistance Toilet Transfer Method: Ambulating Tub/Shower Transfer: Moderate assistance Tub/Shower Transfer Method: Ambulating Tub/Shower Equipment: Shower seat with back,Walk in shower,Grab bars Vision   Perception    Praxis   Exercises:   Other Treatments:     Therapy/Group: Group Therapy  Tonny Branch 10/29/2020, 6:49 AM

## 2020-10-29 NOTE — Progress Notes (Signed)
Speech Language Pathology Discharge Summary  Patient Details  Name: Nala Kachel MRN: [id number] Date of Birth: 1966-02-10  Today's Date: 10/29/2020 SLP Individual Time: 1300-1325 SLP Individual Time Calculation (min): 25 min   Skilled Therapeutic Interventions:  Patient seen for skilled ST session with focus on discussion of stroke prevention strategies (diet, etc) as well as anticipation of needs to continue progress at home when discharged next week. Patient has been strongly encouraged to get a PCP ([place]). Patient is appropriate for discharge from SLP services at this time and is in agreement.     Patient has met 2 of 2 long term goals.  Patient to discharge at overall Modified Independent level.  Reasons goals not met: N/A   Clinical Impression/Discharge Summary: Patient has made very good progress and met her STG's and LTG's. Currently, she is modified independent for safety, awareness, functional problem solving and reasoning related to ADL's, adequate anticipatory awareness. She is likely at or close to her baseline for cognitive-linguistic functioning, however she would benefit from OP ST services for complex level problem solving if plans for future return to work. If no plans for return to work, patient's current level of cognitive functioning is adequate for safe performance of ADL's and HH SLP would not be warranted.  Care Partner:  Caregiver Able to Provide Assistance: Yes  Type of Caregiver Assistance: Physical;Cognitive  Recommendation:  24 hour supervision/assistance;Outpatient SLP;Other (comment) (Patient may benefit from OP ST services for higher level cognitive functioning if plan is for eventual return to work)  Rationale for SLP Follow Up: Maximize cognitive function and independence   Equipment: N/A   Reasons for discharge: Treatment goals met   Patient/Family Agrees with Progress Made and Goals Achieved: Yes    Sonia Baller, MA, CCC-SLP Speech Therapy

## 2020-10-30 NOTE — Progress Notes (Signed)
PROGRESS NOTE   Subjective/Complaints:  No c/os, pt has no therapy today .  No bowel or bladder issues ROS:  Pt denies constipation, SOB, abd pain, CP, N/V/C/D, and vision changes, +left upper extremity weakness.    Objective:   No results found. No results for input(s): WBC, HGB, HCT, PLT in the last 72 hours. No results for input(s): NA, K, CL, CO2, GLUCOSE, BUN, CREATININE, CALCIUM in the last 72 hours.  Intake/Output Summary (Last 24 hours) at 10/30/2020 1025 Last data filed at 10/30/2020 0900 Gross per 24 hour  Intake 480 ml  Output --  Net 480 ml        Physical Exam: Vital Signs Blood pressure (!) 149/74, pulse (!) 55, temperature 98.3 F (36.8 C), resp. rate 18, height 5\' 3"  (1.6 m), weight (!) 142.3 kg, SpO2 100 %.  Gen: no distress, normal appearing HEENT: oral mucosa pink and moist, NCAT Cardio: Bradycardia Chest: normal effort, normal rate of breathing Abd: soft, non-distended Ext: no edema Psych: pleasant, normal affect Skin: intact Neuro: Mental Status: She is alert; slightly slowed processing  Comments: Patient is alert in no acute distress. Speech is mildly dysarthric but intelligible. Oriented x3 and follows commands. Fair awareness of deficits. Left central 7. Motor: LUE 2+ deltoid, biceps, triceps, 3/5 wrist and 1/5 hand grip, LLE 4/5 prox to distal. Senses pain and LT in all 4's. No resting tone .DTR's 1+.     Assessment/Plan: 1. Functional deficits which require 3+ hours per day of interdisciplinary therapy in a comprehensive inpatient rehab setting.  Physiatrist is providing close team supervision and 24 hour management of active medical problems listed below.  Physiatrist and rehab team continue to assess barriers to discharge/monitor patient progress toward functional and medical goals  Care Tool:  Bathing    Body parts bathed by patient: Left arm,Chest,Abdomen,Front perineal  area,Right upper leg,Left upper leg,Face,Left lower leg,Right lower leg,Buttocks   Body parts bathed by helper: Right arm     Bathing assist Assist Level: Minimal Assistance - Patient > 75%     Upper Body Dressing/Undressing Upper body dressing   What is the patient wearing?: Pull over shirt    Upper body assist Assist Level: Supervision/Verbal cueing    Lower Body Dressing/Undressing Lower body dressing      What is the patient wearing?: Underwear/pull up,Pants     Lower body assist Assist for lower body dressing: Supervision/Verbal cueing     Toileting Toileting    Toileting assist Assist for toileting: Moderate Assistance - Patient 50 - 74%     Transfers Chair/bed transfer  Transfers assist     Chair/bed transfer assist level: Supervision/Verbal cueing     Locomotion Ambulation   Ambulation assist      Assist level: Minimal Assistance - Patient > 75% Assistive device: Cane-straight Max distance: 220'   Walk 10 feet activity   Assist     Assist level: Minimal Assistance - Patient > 75% Assistive device: Cane-straight   Walk 50 feet activity   Assist    Assist level: Minimal Assistance - Patient > 75% Assistive device: Cane-straight    Walk 150 feet activity   Assist Walk 150 feet  activity did not occur: Safety/medical concerns (fatigue, L hemi, generalized weakness)  Assist level: Minimal Assistance - Patient > 75% Assistive device: Cane-straight    Walk 10 feet on uneven surface  activity   Assist Walk 10 feet on uneven surfaces activity did not occur: Safety/medical concerns (fatigue, L hemi, generalized weakness)         Wheelchair     Assist Will patient use wheelchair at discharge?: No Type of Wheelchair: Manual    Wheelchair assist level: Supervision/Verbal cueing Max wheelchair distance: 32ft    Wheelchair 50 feet with 2 turns activity    Assist        Assist Level: Minimal Assistance - Patient >  75%   Wheelchair 150 feet activity     Assist      Assist Level: Total Assistance - Patient < 25%   Blood pressure (!) 149/74, pulse (!) 55, temperature 98.3 F (36.8 C), resp. rate 18, height 5\' 3"  (1.6 m), weight (!) 142.3 kg, SpO2 100 %.  Medical Problem List and Plan: 1.Left hemiparesissecondary to rightPosterior limb internal capsule/Corona radiate as well as history of CVA 2018 -patient may shower -ELOS/Goals: 10-12 days. mod I to supervision with PT, OT, SLP -WHO LUE: patient refuses, painful- encouraged strengthening exercises.   Continue CIR PT,OT  2. Antithrombotics: -DVT/anticoagulation:D/c Lovenox- currently ambulating >150 feet, discussed with patient and she is happy about this -antiplatelet therapy: Continue Aspirin 81 mg daily and Plavix75 mgx3 weeks then aspirin alone  3. Pain Management:Denies pain.  4. Mood:Provide emotional support -antipsychotic agents: N/A 5. Neuropsych: This patientiscapable of making decisions on herown behalf. 6. Skin/Wound Care:Routine skin checks. May d/c IV 7. Fluids/Electrolytes/Nutrition:Routine in and outs with follow-up chemistries 8. Hypertension. Lisinopril 20 mg daily. Monitor with increased mobility. Stopped hydralazine as per patient's preference given her concern regarding side effects. Discussed risk of stroke with uncontrolled BP.   3/4: decrease Lisinopril to 30mg  given low diastolics and bradycardia. D/c hydralazine since not requiring.  Vitals:   10/30/20 0354 10/30/20 1025  BP: (!) 149/74 (!) 150/72  Pulse: (!) 55   Resp: 18   Temp: 98.3 F (36.8 C)   SpO2: 100%   sys BP a bit up , monitor another day but may need to go back to lisinopril  40mg   9. Hyperlipidemia. Continue Lipitor 10. Prediabetes. Hemoglobin A1c 5.6. CBGs discontinued 11. Bradycardic to 50s: continue to monitor HR TID  3/4: 54 on 3/4, decrease Lisinopril as  above 12. Morbid obesity: providing regular dietary education 13. Hypokalemia: supplement and repeat K+ 2/23 14. Hypoalbuminemia: advised high protein foods- nuts are a great source given vegan status. Consulted dietary and they have provided these for her.  15. Constipation: may use [employer] herbal supplement- she says it is with pharmacy- I have placed pharmacy consult to let them know she may use this.  2/25: magnesium citrate ordered for today  2/28: resolved 16. Disposition: Dr R completed FMLA paperwork- has been received. SW. Discussed with patient goal of return to work in mid-May.   LOS: 12 days A FACE TO FACE EVALUATION WAS PERFORMED  3/25 10/30/2020, 10:25 AM

## 2020-10-31 NOTE — Discharge Summary (Signed)
Physician Discharge Summary  Patient ID: Ka Bench MRN: [id number] DOB/AGE: 1965-09-27 55 y.o.  Admit date: 10/18/2020 Discharge date: 11/02/2020  Discharge Diagnoses:  Principal Problem:   Subcortical infarction [facility]) Active Problems:   CVA (cerebral vascular accident) [facility]) DVT prophylaxis Hypertension Hyperlipidemia Morbid obesity Constipation Prediabetes CVA 2018  Discharged Condition: Stable  Significant Diagnostic Studies: CT HEAD WO CONTRAST  Result Date: 10/12/2020 CLINICAL DATA:  Neuro deficit, acute, stroke suspected. Additional history provided: Left-sided weakness since yesterday, low back pain. EXAM: CT HEAD WITHOUT CONTRAST TECHNIQUE: Contiguous axial images were obtained from the base of the skull through the vertex without intravenous contrast. COMPARISON:  No pertinent prior exams available for comparison. FINDINGS: Brain: Cerebral volume is normal for age. Chronic small-vessel infarct within the left basal ganglia and internal capsule. Small chronic lacunar infarct within the right caudate nucleus. A 3 mm focus of hypodensity in the inferior right basal ganglia likely reflects a prominent perivascular space. Advanced ill-defined hypoattenuation within the cerebral white matter is nonspecific, but most often secondary to chronic small vessel ischemia. There is no acute intracranial hemorrhage. No demarcated cortical infarct. No extra-axial fluid collection. No evidence of intracranial mass. No midline shift. Partially empty sella turcica. Vascular: No hyperdense vessel.  Atherosclerotic calcifications. Skull: Normal. Negative for fracture or focal lesion. Sinuses/Orbits: Visualized orbits show no acute finding. Trace ethmoid sinus mucosal thickening. IMPRESSION: No evidence of acute intracranial abnormality. Chronic small-vessel infarct within the left basal ganglia and internal capsule. Chronic right basal ganglia lacunar infarct. Background advanced cerebral white  matter disease, nonspecific but most often secondary to chronic small vessel ischemia. Partially empty sella turcica. This finding is very commonly incidental, but can be associated with idiopathic intracranial hypertension. Electronically Signed   By: Jackey Loge DO   On: 10/12/2020 09:57   CT ANGIO NECK W OR WO CONTRAST  Result Date: 10/13/2020 CLINICAL DATA:  Acute infarct on MRI EXAM: CT ANGIOGRAPHY HEAD AND NECK TECHNIQUE: Multidetector CT imaging of the head and neck was performed using the standard protocol during bolus administration of intravenous contrast. Multiplanar CT image reconstructions and MIPs were obtained to evaluate the vascular anatomy. Carotid stenosis measurements (when applicable) are obtained utilizing NASCET criteria, using the distal internal carotid diameter as the denominator. CONTRAST:  24mL OMNIPAQUE IOHEXOL 350 MG/ML SOLN COMPARISON:  10/12/2020 FINDINGS: CT HEAD Brain: There is no acute intracranial hemorrhage or mass effect. There is subtle low-attenuation in the right corona radiata corresponding to infarction on MRI. Chronic infarct of the left basal ganglia and adjacent white matter. Additional patchy and confluent areas of hypoattenuation in the supratentorial white likely reflect age advanced chronic microvascular ischemic changes. Ventricles are stable size. There is no extra-axial fluid collection. Vascular: No new findings. Skull: Calvarium is unremarkable. Sinuses/Orbits: No acute finding. Other: None. Review of the MIP images confirms the above findings CTA NECK Aortic arch: Great vessel origins are patent. Right carotid system: Patent. No measurable stenosis at the ICA origin. Left carotid system: Patent. No measurable stenosis at the ICA origin. Vertebral arteries: Patent and codominant. Skeleton: Mild cervical spine degenerative changes. Other neck: No significant abnormality. Upper chest: No apical lung mass. Review of the MIP images confirms the above findings  CTA HEAD Anterior circulation: Intracranial internal carotid arteries patent with atherosclerotic irregularity primarily along the cavernous segments, right greater left. Irregular noncalcified plaque is present the floor of the posterior genu. There is a 3 x 2 mm inferiorly directed aneurysm the distal supraclinoid right ICA. Anterior cerebral arteries  are patent. Hypoplastic right A1 ACA. Middle cerebral arteries are patent. There is mild atherosclerotic irregularity of distal branches. Posterior circulation: Intracranial vertebral arteries, basilar artery, and posterior cerebral arteries are patent. Venous sinuses: Patent as allowed by contrast bolus timing. Review of the MIP images confirms the above findings IMPRESSION: No acute intracranial hemorrhage. Evolving acute infarction of the corona radiata better seen on MRI. No hemodynamically significant stenosis in the neck. Right greater than left intracranial internal carotid atherosclerosis. Irregular noncalcified plaque the floor of the posterior genu. Mild atherosclerotic irregularity of distal MCA branches bilaterally. 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 Electronically Signed   By: Guadlupe Spanish M.D.   On: 10/13/2020 14:05   MR Brain Wo Contrast (neuro protocol)  Addendum Date: 10/12/2020   ADDENDUM REPORT: 10/12/2020 15:32 ADDENDUM: These results were called by telephone on 10/12/2020 at 3:30 pm to provider [facility] , who verbally acknowledged these results. Electronically Signed   By: Baldemar Lenis M.D.   On: 10/12/2020 15:32   Result Date: 10/12/2020 CLINICAL DATA:  Neuro deficit, acute, stroke suspected. EXAM: MRI HEAD WITHOUT CONTRAST TECHNIQUE: Multiplanar, multiecho pulse sequences of the brain and surrounding structures were obtained without intravenous contrast. COMPARISON:  Head CT October 12, 2020 FINDINGS: Brain: Area of restricted diffusion involving the posterior limb of internal capsule on the right  extending into the corona radiata, consistent with acute/subacute infarct. Remote infarct is seen in the left basal ganglia region and left side of the pons. Scattered confluent foci of T2 hyperintensity are seen within the white matter of the cerebral hemispheres, nonspecific, more pronounced than expected for patient's age. No hemorrhage, hydrocephalus, extra-axial collection or mass lesion. Vascular: Normal flow voids. Skull and upper cervical spine: Normal marrow signal. Sinuses/Orbits: Mucous retention cyst in the left maxillary sinus. The orbits are maintained. IMPRESSION: 1. Area of restricted diffusion involving the posterior limb of internal capsule on the right extending into the corona radiata, consistent with acute/subacute infarct. 2. Remote infarct in the left basal ganglia region and left side of the pons. 3. Scattered confluent foci of T2 hyperintensity are seen within the white matter of the cerebral hemispheres, nonspecific, more pronounced than expected for patient's age. Findings may represent chronic microvascular ischemic changes. Electronically Signed: By: Baldemar Lenis M.D. On: 10/12/2020 15:22   ECHOCARDIOGRAM COMPLETE  Result Date: 10/13/2020    ECHOCARDIOGRAM REPORT   Patient Name:   TAMANI DURNEY Date of Exam: 10/13/2020 Medical Rec #:  [id number]         Height:       63.0 in Accession #:    [id number]        Weight:       288.0 lb Date of Birth:  Mar 05, 1966          BSA:          2.258 m Patient Age:    54 years          BP:           181/67 mmHg Patient Gender: F                 HR:           54 bpm. Exam Location:  Inpatient Procedure: 2D Echo, Cardiac Doppler and Color Doppler Indications:    CVA  History:        Patient has no prior history of Echocardiogram examinations.  Stroke; Risk Factors:Hypertension, Diabetes, Dyslipidemia, Sleep                 Apnea and Morbid obesity.  Sonographer:    Lavenia Atlas Referring Phys: [phone] ANKIT NANAVATI   Sonographer Comments: Patient is morbidly obese and Technically difficult study due to poor echo windows. Image acquisition challenging due to patient body habitus. Hard to hold breast up for apical windows patient too obese!!! IMPRESSIONS  1. Left ventricular ejection fraction, by estimation, is 60 to 65%. The left ventricle has normal function. The left ventricle has no regional wall motion abnormalities. There is moderate left ventricular hypertrophy. Left ventricular diastolic parameters are indeterminate.  2. Right ventricular systolic function was not well visualized. The right ventricular size is not well visualized. Tricuspid regurgitation signal is inadequate for assessing PA pressure.  3. The mitral valve is normal in structure. No evidence of mitral valve regurgitation.  4. The aortic valve is tricuspid. Aortic valve regurgitation is not visualized. No aortic stenosis is present. FINDINGS  Left Ventricle: Left ventricular ejection fraction, by estimation, is 60 to 65%. The left ventricle has normal function. The left ventricle has no regional wall motion abnormalities. The left ventricular internal cavity size was normal in size. There is  moderate left ventricular hypertrophy. Left ventricular diastolic parameters are indeterminate. Right Ventricle: The right ventricular size is not well visualized. Right vetricular wall thickness was not well visualized. Right ventricular systolic function was not well visualized. Tricuspid regurgitation signal is inadequate for assessing PA pressure. Left Atrium: Left atrial size was normal in size. Right Atrium: Right atrial size was not well visualized. Pericardium: Trivial pericardial effusion is present. Mitral Valve: The mitral valve is normal in structure. No evidence of mitral valve regurgitation. Tricuspid Valve: The tricuspid valve is normal in structure. Tricuspid valve regurgitation is trivial. Aortic Valve: The aortic valve is tricuspid. Aortic valve  regurgitation is not visualized. No aortic stenosis is present. Pulmonic Valve: The pulmonic valve was not well visualized. Pulmonic valve regurgitation is not visualized. Aorta: The aortic root and ascending aorta are structurally normal, with no evidence of dilitation. IAS/Shunts: The interatrial septum was not well visualized.  LEFT VENTRICLE PLAX 2D LVIDd:         4.20 cm  Diastology LVIDs:         2.80 cm  LV e' medial:    6.53 cm/s LV PW:         1.50 cm  LV E/e' medial:  9.6 LV IVS:        1.40 cm  LV e' lateral:   4.68 cm/s LVOT diam:     2.30 cm  LV E/e' lateral: 13.5 LV SV:         59 LV SV Index:   26 LVOT Area:     4.15 cm  RIGHT VENTRICLE RV S prime:     5.98 cm/s LEFT ATRIUM             Index       RIGHT ATRIUM           Index LA diam:        4.80 cm 2.13 cm/m  RA Area:     22.30 cm LA Vol (A2C):   45.7 ml 20.24 ml/m RA Volume:   69.40 ml  30.74 ml/m LA Vol (A4C):   67.4 ml 29.85 ml/m LA Biplane Vol: 58.5 ml 25.91 ml/m  AORTIC VALVE LVOT Vmax:   80.30 cm/s LVOT Vmean:  48.400 cm/s  LVOT VTI:    0.142 m  AORTA Ao Root diam: 3.00 cm MITRAL VALVE MV Area (PHT): 2.48 cm    SHUNTS MV Decel Time: 306 msec    Systemic VTI:  0.14 m MV E velocity: 63.00 cm/s  Systemic Diam: 2.30 cm MV A velocity: 65.10 cm/s MV E/A ratio:  0.97 Epifanio Lescheshristopher Schumann MD Electronically signed by Epifanio Lescheshristopher Schumann MD Signature Date/Time: 10/13/2020/4:53:27 PM    Final    CT ANGIO HEAD CODE STROKE  Result Date: 10/13/2020 CLINICAL DATA:  Acute infarct on MRI EXAM: CT ANGIOGRAPHY HEAD AND NECK TECHNIQUE: Multidetector CT imaging of the head and neck was performed using the standard protocol during bolus administration of intravenous contrast. Multiplanar CT image reconstructions and MIPs were obtained to evaluate the vascular anatomy. Carotid stenosis measurements (when applicable) are obtained utilizing NASCET criteria, using the distal internal carotid diameter as the denominator. CONTRAST:  75mL OMNIPAQUE IOHEXOL 350  MG/ML SOLN COMPARISON:  10/12/2020 FINDINGS: CT HEAD Brain: There is no acute intracranial hemorrhage or mass effect. There is subtle low-attenuation in the right corona radiata corresponding to infarction on MRI. Chronic infarct of the left basal ganglia and adjacent white matter. Additional patchy and confluent areas of hypoattenuation in the supratentorial white likely reflect age advanced chronic microvascular ischemic changes. Ventricles are stable size. There is no extra-axial fluid collection. Vascular: No new findings. Skull: Calvarium is unremarkable. Sinuses/Orbits: No acute finding. Other: None. Review of the MIP images confirms the above findings CTA NECK Aortic arch: Great vessel origins are patent. Right carotid system: Patent. No measurable stenosis at the ICA origin. Left carotid system: Patent. No measurable stenosis at the ICA origin. Vertebral arteries: Patent and codominant. Skeleton: Mild cervical spine degenerative changes. Other neck: No significant abnormality. Upper chest: No apical lung mass. Review of the MIP images confirms the above findings CTA HEAD Anterior circulation: Intracranial internal carotid arteries patent with atherosclerotic irregularity primarily along the cavernous segments, right greater left. Irregular noncalcified plaque is present the floor of the posterior genu. There is a 3 x 2 mm inferiorly directed aneurysm the distal supraclinoid right ICA. Anterior cerebral arteries are patent. Hypoplastic right A1 ACA. Middle cerebral arteries are patent. There is mild atherosclerotic irregularity of distal branches. Posterior circulation: Intracranial vertebral arteries, basilar artery, and posterior cerebral arteries are patent. Venous sinuses: Patent as allowed by contrast bolus timing. Review of the MIP images confirms the above findings IMPRESSION: No acute intracranial hemorrhage. Evolving acute infarction of the corona radiata better seen on MRI. No hemodynamically  significant stenosis in the neck. Right greater than left intracranial internal carotid atherosclerosis. Irregular noncalcified plaque the floor of the posterior genu. Mild atherosclerotic irregularity of distal MCA branches bilaterally. 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 Electronically Signed   By: Guadlupe SpanishPraneil  Patel M.D.   On: 10/13/2020 14:05    Labs:  Basic Metabolic Panel: Recent Labs  Lab 11/01/20 0332  NA 139  K 3.8  CL 107  CO2 22  GLUCOSE 91  BUN 8  CREATININE 0.77  CALCIUM 9.9    CBC: No results for input(s): WBC, NEUTROABS, HGB, HCT, MCV, PLT in the last 168 hours.  CBG: No results for input(s): GLUCAP in the last 168 hours.  Family history. Positive for hypertension hyperlipidemia. Denies any colon cancer esophageal cancer or rectal cancer  Brief HPI:   Shannon Garza is a 55 y.o. right-handed female with history of prediabetes, CVA 2018 and hypertension. Patient opted for herbal  medicine route rather than taking other medications after CVA. Per chart review lives with brother and sister-in-law as well as sister. Independent prior to admission works from home. Multilevel home bed and bath upstairs. Presented 10/12/2020 with left-sided weakness of acute onset. Noted blood pressure systolic 163-194. CT/MRI showed area of restricted diffusion involving the posterior limb of internal capsule on the right extending into the corona radiata consistent with acute/subacute infarction. Remote infarct in left basal ganglia and left side of pons. CT angiogram of head and neck no hemodynamically significant stenosis in the neck. 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 Echocardiogram with ejection fraction of 60 to 65% no wall motion abnormalities. Admission chemistries urine drug screen negative potassium 3.2 glucose 126 alcohol negative hemoglobin A1c 5.6. Initially maintained on aspirin Plavix for CVA prophylaxis x3 weeks then aspirin alone. Subcutaneous  Lovenox for DVT prophylaxis. Tolerating a regular diet. Therapy evaluations completed due to patient's left-sided weakness was admitted for a comprehensive rehab program.   Hospital Course: Zenita Kister was admitted to rehab 10/18/2020 for inpatient therapies to consist of PT, ST and OT at least three hours five days a week. Past admission physiatrist, therapy team and rehab RN have worked together to provide customized collaborative inpatient rehab. Pertaining to patient's right posterior limb internal capsule corona radiata infarct as well as history of CVA 2018. He completed course of aspirin and Plavix x3 weeks and remained on aspirin alone for CVA prophylaxis. He would follow-up neurology services. Initially on Lovenox for DVT prophylaxis later discontinued is ambulating greater than 150 feet. Blood pressure controlled and managed currently on lisinopril he would need follow-up outpatient. Lipitor ongoing for hyperlipidemia. Prediabetes hemoglobin A1c 5.6 blood sugars discontinued. Morbid obesity BMI 55.57 dietary follow-up. Bouts of constipation resolved with laxative assistance.   Blood pressures were monitored on TID basis and controlled  Diabetes has been monitored with ac/hs CBG checks and SSI was use prn for tighter BS control.    Rehab course: During patient's stay in rehab weekly team conferences were held to monitor patient's progress, set goals and discuss barriers to discharge. At admission, patient required minimal assist stand pivot transfers minimal assist sit to stand min mod assist 30 feet rolling walker. Moderate assist upper body bathing minimal assist lower body bathing mod assist upper body dressing minimal assist lower body dressing  Physical exam. Blood pressure 145/90 pulse 76 temperature 97 respirations 18 oxygen saturations 100% room air Constitutional. No acute distress HEENT Head. Normocephalic and atraumatic Eyes. Pupils round and reactive to light no discharge  without nystagmus Neck. Supple nontender no JVD without thyromegaly Cardiac regular rate rhythm without extra sounds or murmur heard Abdomen. Soft nontender positive bowel sounds not rebound Respiratory effort normal no respiratory distress without wheeze Skin. Warm and dry Neurologic. Alert oriented x3 speech mildly dysarthric but intelligible. Fair awareness of deficits. Left central 7. Motor. Left upper extremity 2+/5 deltoid bicep triceps, 1/5 wrist and HI. Left lower extremity 4/5 proximal to distal. Senses pain in all fours DTRs 1+  He/She  has had improvement in activity tolerance, balance, postural control as well as ability to compensate for deficits. He/She has had improvement in functional use RUE/LUE  and RLE/LLE as well as improvement in awareness. Patient sit to stand with supervision ambulates rolling walker extended distances 250 feet. Patient performed of bilateral upper extremity support and bars with moderate cues for foot positioning trunk upright. Minimal assist upper body bathing moderate assist lower body bathing moderate  assist upper body dressing max is lower body dressing. Patient could communicate his needs. Modified independent for safety awareness functional problem solving and reasoning. Full family teaching completed plan discharged to home       Disposition: Discharge to home    Diet: Vegetarian diet  Special Instructions: No driving smoking or alcohol  Medications at discharge 1. Tylenol as needed 2. Aspirin 81 mg p.o. daily 3. Lipitor 40 mg p.o. nightly 4. Lisinopril 40 mg p.o. daily 5. MiraLAX daily hold for loose stools  30-35 minutes were spent completing discharge summary and discharge planning  Discharge Instructions     Ambulatory referral to Neurology   Complete by: As directed    An appointment is requested in approximately 4 weeks subcortical CVA   Ambulatory referral to Physical Medicine Rehab   Complete by: As directed    Moderate  complexity follow-up transitional care subcortical CVA        Follow-up Information     Raulkar, Drema Pry, MD Follow up.   Specialty: Physical Medicine and Rehabilitation Why: 12/01/20 arrive at 11 for 11:20 appointment Contact information: [address] [place] [address] [phone]         Etta Grandchild, MD Follow up on 12/21/2020.   Specialty: Internal Medicine Why: APPOINTMENT @ 2:40 PM Contact information: [address] [place] [address] [phone]                 Signed: Mcarthur Rossetti Angiulli 11/02/2020, 5:15 AM

## 2020-10-31 NOTE — Progress Notes (Signed)
Physical Therapy Discharge Summary  Patient Details  Name: Shannon Garza MRN: [id number] Date of Birth: [birth year]  Today's Date: 11/01/2020 PT Individual Time: 1975-8832 PT Individual Time Calculation (min): 38 min   Patient has met 8 of 8 long term goals due to improved activity tolerance, improved balance, improved postural control, increased strength, increased range of motion, improved awareness and improved coordination. Patient to discharge at an ambulatory level Supervision. Pt's family did not attend formal family education training, however pt verbalized and demonstrated confidence with all tasks to ensure safe D/C home. Pt verbalized understanding of recommendation for supervision level assist upon D/C.   All goals met   Recommendation:  Patient will benefit from ongoing skilled PT services in outpatient setting to continue to advance safe functional mobility, address ongoing impairments in transfers, generalized strengthening, gait training, dynamic standing balance/coordination, NMR, endurnance, and to minimize fall risk.  Equipment: RW  Reasons for discharge: treatment goals met  Patient/family agrees with progress made and goals achieved: Yes  Today's Interventions: Received pt sitting in recliner, pt agreeable to therapy, and denied any pain during session. Session with emphasis on discharge planning, functional mobility/transfers, generalized strengthening, dynamic standing balance/coordination, gait training, and improved activity tolerance. Therapist adjusted pt's new RW and pt ambulated 188f x 1 and >3534fx 1 with RW and supervision. Pt able to time getting on/off elevator and pressed buttons using LUE. Pt with occasional instances of L foot drag due to shoewear and mild fatigue (educated pt on avoiding wearing crocs upon D/C and pt in agreement) and pt required min verbal cues to readjust L handgrip on RW. Pt performed BUE/LE strengthening on Nustep at workload 5  increasing to 7 for 8 minutes. During last 2 minutes increased workload to 9 and pt performed BLE strengthening only. Total of 500 steps for improved cardiovascular endurance with emphasis on maintaining LUE grip. RPE 13 after activity. [facility] (declined walking due to fatigue) and ambulated 1580fithout AD and CGA to recliner. Concluded session with pt sitting in recliner, needs within reach, and chair pad alarm on. Pt with no further questions regarding D/C.   PT Discharge Precautions/Restrictions Precautions Precautions: Fall Precaution Comments: mild L hemi Restrictions Weight Bearing Restrictions: No Cognition Overall Cognitive Status: Within Functional Limits for tasks assessed Arousal/Alertness: Awake/alert Orientation Level: Oriented X4 Memory: Appears intact Awareness: Appears intact Problem Solving: Appears intact Safety/Judgment: Appears intact Sensation Sensation Light Touch: Appears Intact Proprioception: Appears Intact Coordination Gross Motor Movements are Fluid and Coordinated: No Fine Motor Movements are Fluid and Coordinated: No Coordination and Movement Description: mild uncoordination due to mild L hemi (UE>LE), decreased balance/posutal control, and decreased endurance Finger Nose Finger Test: WFL on RUE, decreased ROM on LUE but improvements since eval Heel Shin Test: WFL bilaterally but slow Motor  Motor Motor: Hemiplegia Motor - Skilled Clinical Observations: mild L hemi UE>LE, generalized weakness, decreased balance/postural control  Mobility Bed Mobility Bed Mobility: Rolling Right;Rolling Left;Sit to Supine;Supine to Sit Rolling Right: Independent with assistive device Rolling Left: Independent with assistive device Supine to Sit: Independent with assistive device Sit to Supine: Independent with assistive device Transfers Transfers: Sit to Stand;Stand to Sit;Stand Pivot Transfers Sit to Stand: Supervision/Verbal  cueing Stand to Sit: Supervision/Verbal cueing Stand Pivot Transfers: Supervision/Verbal cueing Stand Pivot Transfer Details: Verbal cues for safe use of DME/AE Stand Pivot Transfer Details (indicate cue type and reason): verbal cues for LUE management on RW Transfer (Assistive device): Rolling walker Locomotion  Gait Ambulation: Yes Gait Assistance: Supervision/Verbal cueing Gait Distance (Feet): 150 Feet Assistive device: Rolling walker Gait Assistance Details: Verbal cues for safe use of DME/AE Gait Assistance Details: verbal cues to readjust L handgrip on RW Gait Gait: Yes Gait Pattern: Impaired Gait Pattern: Wide base of support;Decreased stride length;Decreased step length - right;Decreased step length - left;Poor foot clearance - left;Poor foot clearance - right;Step-through pattern;Decreased dorsiflexion - left Gait velocity: decreased Stairs / Additional Locomotion Stairs: Yes Stairs Assistance: Supervision/Verbal cueing Stair Management Technique: One rail Left Number of Stairs: 16 Height of Stairs: 6 Ramp: Supervision/Verbal cueing (RW) Wheelchair Mobility Wheelchair Mobility: No  Trunk/Postural Assessment  Cervical Assessment Cervical Assessment: Within Functional Limits Thoracic Assessment Thoracic Assessment: Within Functional Limits Lumbar Assessment Lumbar Assessment: Exceptions to [facility] (posterior pelvic tilt) Postural Control Postural Control: Deficits on evaluation  Balance Balance Balance Assessed: Yes Static Sitting Balance Static Sitting - Balance Support: Feet supported;No upper extremity supported Static Sitting - Level of Assistance: 7: Independent Dynamic Sitting Balance Dynamic Sitting - Balance Support: Feet supported;No upper extremity supported Dynamic Sitting - Level of Assistance: 6: Modified independent (Device/Increase time) (increased time) Static Standing Balance Static Standing - Balance Support: Bilateral upper extremity supported  (RW) Static Standing - Level of Assistance: 5: Stand by assistance (supervision) Dynamic Standing Balance Dynamic Standing - Balance Support: Bilateral upper extremity supported (RW) Dynamic Standing - Level of Assistance: 5: Stand by assistance (supervision) Extremity Assessment  RLE Assessment RLE Assessment: Exceptions to [facility] General Strength Comments: grossly generalized to 4+/5 LLE Assessment LLE Assessment: Exceptions to [facility] General Strength Comments: grossly generalized to 4/5  Alfonse Alpers PT, DPT  10/31/2020, 12:14 PM

## 2020-11-01 ENCOUNTER — Other Ambulatory Visit: Payer: Self-pay | Admitting: Physician Assistant

## 2020-11-01 LAB — BASIC METABOLIC PANEL
Anion gap: 10 (ref 5–15)
BUN: 8 mg/dL (ref 6–20)
CO2: 22 mmol/L (ref 22–32)
Calcium: 9.9 mg/dL (ref 8.9–10.3)
Chloride: 107 mmol/L (ref 98–111)
Creatinine, Ser: 0.77 mg/dL (ref 0.44–1.00)
GFR, Estimated: >60 mL/min (ref >60)
Glucose, Bld: 91 mg/dL (ref 70–99)
Potassium: 3.8 mmol/L (ref 3.5–5.1)
Sodium: 139 mmol/L (ref 135–145)

## 2020-11-01 MED ORDER — LISINOPRIL 30 MG PO TABS: 30.0000 mg | ORAL_TABLET | Freq: Every day | ORAL | 0 refills | Status: DC

## 2020-11-01 MED ORDER — ATORVASTATIN CALCIUM 40 MG PO TABS: 40.0000 mg | ORAL_TABLET | Freq: Every day | ORAL | 0 refills | Status: DC

## 2020-11-01 MED ORDER — LISINOPRIL 40 MG PO TABS
40.0000 mg | ORAL_TABLET | Freq: Every day | ORAL | Status: DC
  Administered 2020-11-01 – 2020-11-02 (×2): 40 mg via ORAL
  Filled 2020-11-01 (×2): qty 1

## 2020-11-01 MED ORDER — LISINOPRIL 40 MG PO TABS: 40.0000 mg | ORAL_TABLET | Freq: Every day | ORAL | Status: DC

## 2020-11-01 MED ORDER — POLYETHYLENE GLYCOL 3350 17 G PO PACK: 17.0000 g | PACK | Freq: Every day | ORAL | 0 refills | Status: DC

## 2020-11-01 MED FILL — ATORVASTATIN CALCIUM 40 MG: 40 | 30 days supply | Qty: 30 | Fill #0

## 2020-11-01 NOTE — Progress Notes (Signed)
Occupational Therapy Discharge Summary  Patient Details  Name: Shannon Garza MRN: [id number] Date of Birth: 02-07-1966   Patient has met 32 of 11 long term goals due to improved activity tolerance, improved balance, postural control, ability to compensate for deficits, functional use of  LEFT upper extremity, improved awareness and improved coordination.  Patient to discharge at overall Supervision level.  Patient's care partner is independent to provide the necessary supervision assistance at discharge.  Patient reports her sister will be with her and able to provide recommended 24/7 supervision.  Pt's sister did not attend any formal family education, however pt is able to advocate for self and reports understanding of recommendations and will be able to direct her care/supervision upon d/c.    Reasons goals not met: N/A  Recommendation:  Patient will benefit from ongoing skilled OT services in outpatient setting to continue to advance functional skills in the area of BADL and Reduce care partner burden.  Equipment: No equipment provided  Reasons for discharge: treatment goals met and discharge from hospital  Patient/family agrees with progress made and goals achieved: Yes  OT Discharge Precautions/Restrictions  Precautions Precautions: Fall Precaution Comments: mild L hemi General   Vital Signs Therapy Vitals Temp: 98.5 F (36.9 C) Temp Source: Oral Pulse Rate: (!) 59 Resp: 15 BP: (!) 143/61 Patient Position (if appropriate): Sitting Oxygen Therapy SpO2: 100 % O2 Device: Room Air Pain Pain Assessment Pain Scale: 0-10 Pain Score: 0-No pain ADL ADL Eating: Modified independent Where Assessed-Eating: Edge of bed Grooming: Supervision/safety Where Assessed-Grooming: Standing at sink Upper Body Bathing: Supervision/safety Where Assessed-Upper Body Bathing: Shower Lower Body Bathing: Supervision/safety Where Assessed-Lower Body Bathing: Shower Upper Body Dressing:  Supervision/safety Where Assessed-Upper Body Dressing: Edge of bed Lower Body Dressing: Supervision/safety Where Assessed-Lower Body Dressing: Edge of bed Toileting: Supervision/safety Where Assessed-Toileting: Glass blower/designer: Close supervision Toilet Transfer Method: Counselling psychologist: Extra wide drop arm bedside commode Tub/Shower Transfer: Close supervison Clinical cytogeneticist Method: Optometrist: Civil engineer, contracting with back,Walk in shower,Grab bars Vision Baseline Vision/History: Wears glasses Wears Glasses: Reading only Patient Visual Report: No change from baseline Vision Assessment?: No apparent visual deficits Perception  Perception: Within Functional Limits Praxis Praxis: Intact Cognition Overall Cognitive Status: Within Functional Limits for tasks assessed Arousal/Alertness: Awake/alert Attention: Selective Selective Attention: Appears intact Memory: Appears intact Awareness: Appears intact Problem Solving: Appears intact Safety/Judgment: Appears intact Sensation Sensation Light Touch: Appears Intact Proprioception: Appears Intact Coordination Gross Motor Movements are Fluid and Coordinated: No Fine Motor Movements are Fluid and Coordinated: No Coordination and Movement Description: mild uncoordination due to mild L hemi (UE>LE), decreased balance/posutal control, and decreased endurance Finger Nose Finger Test: WFL on RUE, decreased ROM on LUE but improvements since eval Heel Shin Test: WFL bilaterally but slow Motor  Motor Motor: Hemiplegia Motor - Skilled Clinical Observations: mild L hemi UE>LE, generalized weakness, decreased balance/postural control Mobility  Bed Mobility Bed Mobility: Rolling Right;Rolling Left;Sit to Supine;Supine to Sit Rolling Right: Independent with assistive device Rolling Left: Independent with assistive device Supine to Sit: Independent with assistive device Sit to Supine: Independent  with assistive device Transfers Sit to Stand: Supervision/Verbal cueing Stand to Sit: Supervision/Verbal cueing  Trunk/Postural Assessment  Cervical Assessment Cervical Assessment: Within Functional Limits Thoracic Assessment Thoracic Assessment: Within Functional Limits Lumbar Assessment Lumbar Assessment: Exceptions to [facility] (posterior pelvic tilt) Postural Control Postural Control: Deficits on evaluation  Balance Balance Balance Assessed: Yes Static Sitting Balance Static Sitting - Balance Support: Feet supported;No upper extremity supported Static  Sitting - Level of Assistance: 7: Independent Dynamic Sitting Balance Dynamic Sitting - Balance Support: Feet supported;No upper extremity supported Dynamic Sitting - Level of Assistance: 6: Modified independent (Device/Increase time) (increased time) Static Standing Balance Static Standing - Balance Support: Bilateral upper extremity supported (RW) Static Standing - Level of Assistance: 5: Stand by assistance (supervision) Dynamic Standing Balance Dynamic Standing - Balance Support: Bilateral upper extremity supported (RW) Dynamic Standing - Level of Assistance: 5: Stand by assistance (supervision) Extremity/Trunk Assessment RUE Assessment RUE Assessment: Within Functional Limits General Strength Comments: grossly 5/5 LUE Assessment LUE Assessment: Exceptions to [facility] Passive Range of Motion (PROM) Comments: WNL Active Range of Motion (AROM) Comments: shoulder flexion to 120*, elbow flexion WNL, improving wrist flexion/extension, significant improvement in digit flexion, loose gross grasp General Strength Comments: LUE 4/5 deltoid, 3+/5 bicep, tricep, 1/5 wrist   Shannon Garza 11/01/2020, 2:25 PM

## 2020-11-01 NOTE — Progress Notes (Signed)
Occupational Therapy Session Note  Patient Details  Name: Shannon Garza MRN: [id number] Date of Birth: [birth year]  Today's Date: 11/01/2020 OT Individual Time: 6967-8938 OT Individual Time Calculation (min): 60 min    Short Term Goals: Week 2:  OT Short Term Goal 1 (Week 2): Pt will complete LB dressing with min assist OT Short Term Goal 2 (Week 2): Pt will complete bathing with min assist OT Short Term Goal 3 (Week 2): Pt will complete toileting with supervision  Skilled Therapeutic Interventions/Progress Updates:    Treatment session with focus on d/c planning and LUE NMR.  Pt received upright in recliner pleased to report increased functional use of LUE.  Pt demonstrating overhead reach to ~120* and improved gross grasp.  Pt continues to demonstrate decreased tip to tip and 3 jaw chuck to pick up small/flat items like checkers and poker pieces.  Pt completed toilet transfer and clothing management to simulate toileting with supervision.  Discussed increased functional use of LUE and alternative positioning of LUE to increase success with pulling pants over hips.  Pt transported to therapy gym for time management.  Completed Box and Blocks assessment with R: 50 blocks and L: 21 blocks in 60 second time limit.  Challenged pt in stacking 1" blocks with pt able to complete stack of 3 with increased time and then able to remove all 3, demonstrating increased functional grip with larger items.  Discussed various tasks to continue to address fine motor during routine tasks of meal prep and work activities.  Pt reports doing a lot of typing for work.  Encouraged pt to work on finger isolation movements (per HEP previously given) and to begin practice typing at home.  Engaged in BITS in standing with LUE to select dots randomly.  Pt with decreased index finger isolation therefore frequently tapping screen with other digits when reaching for stimulus.  Pt returned to room and transferred back to recliner  with RW supervision.  Pt reports ready for d/c tomorrow and pleased with recovery.    Therapy Documentation Precautions:  Precautions Precautions: Fall Precaution Comments: mild L hemi Restrictions Weight Bearing Restrictions: No General:   Vital Signs: Therapy Vitals Temp: 98.5 F (36.9 C) Temp Source: Oral Pulse Rate: (!) 59 Resp: 15 BP: (!) 143/61 Patient Position (if appropriate): Sitting Oxygen Therapy SpO2: 100 % O2 Device: Room Air Pain: Pain Assessment Pain Scale: 0-10 Pain Score: 0-No pain   Therapy/Group: Individual Therapy  Rosalio Loud 11/01/2020, 2:18 PM

## 2020-11-01 NOTE — Progress Notes (Signed)
Physical Therapy Session Note  Patient Details  Name: Shannon Garza MRN: [id number] Date of Birth: Dec 29, 1965  Today's Date: 11/01/2020 PT Individual Time: 0800-0900 PT Individual Time Calculation (min): 60 min   Short Term Goals: Week 2:  PT Short Term Goal 1 (Week 2): STG=LTG due to LOS  Skilled Therapeutic Interventions/Progress Updates:    Pt received seated in bed, agreeable to PT session. No complaints of pain. Pt is independent for bed mobility with use of bedrails. Pt able to get dressed while seated EOB with setup A overall, does require some assist in standing to pull pants fully over hips. Pt performs all sit to stand and stand pivot transfers with RW at Supervision level throughout session. Ambulation 150 ft (+) with RW at Supervision level throughout session, cues needed for safe RW management as pt tends to lift RW up from floor especially with turns. Ascend/descend 16 x 6" steps laterally with L handrail at Supervision level to simulate home environment. Pt exhibits good safety awareness with stairs and makes sure both feet are fully on step. Car transfer with Supervision with use of RW. Ambulation up/down ramp, across uneven surface, and up/down curb step with RW at Supervision level, cues for safe RW management. Patient demonstrates increased fall risk as noted by score of 53/56 on Berg Balance Scale.  (<36= high risk for falls, close to 100%; 37-45 significant >80%; 46-51 moderate >50%; 52-55 lower >25%). Reviewed score and functional implications as well as improvement in score during rehab stay. Pt understanding of education that therapy recommends Supervision level assist upon d/c home. Pt left seated in recliner chair in room with needs in reach at end of session.  Therapy Documentation Precautions:  Precautions Precautions: Fall Precaution Comments: mild L hemi Restrictions Weight Bearing Restrictions: No  Balance: Standardized Balance Assessment Standardized Balance  Assessment: Berg Balance Test Berg Balance Test Sit to Stand: Able to stand without using hands and stabilize independently Standing Unsupported: Able to stand safely 2 minutes Sitting with Back Unsupported but Feet Supported on Floor or Stool: Able to sit safely and securely 2 minutes Stand to Sit: Sits safely with minimal use of hands Transfers: Able to transfer safely, minor use of hands Standing Unsupported with Eyes Closed: Able to stand 10 seconds safely Standing Ubsupported with Feet Together: Able to place feet together independently and stand 1 minute safely From Standing, Reach Forward with Outstretched Arm: Can reach confidently >25 cm (10") From Standing Position, Pick up Object from Floor: Able to pick up shoe, needs supervision From Standing Position, Turn to Look Behind Over each Shoulder: Looks behind from both sides and weight shifts well Turn 360 Degrees: Able to turn 360 degrees safely one side only in 4 seconds or less Standing Unsupported, Alternately Place Feet on Step/Stool: Able to stand independently and complete 8 steps >20 seconds Standing Unsupported, One Foot in Front: Able to place foot tandem independently and hold 30 seconds Standing on One Leg: Able to lift leg independently and hold > 10 seconds Total Score: 53    Therapy/Group: Individual Therapy   Peter Congo, PT, DPT  11/01/2020, 10:39 AM

## 2020-11-01 NOTE — Discharge Instructions (Signed)
Inpatient Rehab Discharge Instructions  Priscilla Kirstein Discharge date and time: No discharge date for patient encounter.   Activities/Precautions/ Functional Status: Activity: activity as tolerated Diet: vegeterian Wound Care: Routine skin checks Functional status:  ___ No restrictions     ___ Walk up steps independently ___ 24/7 supervision/assistance   ___ Walk up steps with assistance ___ Intermittent supervision/assistance  ___ Bathe/dress independently ___ Walk with walker     _x__ Bathe/dress with assistance ___ Walk Independently    ___ Shower independently ___ Walk with assistance    ___ Shower with assistance ___ No alcohol     ___ Return to work/school ________  Special Instructions: No driving smoking or alcohol    COMMUNITY REFERRALS UPON DISCHARGE:    Outpatient: PT & OT             Agency:[facility] [phone]             Appointment Date/Time:WILL CALL TO SET UP APPOINTMENTS  Medical Equipment/Items Ordered:ROLLING WALKER                                                 Agency/Supplier:[facility]  [phone]    STROKE/TIA DISCHARGE INSTRUCTIONS SMOKING Cigarette smoking nearly doubles your risk of having a stroke & is the single most alterable risk factor  If you smoke or have smoked in the last 12 months, you are advised to quit smoking for your health.  Most of the excess cardiovascular risk related to smoking disappears within a year of stopping.  Ask you doctor about anti-smoking medications  [place] Quit Line: 1-800-QUIT NOW  Free Smoking Cessation Classes [phone]  CHOLESTEROL Know your levels; limit fat & cholesterol in your diet  Lipid Panel     Component Value Date/Time   CHOL 209 (H) 10/13/2020 0348   TRIG 66 10/13/2020 0348   HDL 52 10/13/2020 0348   CHOLHDL 4.0 10/13/2020 0348   VLDL 13 10/13/2020 0348   LDLCALC 144 (H) 10/13/2020 0348      Many patients benefit from treatment even if their cholesterol  is at goal.  Goal: Total Cholesterol (CHOL) less than 160  Goal:  Triglycerides (TRIG) less than 150  Goal:  HDL greater than 40  Goal:  LDL (LDLCALC) less than 100   BLOOD PRESSURE American Stroke Association blood pressure target is less that 120/80 mm/Hg  Your discharge blood pressure is:  BP: (!) 169/95  Monitor your blood pressure  Limit your salt and alcohol intake  Many individuals will require more than one medication for high blood pressure  DIABETES (A1c is a blood sugar average for last 3 months) Goal HGBA1c is under 7% (HBGA1c is blood sugar average for last 3 months)  Diabetes: No known diagnosis of diabetes    Lab Results  Component Value Date   HGBA1C 5.6 10/13/2020     Your HGBA1c can be lowered with medications, healthy diet, and exercise.  Check your blood sugar as directed by your physician  Call your physician if you experience unexplained or low blood sugars.  PHYSICAL ACTIVITY/REHABILITATION Goal is 30 minutes at least 4 days per week  Activity: Increase activity slowly, Therapies: Physical Therapy: Home Health Return to work:   Activity decreases your risk of heart attack and stroke and makes your heart stronger.  It helps control your weight and blood  pressure; helps you relax and can improve your mood.  Participate in a regular exercise program.  Talk with your doctor about the best form of exercise for you (dancing, walking, swimming, cycling).  DIET/WEIGHT Goal is to maintain a healthy weight  Your discharge diet is:  Diet Order            Diet vegetarian Room service appropriate? Yes; Fluid consistency: Thin  Diet effective now                 liquids Your height is:  Height: 5\' 3"  (160 cm) Your current weight is:   Your Body Mass Index (BMI) is:     Following the type of diet specifically designed for you will help prevent another stroke.  Your goal weight range is:    Your goal Body Mass Index (BMI) is 19-24.  Healthy food  habits can help reduce 3 risk factors for stroke:  High cholesterol, hypertension, and excess weight.  RESOURCES Stroke/Support Group:  Call [phone]   STROKE EDUCATION PROVIDED/REVIEWED AND GIVEN TO PATIENT Stroke warning signs and symptoms How to activate emergency medical system (call 911). Medications prescribed at discharge. Need for follow-up after discharge. Personal risk factors for stroke. Pneumonia vaccine given:  Flu vaccine given:  My questions have been answered, the writing is legible, and I understand these instructions.  I will adhere to these goals & educational materials that have been provided to me after my discharge from the hospital.      My questions have been answered and I understand these instructions. I will adhere to these goals and the provided educational materials after my discharge from the hospital.  Patient/Caregiver Signature _______________________________ Date __________  Clinician Signature _______________________________________ Date __________  Please bring this form and your medication list with you to all your follow-up doctor's appointments.

## 2020-11-01 NOTE — Progress Notes (Signed)
PROGRESS NOTE   Subjective/Complaints: Weekend notes reviewed- hypertensive, no other issues. Labs excellent this morning  ROS: +left upper extremity weakness. Denies pain, constipation   Objective:   No results found. No results for input(s): WBC, HGB, HCT, PLT in the last 72 hours. Recent Labs    11/01/20 0332  NA 139  K 3.8  CL 107  CO2 22  GLUCOSE 91  BUN 8  CREATININE 0.77  CALCIUM 9.9    Intake/Output Summary (Last 24 hours) at 11/01/2020 0848 Last data filed at 11/01/2020 0743 Gross per 24 hour  Intake 480 ml  Output --  Net 480 ml        Physical Exam: Vital Signs Blood pressure (!) 168/62, pulse (!) 54, temperature 98.8 F (37.1 C), temperature source Oral, resp. rate 16, height 5\' 3"  (1.6 m), weight (!) 142.3 kg, SpO2 98 %.  Gen: no distress, normal appearing HEENT: oral mucosa pink and moist, NCAT Cardio: Bradycardic Chest: normal effort, normal rate of breathing Abd: soft, non-distended Ext: no edema Psych: pleasant, normal affect Skin: intact Neuro: Mental Status: She is alert; slightly slowed processing  Comments: Patient is alert in no acute distress. Speech is mildly dysarthric but intelligible. Oriented x3 and follows commands. Fair awareness of deficits. Left central 7. Motor: LUE 2+ deltoid, biceps, triceps, 3/5 wrist and 1/5 hand grip, LLE 4/5 prox to distal. Senses pain and LT in all 4's. No resting tone .DTR's 1+.    Assessment/Plan: 1. Functional deficits which require 3+ hours per day of interdisciplinary therapy in a comprehensive inpatient rehab setting.  Physiatrist is providing close team supervision and 24 hour management of active medical problems listed below.  Physiatrist and rehab team continue to assess barriers to discharge/monitor patient progress toward functional and medical goals  Care Tool:  Bathing    Body parts bathed by patient: Left  arm,Chest,Abdomen,Front perineal area,Right upper leg,Left upper leg,Face,Left lower leg,Right lower leg,Buttocks   Body parts bathed by helper: Right arm     Bathing assist Assist Level: Minimal Assistance - Patient > 75%     Upper Body Dressing/Undressing Upper body dressing   What is the patient wearing?: Pull over shirt    Upper body assist Assist Level: Supervision/Verbal cueing    Lower Body Dressing/Undressing Lower body dressing      What is the patient wearing?: Underwear/pull up,Pants     Lower body assist Assist for lower body dressing: Supervision/Verbal cueing     Toileting Toileting    Toileting assist Assist for toileting: Moderate Assistance - Patient 50 - 74%     Transfers Chair/bed transfer  Transfers assist     Chair/bed transfer assist level: Supervision/Verbal cueing     Locomotion Ambulation   Ambulation assist      Assist level: Minimal Assistance - Patient > 75% Assistive device: Cane-straight Max distance: 220'   Walk 10 feet activity   Assist     Assist level: Minimal Assistance - Patient > 75% Assistive device: Cane-straight   Walk 50 feet activity   Assist    Assist level: Minimal Assistance - Patient > 75% Assistive device: Cane-straight    Walk 150 feet activity  Assist Walk 150 feet activity did not occur: Safety/medical concerns (fatigue, L hemi, generalized weakness)  Assist level: Minimal Assistance - Patient > 75% Assistive device: Cane-straight    Walk 10 feet on uneven surface  activity   Assist Walk 10 feet on uneven surfaces activity did not occur: Safety/medical concerns (fatigue, L hemi, generalized weakness)         Wheelchair     Assist Will patient use wheelchair at discharge?: No Type of Wheelchair: Manual    Wheelchair assist level: Supervision/Verbal cueing Max wheelchair distance: 20ft    Wheelchair 50 feet with 2 turns activity    Assist        Assist Level:  Minimal Assistance - Patient > 75%   Wheelchair 150 feet activity     Assist      Assist Level: Total Assistance - Patient < 25%   Blood pressure (!) 168/62, pulse (!) 54, temperature 98.8 F (37.1 C), temperature source Oral, resp. rate 16, height 5\' 3"  (1.6 m), weight (!) 142.3 kg, SpO2 98 %.  Medical Problem List and Plan: 1.Left hemiparesissecondary to rightPosterior limb internal capsule/Corona radiate as well as history of CVA 2018 -patient may shower -ELOS/Goals: 10-12 days. mod I to supervision with PT, OT, SLP -WHO LUE: patient refuses, painful- encouraged strengthening exercises.   Continue CIR PT,OT  2. Antithrombotics: -DVT/anticoagulation:D/c Lovenox- currently ambulating >150 feet, discussed with patient and she is happy about this -antiplatelet therapy: Continue Aspirin 81 mg daily and Plavix75 mgx3 weeks then aspirin alone  3. Pain Management:Denies pain.  4. Mood:Provide emotional support -antipsychotic agents: N/A 5. Neuropsych: This patientiscapable of making decisions on herown behalf. 6. Skin/Wound Care:Routine skin checks. May d/c IV 7. Fluids/Electrolytes/Nutrition:Routine in and outs with follow-up chemistries 8. Hypertension. Lisinopril 20 mg daily. Monitor with increased mobility. Stopped hydralazine as per patient's preference given her concern regarding side effects. Discussed risk of stroke with uncontrolled BP.   3/4: decrease Lisinopril to 30mg  given low diastolics and bradycardia. D/c hydralazine since not requiring.  Vitals:   10/31/20 1945 11/01/20 0317  BP: (!) 141/69 (!) 168/62  Pulse:  (!) 54  Resp: 18 16  Temp: 98.3 F (36.8 C) 98.8 F (37.1 C)  SpO2: 98%    3/7: BP increased with lower dose of Lisinopril. Discussed with patient increasing back to 40mg  starting today 9. Hyperlipidemia. Continue Lipitor 10. Prediabetes. Hemoglobin A1c 5.6. CBGs  discontinued 11. Bradycardic to 50s: continue to monitor HR TID  3/4: 54 on 3/4, decrease Lisinopril as above  3/7: continues to be low but asymptomatic, continue to monitor. 12. Morbid obesity: providing regular dietary education 13. Hypokalemia: supplement and repeat K+ 2/23  3/7: K+ normal, continue consuming high potassium foods.  14. Hypoalbuminemia: advised high protein foods- nuts are a great source given vegan status. Consulted dietary and they have provided these for her.  15. Constipation: may use Swiss Kriss herbal supplement- she says it is with pharmacy- I have placed pharmacy consult to let them know she may use this.  2/25: magnesium citrate ordered for today  2/28-3/7: resolved 16. Disposition: Dr R completed FMLA paperwork- has been received. SW. Discussed with patient goal of return to work in mid-May.   LOS: 14 days A FACE TO FACE EVALUATION WAS PERFORMED  3/25 Ante Arredondo 11/01/2020, 8:48 AM

## 2020-11-01 NOTE — Progress Notes (Signed)
Inpatient Rehabilitation Care Coordinator Discharge Note  The overall goal for the admission was met for:   Discharge location: Yes-HOME WITH SISTER AND BROTHER-SISTER TO PROVIDE 24/7 SUPERVISION  Length of Stay: Yes-15 DAYS  Discharge activity level: Yes-SUPERVISION LEVEL  Home/community participation: Yes  Services provided included: MD, RD, PT, OT, SLP, RN, CM, TR, Pharmacy, Neuropsych and SW  Financial Services: Private Insurance: [place] offered to/list presented to:YES  Follow-up services arranged: Outpatient: [facility]-PT & OT WILL CALL TO SCHEDULE APPOINTMENTS, DME: [facility] and Patient/Family has no preference for HH/DME agencies  Comments (or additional information):PT DID WELL AND REACHED SUPERVISION LEVEL, SISTER WHOM SHE LIVES WITH WILL PROVIDE CARE. SET UP WITH PCP HAS APPOINTMENT WITH DR Scarlette Calico 4/26 @ 2:00 PM  Patient/Family verbalized understanding of follow-up arrangements: Yes  Individual responsible for coordination of the follow-up plan: SELF [phone]  Confirmed correct DME delivered: Elease Hashimoto 11/01/2020    Ladell Bey, Gardiner Rhyme

## 2020-11-02 ENCOUNTER — Other Ambulatory Visit: Payer: Self-pay | Admitting: Physician Assistant

## 2020-11-02 MED ORDER — LISINOPRIL 40 MG PO TABS: 40.0000 mg | ORAL_TABLET | Freq: Every day | ORAL | 0 refills | Status: DC

## 2020-11-02 MED FILL — LISINOPRIL 40 MG TABS: 40 | 30 days supply | Qty: 30 | Fill #0

## 2020-11-02 NOTE — Progress Notes (Signed)
PROGRESS NOTE   Subjective/Complaints: Ready for d/c today!  ROS: +left upper extremity weakness. Denies pain, constipation   Objective:   No results found. No results for input(s): WBC, HGB, HCT, PLT in the last 72 hours. Recent Labs    11/01/20 0332  NA 139  K 3.8  CL 107  CO2 22  GLUCOSE 91  BUN 8  CREATININE 0.77  CALCIUM 9.9    Intake/Output Summary (Last 24 hours) at 11/02/2020 0925 Last data filed at 11/02/2020 0718 Gross per 24 hour  Intake 600 ml  Output --  Net 600 ml        Physical Exam: Vital Signs Blood pressure (!) 160/65, pulse (!) 51, temperature 98.7 F (37.1 C), temperature source Oral, resp. rate 16, height 5\' 3"  (1.6 m), weight 123.3 kg, SpO2 100 %.  Gen: no distress, normal appearing HEENT: oral mucosa pink and moist, NCAT Cardio: Reg rate Chest: normal effort, normal rate of breathing Abd: soft, non-distended Ext: no edema Psych: pleasant, normal affect Skin: intact Neuro:    Mental Status: She is alert; slightly slowed processing     Comments: Patient is alert in no acute distress.  Speech is mildly dysarthric but intelligible.  Oriented x3 and follows commands.  Fair awareness of deficits. Left central 7. Motor: LUE 2+ deltoid, biceps, triceps, 3/5 wrist and 1/5 hand grip, LLE 4/5 prox to distal. Senses pain and LT in all 4's. No resting tone .DTR's 1+.     Assessment/Plan: 1. Functional deficits which require 3+ hours per day of interdisciplinary therapy in a comprehensive inpatient rehab setting.  Physiatrist is providing close team supervision and 24 hour management of active medical problems listed below.  Physiatrist and rehab team continue to assess barriers to discharge/monitor patient progress toward functional and medical goals  Care Tool:  Bathing    Body parts bathed by patient: Left arm,Chest,Abdomen,Front perineal area,Right upper leg,Left upper leg,Face,Left  lower leg,Right lower leg,Buttocks,Right arm   Body parts bathed by helper: Right arm     Bathing assist Assist Level: Supervision/Verbal cueing     Upper Body Dressing/Undressing Upper body dressing   What is the patient wearing?: Pull over shirt    Upper body assist Assist Level: Supervision/Verbal cueing    Lower Body Dressing/Undressing Lower body dressing      What is the patient wearing?: Underwear/pull up,Pants     Lower body assist Assist for lower body dressing: Supervision/Verbal cueing     Toileting Toileting    Toileting assist Assist for toileting: Supervision/Verbal cueing     Transfers Chair/bed transfer  Transfers assist     Chair/bed transfer assist level: Supervision/Verbal cueing     Locomotion Ambulation   Ambulation assist      Assist level: Supervision/Verbal cueing Assistive device: Walker-rolling Max distance: 150'   Walk 10 feet activity   Assist     Assist level: Supervision/Verbal cueing Assistive device: Walker-rolling   Walk 50 feet activity   Assist    Assist level: Supervision/Verbal cueing Assistive device: Walker-rolling    Walk 150 feet activity   Assist Walk 150 feet activity did not occur: Safety/medical concerns (fatigue, L hemi, generalized weakness)  Assist level: Supervision/Verbal cueing Assistive device: Walker-rolling    Walk 10 feet on uneven surface  activity   Assist Walk 10 feet on uneven surfaces activity did not occur: Safety/medical concerns (fatigue, L hemi, generalized weakness)   Assist level: Supervision/Verbal cueing Assistive device: Photographer Will patient use wheelchair at discharge?: No Type of Wheelchair: Manual    Wheelchair assist level: Supervision/Verbal cueing Max wheelchair distance: 49ft    Wheelchair 50 feet with 2 turns activity    Assist        Assist Level: Minimal Assistance - Patient > 75%   Wheelchair 150  feet activity     Assist      Assist Level: Total Assistance - Patient < 25%   Blood pressure (!) 160/65, pulse (!) 51, temperature 98.7 F (37.1 C), temperature source Oral, resp. rate 16, height 5\' 3"  (1.6 m), weight 123.3 kg, SpO2 100 %.  Medical Problem List and Plan: 1.  Left hemiparesis secondary to right Posterior limb internal capsule/Corona radiate as well as history of CVA 2018             -patient may shower             -ELOS/Goals: 10-12 days. mod I to supervision with PT, OT, SLP             -WHO LUE: patient refuses, painful- encouraged strengthening exercises.   DC home   2.  Antithrombotics: -DVT/anticoagulation: D/c Lovenox- currently ambulating >150 feet, discussed with patient and she is happy about this             -antiplatelet therapy: Continue Aspirin 81 mg daily and Plavix 75 mg x3 weeks then aspirin alone  3. Pain Management: Denies pain.  4. Mood: Provide emotional support             -antipsychotic agents: N/A 5. Neuropsych: This patient is capable of making decisions on her own behalf. 6. Skin/Wound Care: Routine skin checks. May d/c IV 7. Fluids/Electrolytes/Nutrition: Routine in and outs with follow-up chemistries 8.  Hypertension.  Lisinopril 20 mg daily.  Monitor with increased mobility. Stopped hydralazine as per patient's preference given her concern regarding side effects. Discussed risk of stroke with uncontrolled BP.   3/4: decrease Lisinopril to 30mg  given low diastolics and bradycardia. D/c hydralazine since not requiring.  Vitals:   11/01/20 1934 11/02/20 0418  BP: 131/67 (!) 160/65  Pulse: (!) 54 (!) 51  Resp: 15 16  Temp: (!) 97.5 F (36.4 C) 98.7 F (37.1 C)  SpO2: 97% 100%   3/7: BP increased with lower dose of Lisinopril. Discussed with patient increasing back to 40mg  starting today- discussed with 01/02/21 resend med to pharmacy  9.  Hyperlipidemia.  Continue Lipitor 10.  Prediabetes.  Hemoglobin A1c 5.6.  CBGs discontinued 11.  Bradycardic to 50s: continue to monitor HR TID  3/4: 54 on 3/4, decrease Lisinopril as above  3/7: continues to be low but asymptomatic, continue to monitor. 12. Morbid obesity: providing regular dietary education 13. Hypokalemia: supplement and repeat K+ 2/23  3/7: K+ normal, continue consuming high potassium foods.  14. Hypoalbuminemia: advised high protein foods- nuts are a great source given vegan status. Consulted dietary and they have provided these for her.  15. Constipation: may use Swiss Kriss herbal supplement- she says it is with pharmacy- I have placed pharmacy consult to let them know she may use this.  2/25: magnesium citrate ordered for today  2/28-3/7: resolved 16. Disposition: Dr R completed FMLA paperwork- has been received. SW. Discussed with patient goal of return to work in mid-May.    >30 minutes spent in discharge of patient including review of medications and follow-up appointments, physical examination, and in answering all patient's questions  LOS: 15 days A FACE TO FACE EVALUATION WAS PERFORMED  Clint Bolder P Abdulaziz Toman 11/02/2020, 9:25 AM

## 2020-11-02 NOTE — Progress Notes (Signed)
Pt refused SCDs. Education given. RN will continue to monitor. 

## 2020-11-02 NOTE — Progress Notes (Signed)
Patient discharged home with family.  Pharmacy delivered medications and returned all herbal meds patient brought with her to hospital.  PA and MD spoke with patient and gave discharge instructions.

## 2020-11-09 ENCOUNTER — Ambulatory Visit: Payer: BC Managed Care – PPO | Admitting: Physical Therapy

## 2020-11-09 ENCOUNTER — Ambulatory Visit: Payer: BC Managed Care – PPO | Admitting: Occupational Therapy

## 2020-11-11 ENCOUNTER — Other Ambulatory Visit: Payer: Self-pay

## 2020-11-11 ENCOUNTER — Ambulatory Visit: Payer: BC Managed Care – PPO | Attending: Physical Medicine and Rehabilitation

## 2020-11-11 ENCOUNTER — Encounter: Payer: Self-pay | Admitting: Occupational Therapy

## 2020-11-11 ENCOUNTER — Ambulatory Visit: Payer: BC Managed Care – PPO | Admitting: Occupational Therapy

## 2020-11-11 VITALS — BP 170/87 | HR 58

## 2020-11-11 DIAGNOSIS — M6281 Muscle weakness (generalized): Secondary | ICD-10-CM | POA: Insufficient documentation

## 2020-11-11 DIAGNOSIS — R208 Other disturbances of skin sensation: Secondary | ICD-10-CM

## 2020-11-11 DIAGNOSIS — R600 Localized edema: Secondary | ICD-10-CM | POA: Insufficient documentation

## 2020-11-11 DIAGNOSIS — R278 Other lack of coordination: Secondary | ICD-10-CM

## 2020-11-11 DIAGNOSIS — R41840 Attention and concentration deficit: Secondary | ICD-10-CM | POA: Insufficient documentation

## 2020-11-11 DIAGNOSIS — R2681 Unsteadiness on feet: Secondary | ICD-10-CM | POA: Diagnosis not present

## 2020-11-11 DIAGNOSIS — R2689 Other abnormalities of gait and mobility: Secondary | ICD-10-CM | POA: Diagnosis not present

## 2020-11-11 DIAGNOSIS — R296 Repeated falls: Secondary | ICD-10-CM

## 2020-11-11 DIAGNOSIS — I69354 Hemiplegia and hemiparesis following cerebral infarction affecting left non-dominant side: Secondary | ICD-10-CM

## 2020-11-11 NOTE — Therapy (Signed)
[facility] [address] [place], [place], [address] Phone: [phone]   Fax:  [phone]  Occupational Therapy Evaluation  Patient Details  Name: Shannon Garza MRN: [id number] Date of Birth: 54-25-1967 Referring Provider (OT): Charlotte Crumb   Encounter Date: 11/11/2020   OT End of Session - 11/11/20 1329    Visit Number 1    Number of Visits 17    Date for OT Re-Evaluation 01/25/21    Authorization Type BCBS;  VL:MN    OT Start Time 1230    OT Stop Time 1320    OT Time Calculation (min) 50 min    Activity Tolerance Patient tolerated treatment well    Behavior During Therapy [facility] for tasks assessed/performed           Past Medical History:  Diagnosis Date  . Diabetes mellitus without complication (HCC)   . Hypertension     History reviewed. No pertinent surgical history.  There were no vitals filed for this visit.   Subjective Assessment - 11/11/20 1235    Subjective  I want my hand back - not good circulation, doesn't straighten out    Patient Stated Goals Use of left hand    Currently in Pain? No/denies    Pain Score 0-No pain             [facility] OT Assessment - 11/11/20 1238      Assessment   Medical Diagnosis R CVA    Referring Provider (OT) Charlotte Crumb    Onset Date/Surgical Date 10/12/20    Hand Dominance Right    Prior Therapy Acute care and CIR with d/c on 11/02/20      Precautions   Precautions Fall      Balance Screen   Has the patient fallen in the past 6 months Yes    How many times? 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    Prior Level of Function Light Housekeeping Independent    Light Housekeeping Performs light daily tasks such as dishwashing, bed making    Prior Level of Function Meal Prep Did minimal cooking prior - sister in law cooks    Meal Prep --   wants to be able to chop, peel vegetables/fruits     Written Expression   Dominant Hand Right      Vision - History   Baseline Vision Wears glasses only for reading      Vision Assessment   Eye Alignment Within  Functional Limits    Ocular Range of Motion Restricted on left    Alignment/Gaze Preference Within Defined Limits    Tracking/Visual Pursuits Left eye does not track laterally    Saccades Additional eye shifts occurred during testing    Comment Eyelid droops on left eye - since first stroke      Activity Tolerance   Activity Tolerance Comments Feels this was more affected initially - feels this is improving      Cognition   Overall Cognitive Status Impaired/Different from baseline    Cognition Comments Slow processing, decreased selective attention      Posture/Postural Control   Posture/Postural Control Postural limitations    Postural Limitations Rounded Shoulders;Increased thoracic kyphosis;Posterior pelvic tilt    Posture Comments slight trunk shortening on left      Sensation   Light Touch Impaired by gross assessment   numbness left hand more volarly   Stereognosis Appears Intact    Hot/Cold Not tested;Appears Intact    Proprioception  Appears Intact    Additional Comments Numbness (pins and needles) Left hand      Coordination   Gross Motor Movements are Fluid and Coordinated No    Fine Motor Movements are Fluid and Coordinated No    9 Hole Peg Test Right;Left    Right 9 Hole Peg Test 27.34    Left 9 Hole Peg Test 3:02:43      Perception   Perception Within Functional Limits      Praxis   Praxis Intact      Tone   Assessment Location Left Upper Extremity      ROM / Strength   AROM / PROM / Strength AROM      AROM   Overall AROM  Deficits    Overall AROM Comments LUE limitations at all joints - has ~70% active movement available      Strength   Overall Strength Deficits    Overall Strength Comments Unable to proximal LUE against gravity      Hand Function   Right Hand Gross Grasp Functional    Right Hand Grip (lbs) 78    Right Hand Lateral Pinch 18 lbs    Left Hand Gross Grasp Impaired    Left Hand Grip (lbs) 20    Left Hand Lateral Pinch 4 lbs      LUE Tone   LUE Tone Mild;Hypotonic                           OT Education - 11/11/20 1328    Education Details Results of evaluation, potential for improvement and potential goals for OT    Person(s) Educated Patient    Methods Explanation    Comprehension Verbalized understanding            OT Short Term Goals - 11/11/20 1335      OT SHORT TERM GOAL #1   Title Patient will complete an HEP designed to improve LUE proximal active movement    Time 4    Period Weeks    Status New    Target Date 12/26/20      OT SHORT TERM GOAL #2   Title Patient will tie shoes with increased time using BUE    Time 4    Period Weeks    Status New      OT SHORT TERM GOAL #3   Title Patient will utilize BUE to wring out washcloth effectively.  Time 4    Period Weeks    Status New      OT SHORT TERM GOAL #4   Title Patient will hold phone in left hand to allow her to send full  text with right hand    Time 4    Period Weeks     Status New             OT Long Term Goals - 11/11/20 1340      OT LONG TERM GOAL #1   Title Patient will complete an updated HEP designed to improve functional use of LUE with improved coordination, range of motion and strength    Time 8    Period Weeks    Status New    Target Date 01/25/21      OT LONG TERM GOAL #2   Title Patient will complete 9 hole peg test in less than 2 min with LUE to help pick up and maneuver small items    Baseline over 3 min    Time 8    Period Weeks    Status New      OT LONG TERM GOAL #3   Title Patient will don/doff bra with modified independence    Time 8    Period Weeks    Status New      OT LONG TERM GOAL #4   Title Patient will utilize LUE to wash right side of body without assistance    Time 8    Period Weeks    Status New      OT LONG TERM GOAL #5   Title Patient will safely cut fruit using BUE method    Time 8    Period Weeks    Status New      Long Term Additional Goals   Additional Long Term Goals Yes      OT LONG TERM GOAL #6   Title Foto score at discharge    Time 8    Period Weeks    Status New                 Plan - 11/11/20 1330    Clinical Impression Statement Pt is a 55 y.o. female who presents with acute L-sided limb and facial weakness. MRI revealed acute/subacute infarct of R posterior internal capsule and remote infarct of L basal ganglia and L pons. Outside window for tPA. PMH: CVA 4 years ago, HTN, and DM2. Admitted 10/12/20, d/c home following rehab on 11/02/20.  Patient was working full time as a Freight forwarder, and hopes to return to independent living and work.  Patient with Left hemiplegia, left sided numbness, decreased strength and coordiantion on left, decreased cognition - attention, speed of processing, and insight which impede here ability to perform ADL/IADL and work activities.  Patient will benefit from skilled OT intervention to increase independence with ADL, and to help her retunr to  prior level of functioning.    OT Occupational Profile and History Detailed Assessment- Review of Records and additional review of physical, cognitive, psychosocial history related to current functional performance    Occupational performance deficits (Please refer to evaluation for details): ADL's;IADL's;Work    Body Structure / Function / Physical Skills ADL;Coordination;Endurance;GMC;UE functional use;Balance;Decreased knowledge of precautions;Sensation;Vestibular;Vision;Pain;IADL;Flexibility;Decreased knowledge of use of DME;Body mechanics;Cardiopulmonary status limiting activity;Dexterity;FMC;Strength;Tone;ROM;Mobility;Edema    Cognitive Skills Attention;Safety Awareness;Problem Solve;Sequencing    Rehab Potential Good    Clinical Decision Making Several treatment options, min-mod task modification necessary    Comorbidities  Affecting Occupational Performance: May have comorbidities impacting occupational performance    Modification or Assistance to Complete Evaluation  No modification of tasks or assist necessary to complete eval    OT Frequency 2x / week    OT Duration 8 weeks    OT Treatment/Interventions Self-care/ADL training;Electrical Stimulation;Therapeutic exercise;Visual/perceptual remediation/compensation;Aquatic Therapy;Neuromuscular education;Splinting;Patient/family education;Building services engineer;Therapeutic activities;Balance training;DME and/or AE instruction;Manual Therapy;Cognitive remediation/compensation    Plan Review any HEP from CIR,  Initiate shoulder and elbow HEP - may need seated active assisted.    Consulted and Agree with Plan of Care Patient           Patient will benefit from skilled therapeutic intervention in order to improve the following deficits and impairments:   Body Structure / Function / Physical Skills: ADL,Coordination,Endurance,GMC,UE functional use,Balance,Decreased knowledge of  precautions,Sensation,Vestibular,Vision,Pain,IADL,Flexibility,Decreased knowledge of use of DME,Body mechanics,Cardiopulmonary status limiting activity,Dexterity,FMC,Strength,Tone,ROM,Mobility,Edema Cognitive Skills: Attention,Safety Awareness,Problem Solve,Sequencing     Visit Diagnosis: Other lack of coordination - Plan: Ot plan of care cert/re-cert  Muscle weakness (generalized) - Plan: Ot plan of care cert/re-cert  Hemiplegia and hemiparesis following cerebral infarction affecting left non-dominant side (HCC) - Plan: Ot plan of care cert/re-cert  Attention and concentration deficit - Plan: Ot plan of care cert/re-cert  Other disturbances of skin sensation - Plan: Ot plan of care cert/re-cert  Unsteadiness on feet - Plan: Ot plan of care cert/re-cert  Localized edema - Plan: Ot plan of care cert/re-cert    Problem List Patient Active Problem List   Diagnosis Date Noted  . CVA (cerebral vascular accident) (HCC) 10/18/2020  . Primary hypertension   . Left arm weakness   . Left leg weakness   . Subcortical infarction [facility]) 10/12/2020    Collier Salina, OTR/L 11/11/2020, 1:48 PM  [facility] [address] [place], [place], [address] Phone: [phone]   Fax:  [phone]  Name: Agueda Houpt MRN: [id number] Date of Birth: March 05, 1966

## 2020-11-11 NOTE — Therapy (Signed)
[facility] [address] [place], [place], [address] Phone: [phone]   Fax:  [phone]  Physical Therapy Evaluation  Patient Details  Name: Shannon Garza MRN: [id number] Date of Birth: May 27, 1966 Referring Provider (PT): Sula Soda MD, and Dr. Pearlean Brownie   Encounter Date: 11/11/2020   PT End of Session - 11/11/20 1339    Visit Number 1    Number of Visits 9    Date for PT Re-Evaluation 01/10/21    Authorization Type BCBS    PT Start Time 1147    PT Stop Time 1229    PT Time Calculation (min) 42 min    Equipment Utilized During Treatment Other (comment)   min guard to S prn   Activity Tolerance Patient tolerated treatment well    Behavior During Therapy [facility] for tasks assessed/performed           Past Medical History:  Diagnosis Date  . Diabetes mellitus without complication (HCC)   . Hypertension     History reviewed. No pertinent surgical history.  Vitals:   11/11/20 1209 11/11/20 1215  BP: (!) 151/100 (!) 170/87  Pulse: (!) 58 (!) 58      Subjective Assessment - 11/11/20 1153    Subjective Pt s/p CVA on 10/12/20 with d/c on 10/18/20 to CIR from 10/18/20-11/02/20. Pt with L sided weakness since recent CVA. Pt is having difficulty donning clothes and more L UE weakness vs. LE. Pt reported difficulty with txfs in/out of bed 2/2 LUE weakness, she has a step stool for bed as it is high. Pt had some fatigue with walking when first d/c and pt fell at [facility] but denied injury (she was getting off scooter). PT noted pt experienced postural sway during amb. and pt stated that happens from time to time. Pt has N/T in L hand, L great toe, and R hand after sleeping. Pt just purchased BP cuff to monitor at home. Pt drove herself today as she didn't have a driver.    Pertinent History Hx of CVA in 2017, DM, HTN    Patient Stated Goals Improve balance, work on floor transfers so she can get up off floor if she falls,  get use of hand back.    Currently in Pain? No/denies              [facility] PT Assessment - 11/11/20 1202      Assessment   Medical Diagnosis R CVA    Referring Provider (PT) Sula Soda MD, and Dr. Pearlean Brownie    Onset Date/Surgical Date 10/12/20    Hand Dominance Right    Prior Therapy Acute care and CIR with d/c on 11/02/20      Precautions   Precautions Fall;Other (comment)   monitor BP   Precaution Comments based on hx, DGI and TUG scores.      Restrictions   Weight Bearing Restrictions No      Balance Screen   Has the patient fallen in the past 6 months Yes    How many times? 5    Has the patient had a decrease in activity level because of a fear of falling?  No    Is the patient reluctant to leave their home because of a fear of falling?  No      Home Environment   Living Environment Private residence    Living Arrangements Other relatives   brother and sister-in-law   Available Help at Discharge Family;Available PRN/intermittently    Type  of Home House    Home Access Stairs to enter    Entrance Stairs-Number of Steps 3    Entrance Stairs-Rails Can reach both    Home Layout Two level;Bed/bath upstairs    Alternate Level Stairs-Number of Steps 15    Alternate Level Stairs-Rails Left    Home Equipment Walker - 2 wheels      Prior Function   Level of Independence Independent    Vocation Full time employment    Vocation Requirements [employer], 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      Posture/Postural Control   Posture/Postural Control Postural limitations    Postural Limitations Rounded Shoulders;Increased thoracic kyphosis      Tone   Assessment Location Left Lower Extremity;Right Lower Extremity      ROM /  Strength   AROM / PROM / Strength AROM;Strength      AROM   Overall AROM  Within functional limits for tasks performed    Overall AROM Comments BLE AROM WNL.      Strength   Overall Strength Within functional limits for tasks performed    Overall Strength Comments BLE: 4+/5 hip flexion, knee ext/flex, seated hip abd/add, and dorsiflexion. Will assess hip ext and abd in prone and sidelying next session.      Transfers   Transfers Sit to Stand;Stand to Sit    Sit to Stand 5: Supervision;With upper extremity assist    Stand to Sit 5: Supervision;Without upper extremity assist    Transfer Cueing Pt able to perform sit to stand without UE support but required UE support during sit to stand.      Ambulation/Gait   Ambulation/Gait Yes    Ambulation/Gait Assistance 5: Supervision    Ambulation/Gait Assistance Details S to ensure safety as incr. postural sway noted during amb from lobby to exam room.    Ambulation Distance (Feet) 100 Feet    Assistive device None    Gait Pattern Step-through pattern;Decreased arm swing - left;Decreased arm swing - right;Decreased stride length    Ambulation Surface Indoor;Level    Gait velocity 2.851ft/sec. no AD    Gait Comments BP after amb. 186/84 and HR: 59      Standardized Balance Assessment   Standardized Balance Assessment Dynamic Gait Index;Timed Up and Go Test      Dynamic Gait Index   Level Surface Mild Impairment    Change in Gait Speed Mild Impairment    Gait with Horizontal Head Turns Mild Impairment    Gait with Vertical Head Turns Mild Impairment    Gait and Pivot Turn Mild Impairment    Step Over Obstacle Mild Impairment    Step Around Obstacles Normal    Steps Moderate Impairment    Total Score 16      Timed Up and Go Test   TUG Normal TUG    Normal TUG (seconds) 13.88    TUG Comments no AD      LUE Tone   LUE Tone Within Functional Limits      RLE Tone   RLE Tone Within Functional Limits  Objective measurements completed on examination: See above findings.               PT Education - 11/11/20 1338    Education Details PT educated pt on outcome measure results, PT POC, frequency and duration. PT also educated pt on the importance of following MD orders regarding not driving for safety reasons and to monitor BP at home and notify MD of elevated BP 2/2 hx of CVAs.    Person(s) Educated Patient    Methods Explanation    Comprehension Verbalized understanding;Need further instruction            PT Short Term Goals - 11/11/20 1345      PT SHORT TERM GOAL #1   Title same as LTG.             PT Long Term Goals - 11/11/20 1345      PT LONG TERM GOAL #1   Title Pt will be IND in HEP to improve balance, endurance, and strength. TARGET DATE FOR ALL LTGS: 12/09/20    Time 4    Period Weeks    Status New      PT LONG TERM GOAL #2   Title Pt will improve DGI score to >/=20/24 to decr. falls risk.    Time 4    Period Weeks    Status New      PT LONG TERM GOAL #3   Title Pt will perform BERG and PT will write goal as indicated.    Time 4    Period Weeks    Status New      PT LONG TERM GOAL #4   Title Pt will report no falls in last 3 weeks to improve safety.    Time 4    Period Weeks    Status New      PT LONG TERM GOAL #5   Title Pt will improve TUG time to </=13 sec. to decr. falls risk.    Baseline 13.88 sec.    Time 4    Period Weeks    Status New      Additional Long Term Goals   Additional Long Term Goals Yes      PT LONG TERM GOAL #6   Title Pt will amb. 500' over even and paved surfaces IND to improve functional mobility.    Time 4    Period Weeks    Status New                  Plan - 11/11/20 1339    Clinical Impression Statement Pt is a pleasant 54y/o female presenting to OPPT neuro s/p R CVA. Pt's PMH is significant for the following: Hx of CVA in 2017, DM, HTN. The following impairments were  noted upon exam: gait deviations, impaired balance, decr. endurance, postural dysfunction, impaired sensation in RUE per pt report, PT will formally assess hip ext and abd next session.Pt's BP was also elevated but pt denied pain, weakness, and no confusion noted but PT will continue to monitor. Pt's gait speed indicated pt on unable to safely amb. in the community. Pt's TUG time and DGI score indicated pt is at risk for falls. Pt would benefit from skilled PT to improve safety during functional mobility.    Personal Factors and Comorbidities Comorbidity 3+;Fitness;Past/Current Experience;Transportation    Comorbidities Hx of CVA in 2017, DM, HTN    Examination-Activity Limitations Bed Mobility;Carry;Dressing;Stand;Stairs;Locomotion Level;Transfers    Examination-Participation Restrictions Cleaning;Driving;Laundry;Occupation    Stability/Clinical  Decision Making Evolving/Moderate complexity   BP elevated   Clinical Decision Making Moderate    Rehab Potential Good    PT Frequency 2x / week    PT Duration 4 weeks    PT Treatment/Interventions ADLs/Self Care Home Management;Biofeedback;Aquatic Therapy;DME Instruction;Gait training;Stair training;Functional mobility training;Therapeutic activities;Therapeutic exercise;Balance training;Neuromuscular re-education;Orthotic Fit/Training;Patient/family education;Vestibular    PT Next Visit Plan MONITOR BP. Assess B hip ext/abd in prone and sidelying. Perform BERG and write goals. Begin balance, flexibility HEP and walking program.    Consulted and Agree with Plan of Care Patient           Patient will benefit from skilled therapeutic intervention in order to improve the following deficits and impairments:  Abnormal gait,Decreased strength,Impaired sensation,Postural dysfunction,Decreased balance,Decreased knowledge of use of DME,Impaired flexibility,Decreased endurance,Obesity  Visit Diagnosis: Other abnormalities of gait and mobility - Plan: PT plan of  care cert/re-cert  Other disturbances of skin sensation - Plan: PT plan of care cert/re-cert  Repeated falls - Plan: PT plan of care cert/re-cert  Hemiplegia and hemiparesis following cerebral infarction affecting left non-dominant side (HCC) - Plan: PT plan of care cert/re-cert     Problem List Patient Active Problem List   Diagnosis Date Noted  . CVA (cerebral vascular accident) (HCC) 10/18/2020  . Primary hypertension   . Left arm weakness   . Left leg weakness   . Subcortical infarction (HCC) 10/12/2020    Miller,Jennifer L 11/11/2020, 1:50 PM  [facility] [address] [place], [place], [address] Phone: [phone]   Fax:  [phone]  Name: Marisel Tostenson MRN: [id number] Date of Birth: [birth year]  Zerita Boers, PT,DPT 11/11/20 1:50 PM Phone: [phone] Fax: [phone]

## 2020-11-15 ENCOUNTER — Ambulatory Visit: Payer: BC Managed Care – PPO

## 2020-11-15 ENCOUNTER — Other Ambulatory Visit: Payer: Self-pay

## 2020-11-15 DIAGNOSIS — M6281 Muscle weakness (generalized): Secondary | ICD-10-CM | POA: Diagnosis not present

## 2020-11-15 DIAGNOSIS — R278 Other lack of coordination: Secondary | ICD-10-CM | POA: Diagnosis not present

## 2020-11-15 DIAGNOSIS — R2681 Unsteadiness on feet: Secondary | ICD-10-CM | POA: Diagnosis not present

## 2020-11-15 DIAGNOSIS — I69354 Hemiplegia and hemiparesis following cerebral infarction affecting left non-dominant side: Secondary | ICD-10-CM | POA: Diagnosis not present

## 2020-11-15 DIAGNOSIS — R600 Localized edema: Secondary | ICD-10-CM | POA: Diagnosis not present

## 2020-11-15 DIAGNOSIS — R2689 Other abnormalities of gait and mobility: Secondary | ICD-10-CM | POA: Diagnosis not present

## 2020-11-15 DIAGNOSIS — R296 Repeated falls: Secondary | ICD-10-CM | POA: Diagnosis not present

## 2020-11-15 DIAGNOSIS — R41840 Attention and concentration deficit: Secondary | ICD-10-CM | POA: Diagnosis not present

## 2020-11-15 DIAGNOSIS — R208 Other disturbances of skin sensation: Secondary | ICD-10-CM | POA: Diagnosis not present

## 2020-11-15 NOTE — Patient Instructions (Signed)
Access Code: [email or web address] URL: [email or web address] Date: 11/15/2020 Prepared by: Gustavus Bryant  Exercises Hooklying Single Leg Bent Knee Fallouts with Resistance - 2 x daily - 7 x weekly - 3 sets - 10 reps Side Stepping with Resistance at Thighs - 2 x daily - 7 x weekly - 3 sets - 10 reps Supine Bridge - 2 x daily - 7 x weekly - 3 sets - 10 reps Romberg Stance - 1 x daily - 7 x weekly - 1 sets - 2 reps - 30 hold

## 2020-11-16 NOTE — Therapy (Signed)
[facility] [address] [place], [place], [address] Phone: [phone]   Fax:  [phone]  Physical Therapy Treatment  Patient Details  Name: Shannon Garza MRN: [id number] Date of Birth: 1966/03/17 Referring Provider (PT): Sula Soda MD, and Dr. Pearlean Brownie   Encounter Date: 11/15/2020   PT End of Session - 11/16/20 0811    Visit Number 2    Number of Visits 9    Date for PT Re-Evaluation 01/10/21    Authorization Type BCBS    PT Start Time 1445    PT Stop Time 1530    PT Time Calculation (min) 45 min    Activity Tolerance Patient tolerated treatment well    Behavior During Therapy [facility] for tasks assessed/performed           Past Medical History:  Diagnosis Date  . Diabetes mellitus without complication (HCC)   . Hypertension     History reviewed. No pertinent surgical history.  There were no vitals filed for this visit.   Subjective Assessment - 11/16/20 0808    Subjective No pain, med changes or falls to report, denies pain, does not need AD for ambulation    Pertinent History Hx of CVA in 2017, DM, HTN    How long can you sit comfortably? unlimited    How long can you stand comfortably? <10 min    How long can you walk comfortably? <10 min    Patient Stated Goals Improve balance, work on floor transfers so she can get up off floor if she falls, get use of hand back.    Currently in Pain? No/denies             11/15/20 0001  Strength  Right Hip ABduction 4/5  Right Hip Extension 4/5  Berg Balance Test  Sit to Stand 3  Standing Unsupported 4  Sitting with Back Unsupported but Feet Supported on Floor or Stool 4  Stand to Sit 4  Transfers 4  Standing Unsupported with Eyes Closed 4  Standing Unsupported with Feet Together 4  From Standing, Reach Forward with Outstretched Arm 4  From Standing Position, Pick up Object from Floor 4  From Standing Position, Turn to Look Behind Over each  Shoulder 4  Turn 360 Degrees 3  Standing Unsupported, Alternately Place Feet on Step/Stool 4  Standing Unsupported, One Foot in Front 3  Standing on One Leg 2  Total Score 51       11/15/20 0001  Knee/Hip Exercises: Supine  Bridges Strengthening;Both;2 sets;10 reps  Other Supine Knee/Hip Exercises supine marching 2x10 alt.  Other Supine Knee/Hip Exercises supine hip fallouts 2x10 against red band      11/15/20 0001  Balance Exercises: Standing  Standing Eyes Opened Narrow base of support (BOS);Solid surface;2 reps;30 secs;Limitations  Standing Eyes Opened Limitations performed in corner  Sidestepping 5 reps;Theraband;Limitations  Theraband Level (Sidestepping) Level 2 (Red)  Sidestepping Limitations 5 trips at counter with UE support                          PT Education - 11/15/20 1523    Education Details [id number]    Person(s) Educated Patient    Methods Explanation;Demonstration;Verbal cues;Handout;Tactile cues    Comprehension Verbalized understanding;Returned demonstration            PT Short Term Goals - 11/11/20 1345      PT SHORT TERM GOAL #1   Title same as LTG.  PT Long Term Goals - 11/15/20 0820      PT LONG TERM GOAL #1   Title Pt will be IND in HEP to improve balance, endurance, and strength. TARGET DATE FOR ALL LTGS: 12/09/20    Baseline HEP established    Time 4    Period Weeks    Status New      PT LONG TERM GOAL #2   Title Pt will improve DGI score to >/=20/24 to decr. falls risk.    Time 4    Period Weeks    Status New      PT LONG TERM GOAL #3   Title Pt will perform BERG and PT will write goal as indicated.    Baseline Initial BERG score 51, no goal needed    Time 4    Period Weeks    Status New      PT LONG TERM GOAL #4   Title Pt will report no falls in last 3 weeks to improve safety.    Time 4    Period Weeks    Status New      PT LONG TERM GOAL #5   Title Pt will improve TUG time to </=13  sec. to decr. falls risk.    Baseline 13.88 sec.    Time 4    Period Weeks    Status New      PT LONG TERM GOAL #6   Title Pt will amb. 500' over even and paved surfaces IND to improve functional mobility.    Time 4    Period Weeks    Status New                 Plan - 11/15/20 3295    Clinical Impression Statement Todays skilled session consisted of assessment of BERG, R hip strength and HEP review.  Added to HEP and issued red band, patient able to safely ambulate in clinic w/o need of AD, overall sterngth WFL, BERG score showing minimal deficits.  Recommend pursuing dynamic gait and balance challenges as static balance appears functional    Personal Factors and Comorbidities Comorbidity 3+;Fitness;Past/Current Experience;Transportation    Comorbidities Hx of CVA in 2017, DM, HTN    Examination-Activity Limitations Bed Mobility;Carry;Dressing;Stand;Stairs;Locomotion Level;Transfers    Examination-Participation Restrictions Cleaning;Driving;Laundry;Occupation    Stability/Clinical Decision Making Evolving/Moderate complexity   BP elevated   Rehab Potential Good    PT Frequency 2x / week    PT Duration 4 weeks    PT Treatment/Interventions ADLs/Self Care Home Management;Biofeedback;Aquatic Therapy;DME Instruction;Gait training;Stair training;Functional mobility training;Therapeutic activities;Therapeutic exercise;Balance training;Neuromuscular re-education;Orthotic Fit/Training;Patient/family education;Vestibular    PT Next Visit Plan MONITOR BP. Advance to dynamic gait and balance challenges    PT Home Exercise Plan [id number]    Consulted and Agree with Plan of Care Patient           Patient will benefit from skilled therapeutic intervention in order to improve the following deficits and impairments:  Abnormal gait,Decreased strength,Impaired sensation,Postural dysfunction,Decreased balance,Decreased knowledge of use of DME,Impaired flexibility,Decreased  endurance,Obesity  Visit Diagnosis: Unsteadiness on feet  Muscle weakness (generalized)     Problem List Patient Active Problem List   Diagnosis Date Noted  . CVA (cerebral vascular accident) (HCC) 10/18/2020  . Primary hypertension   . Left arm weakness   . Left leg weakness   . Subcortical infarction [facility]) 10/12/2020    Hildred Laser 11/16/2020, 8:23 AM  [facility] [address] [place], [place],  [address] Phone: [phone]   Fax:  [phone]  Name: Shannon Garza MRN: [id number] Date of Birth: [birth year]

## 2020-11-17 ENCOUNTER — Other Ambulatory Visit: Payer: Self-pay

## 2020-11-17 ENCOUNTER — Ambulatory Visit: Payer: BC Managed Care – PPO

## 2020-11-17 DIAGNOSIS — R2681 Unsteadiness on feet: Secondary | ICD-10-CM | POA: Diagnosis not present

## 2020-11-17 DIAGNOSIS — M6281 Muscle weakness (generalized): Secondary | ICD-10-CM

## 2020-11-17 DIAGNOSIS — R600 Localized edema: Secondary | ICD-10-CM | POA: Diagnosis not present

## 2020-11-17 DIAGNOSIS — I69354 Hemiplegia and hemiparesis following cerebral infarction affecting left non-dominant side: Secondary | ICD-10-CM | POA: Diagnosis not present

## 2020-11-17 DIAGNOSIS — R296 Repeated falls: Secondary | ICD-10-CM | POA: Diagnosis not present

## 2020-11-17 DIAGNOSIS — R208 Other disturbances of skin sensation: Secondary | ICD-10-CM | POA: Diagnosis not present

## 2020-11-17 DIAGNOSIS — R41840 Attention and concentration deficit: Secondary | ICD-10-CM | POA: Diagnosis not present

## 2020-11-17 DIAGNOSIS — R278 Other lack of coordination: Secondary | ICD-10-CM | POA: Diagnosis not present

## 2020-11-17 DIAGNOSIS — R2689 Other abnormalities of gait and mobility: Secondary | ICD-10-CM | POA: Diagnosis not present

## 2020-11-17 NOTE — Therapy (Signed)
[facility] [address] [place], [place], [address] Phone: [phone]   Fax:  [phone]  Physical Therapy Treatment  Patient Details  Name: Shannon Garza MRN: [id number] Date of Birth: 1966-02-01 Referring Provider (PT): Sula Soda MD, and Dr. Pearlean Brownie   Encounter Date: 11/17/2020   PT End of Session - 11/17/20 1205    Visit Number 3    Number of Visits 9    Date for PT Re-Evaluation 01/10/21    Authorization Type BCBS    PT Start Time 1100    PT Stop Time 1145    PT Time Calculation (min) 45 min    Equipment Utilized During Treatment Other (comment)    Activity Tolerance Patient tolerated treatment well    Behavior During Therapy [facility] for tasks assessed/performed           Past Medical History:  Diagnosis Date  . Diabetes mellitus without complication (HCC)   . Hypertension     History reviewed. No pertinent surgical history.  There were no vitals filed for this visit.   Subjective Assessment - 11/17/20 1204    Subjective No pain, med changes or falls to report, denies pain, does not need AD for ambulation    Pertinent History Hx of CVA in 2017, DM, HTN    How long can you sit comfortably? unlimited    How long can you stand comfortably? <10 min    How long can you walk comfortably? <10 min    Patient Stated Goals Improve balance, work on floor transfers so she can get up off floor if she falls, get use of hand back.    Currently in Pain? No/denies    Pain Score 0-No pain                             [facility] Adult PT Treatment/Exercise - 11/17/20 0001      Transfers   Transfers Sit to Stand    Sit to Stand 4: Min guard    Five time sit to stand comments  5x arms crossed, 10x on foam, 5x on foam arms crossed      Knee/Hip Exercises: Aerobic   Nustep L1 arms 10 x 8'               Balance Exercises - 11/17/20 0001      Balance Exercises: Standing   Standing Eyes  Opened Narrow base of support (BOS);Head turns;Foam/compliant surface;1 rep;30 secs    Standing Eyes Opened Limitations head turns/nods 10x    Standing Eyes Closed Narrow base of support (BOS);Head turns;Foam/compliant surface;1 rep;30 secs;Limitations    Standing Eyes Closed Limitations 30s hold followed by head turns/nods 10x    Stepping Strategy Anterior;Posterior;Foam/compliant surface;10 reps;Limitations    Stepping Strategy Limitations step ups onto/off o frocker board    Rockerboard Anterior/posterior;Lateral;EO;Other time (comment);Intermittent UE support;Limitations    Rockerboard Limitations WS i AP and ML directons, 1 min hold, intermittent UE support    Tandem Gait Forward;Intermittent upper extremity support;5 reps;Limitations    Tandem Gait Limitations 5 trips in // bars with UE suupport as needed    Retro Gait Upper extremity support;5 reps;Limitations    Retro Gait Limitations 5 trips in // bars with diminishing UE support               PT Short Term Goals - 11/11/20 1345      PT SHORT TERM GOAL #1  Title same as LTG.             PT Long Term Goals - 11/15/20 0820      PT LONG TERM GOAL #1   Title Pt will be IND in HEP to improve balance, endurance, and strength. TARGET DATE FOR ALL LTGS: 12/09/20    Baseline HEP established    Time 4    Period Weeks    Status New      PT LONG TERM GOAL #2   Title Pt will improve DGI score to >/=20/24 to decr. falls risk.    Time 4    Period Weeks    Status New      PT LONG TERM GOAL #3   Title Pt will perform BERG and PT will write goal as indicated.    Baseline Initial BERG score 51, no goal needed    Time 4    Period Weeks    Status New      PT LONG TERM GOAL #4   Title Pt will report no falls in last 3 weeks to improve safety.    Time 4    Period Weeks    Status New      PT LONG TERM GOAL #5   Title Pt will improve TUG time to </=13 sec. to decr. falls risk.    Baseline 13.88 sec.    Time 4    Period  Weeks    Status New      PT LONG TERM GOAL #6   Title Pt will amb. 500' over even and paved surfaces IND to improve functional mobility.    Time 4    Period Weeks    Status New                 Plan - 11/17/20 1206    Clinical Impression Statement todays skilled session    Personal Factors and Comorbidities Comorbidity 3+;Fitness;Past/Current Experience;Transportation    Comorbidities Hx of CVA in 2017, DM, HTN    Examination-Activity Limitations Bed Mobility;Carry;Dressing;Stand;Stairs;Locomotion Level;Transfers    Examination-Participation Restrictions Cleaning;Driving;Laundry;Occupation    Stability/Clinical Decision Making Evolving/Moderate complexity   BP elevated   Rehab Potential Good    PT Frequency 2x / week    PT Duration 4 weeks    PT Treatment/Interventions ADLs/Self Care Home Management;Biofeedback;Aquatic Therapy;DME Instruction;Gait training;Stair training;Functional mobility training;Therapeutic activities;Therapeutic exercise;Balance training;Neuromuscular re-education;Orthotic Fit/Training;Patient/family education;Vestibular    PT Next Visit Plan MONITOR BP. Advance to dynamic gait and balance challenges    PT Home Exercise Plan [id number]    Consulted and Agree with Plan of Care Patient           Patient will benefit from skilled therapeutic intervention in order to improve the following deficits and impairments:  Abnormal gait,Decreased strength,Impaired sensation,Postural dysfunction,Decreased balance,Decreased knowledge of use of DME,Impaired flexibility,Decreased endurance,Obesity  Visit Diagnosis: No diagnosis found.     Problem List Patient Active Problem List   Diagnosis Date Noted  . CVA (cerebral vascular accident) (HCC) 10/18/2020  . Primary hypertension   . Left arm weakness   . Left leg weakness   . Subcortical infarction [facility]) 10/12/2020    Hildred Laser 11/17/2020, 1:08 PM  [facility] [address] [place], [place], [address] Phone: [phone]   Fax:  [phone]  Name: Paizley Ramella MRN: [id number] Date of Birth: 1965/12/22

## 2020-11-22 ENCOUNTER — Ambulatory Visit: Payer: BC Managed Care – PPO

## 2020-11-22 ENCOUNTER — Ambulatory Visit: Payer: BC Managed Care – PPO | Admitting: Occupational Therapy

## 2020-11-25 ENCOUNTER — Other Ambulatory Visit: Payer: Self-pay

## 2020-11-25 ENCOUNTER — Encounter: Payer: Self-pay | Admitting: Occupational Therapy

## 2020-11-25 ENCOUNTER — Ambulatory Visit: Payer: BC Managed Care – PPO

## 2020-11-25 ENCOUNTER — Ambulatory Visit: Payer: BC Managed Care – PPO | Admitting: Occupational Therapy

## 2020-11-25 VITALS — BP 138/84

## 2020-11-25 DIAGNOSIS — R208 Other disturbances of skin sensation: Secondary | ICD-10-CM | POA: Diagnosis not present

## 2020-11-25 DIAGNOSIS — R41840 Attention and concentration deficit: Secondary | ICD-10-CM | POA: Diagnosis not present

## 2020-11-25 DIAGNOSIS — R600 Localized edema: Secondary | ICD-10-CM | POA: Diagnosis not present

## 2020-11-25 DIAGNOSIS — R278 Other lack of coordination: Secondary | ICD-10-CM

## 2020-11-25 DIAGNOSIS — R296 Repeated falls: Secondary | ICD-10-CM

## 2020-11-25 DIAGNOSIS — R2689 Other abnormalities of gait and mobility: Secondary | ICD-10-CM | POA: Diagnosis not present

## 2020-11-25 DIAGNOSIS — R2681 Unsteadiness on feet: Secondary | ICD-10-CM

## 2020-11-25 DIAGNOSIS — I69354 Hemiplegia and hemiparesis following cerebral infarction affecting left non-dominant side: Secondary | ICD-10-CM

## 2020-11-25 DIAGNOSIS — M6281 Muscle weakness (generalized): Secondary | ICD-10-CM

## 2020-11-25 NOTE — Therapy (Signed)
[facility] [address] [place], [place], [address] Phone: [phone]   Fax:  [phone]  Physical Therapy Treatment  Patient Details  Name: Shannon Garza MRN: [id number] Date of Birth: [birth year] Referring Provider (PT): Sula Soda MD, and Dr. Pearlean Brownie   Encounter Date: 11/25/2020   PT End of Session - 11/25/20 1319    Visit Number 4    Number of Visits 9    Date for PT Re-Evaluation 01/10/21    Authorization Type BCBS    PT Start Time 1145    PT Stop Time 1230    PT Time Calculation (min) 45 min    Equipment Utilized During Treatment Other (comment)    Activity Tolerance Patient tolerated treatment well    Behavior During Therapy [facility] for tasks assessed/performed           Past Medical History:  Diagnosis Date  . Diabetes mellitus without complication (HCC)   . Hypertension     No past surgical history on file.  Vitals:   11/25/20 1156  BP: 138/84     Subjective Assessment - 11/25/20 1149    Subjective Contiues to do well, no pain, falls or med changes to report    Pertinent History Hx of CVA in 2017, DM, HTN    How long can you sit comfortably? unlimited    How long can you stand comfortably? <10 min    How long can you walk comfortably? <10 min    Currently in Pain? No/denies    Pain Onset More than a month ago                             [facility] Adult PT Treatment/Exercise - 11/25/20 0001      Transfers   Transfers Sit to Stand    Sit to Stand 4: Min guard    Five time sit to stand comments  2x10 arms crossed on airex, [place]flexion on second set      Ambulation/Gait   Ambulation/Gait Yes    Ambulation/Gait Assistance 5: Supervision    Ambulation/Gait Assistance Details S for overall safety with dual tasking    Ambulation Distance (Feet) 460 Feet    Assistive device None    Gait Pattern Step-through pattern    Ambulation Surface Level;Indoor    Gait Comments  peformed while moving ball hand to hand      Knee/Hip Exercises: Aerobic   Nustep L2 arms 10  x8'               Balance Exercises - 11/25/20 0001      Balance Exercises: Standing   Step Ups Forward;Lateral;4 inch;UE support 1;Limitations    Step Ups Limitations step up and over 4" block 5x with ea. LE (4 sets total)    Tandem Gait Forward;Upper extremity support;5 reps;Limitations    Tandem Gait Limitations 5 trips down counter    Retro Gait Upper extremity support;5 reps    Retro Gait Limitations 5 trips down counter    Sidestepping 5 reps    Sidestepping Limitations 5 trips down counter               PT Short Term Goals - 11/11/20 1345      PT SHORT TERM GOAL #1   Title same as LTG.             PT Long Term Goals - 11/15/20 1443  PT LONG TERM GOAL #1   Title Pt will be IND in HEP to improve balance, endurance, and strength. TARGET DATE FOR ALL LTGS: 12/09/20    Baseline HEP established    Time 4    Period Weeks    Status New      PT LONG TERM GOAL #2   Title Pt will improve DGI score to >/=20/24 to decr. falls risk.    Time 4    Period Weeks    Status New      PT LONG TERM GOAL #3   Title Pt will perform BERG and PT will write goal as indicated.    Baseline Initial BERG score 51, no goal needed    Time 4    Period Weeks    Status New      PT LONG TERM GOAL #4   Title Pt will report no falls in last 3 weeks to improve safety.    Time 4    Period Weeks    Status New      PT LONG TERM GOAL #5   Title Pt will improve TUG time to </=13 sec. to decr. falls risk.    Baseline 13.88 sec.    Time 4    Period Weeks    Status New      PT LONG TERM GOAL #6   Title Pt will amb. 500' over even and paved surfaces IND to improve functional mobility.    Time 4    Period Weeks    Status New                 Plan - 11/25/20 1151    Clinical Impression Statement todays skilled session focused on balance, strengthening of LEs and gait training  with dual tasking, emphasized STS transfers w/o UE assist cuing for form and balance, hand to hand ball transfer while walking, stepping tasks and sidestepping, encouraged proper posture with all tasks, continued need of strengthening and high level balance challenges    Personal Factors and Comorbidities Comorbidity 3+;Fitness;Past/Current Experience;Transportation    Comorbidities Hx of CVA in 2017, DM, HTN    Examination-Activity Limitations Bed Mobility;Carry;Dressing;Stand;Stairs;Locomotion Level;Transfers    Examination-Participation Restrictions Cleaning;Driving;Laundry;Occupation    Stability/Clinical Decision Making Evolving/Moderate complexity   BP elevated   Rehab Potential Good    PT Frequency 2x / week    PT Duration 4 weeks    PT Treatment/Interventions ADLs/Self Care Home Management;Biofeedback;Aquatic Therapy;DME Instruction;Gait training;Stair training;Functional mobility training;Therapeutic activities;Therapeutic exercise;Balance training;Neuromuscular re-education;Orthotic Fit/Training;Patient/family education;Vestibular    PT Next Visit Plan MONITOR BP. assess bed mobility and stairs as well as floor transfers    PT Home Exercise Plan [id number]    Consulted and Agree with Plan of Care Patient           Patient will benefit from skilled therapeutic intervention in order to improve the following deficits and impairments:  Abnormal gait,Decreased strength,Impaired sensation,Postural dysfunction,Decreased balance,Decreased knowledge of use of DME,Impaired flexibility,Decreased endurance,Obesity  Visit Diagnosis: Unsteadiness on feet  Muscle weakness (generalized)  Hemiplegia and hemiparesis following cerebral infarction affecting left non-dominant side (HCC)  Repeated falls     Problem List Patient Active Problem List   Diagnosis Date Noted  . CVA (cerebral vascular accident) (HCC) 10/18/2020  . Primary hypertension   . Left arm weakness   . Left leg weakness    . Subcortical infarction (HCC) 10/12/2020    Hildred Laser PT 11/25/2020, 1:29 PM  [facility] [address] [place], [place], [address] Phone: [phone]   Fax:  [phone]  Name: Shannon Garza MRN: [id number] Date of Birth: 1965-10-20

## 2020-11-25 NOTE — Therapy (Signed)
[facility] [address] [place], [place], [address] Phone: [phone]   Fax:  [phone]  Occupational Therapy Treatment  Patient Details  Name: Shannon Garza MRN: [id number] Date of Birth: 1965/11/25 Referring Provider (OT): Charlotte Crumb   Encounter Date: 11/25/2020   OT End of Session - 11/25/20 1121    Visit Number 2    Number of Visits 17    Date for OT Re-Evaluation 01/25/21    Authorization Type BCBS;  VL:[place]    OT Start Time 1121   pt arrived late   OT Stop Time 1145    OT Time Calculation (min) 24 min    Activity Tolerance Patient tolerated treatment well    Behavior During Therapy [facility] for tasks assessed/performed           Past Medical History:  Diagnosis Date  . Diabetes mellitus without complication (HCC)   . Hypertension     History reviewed. No pertinent surgical history.  There were no vitals filed for this visit.   Subjective Assessment - 11/25/20 1124    Subjective  "I still have a little tingling in here but no pain."    Patient Stated Goals Use of left hand    Currently in Pain? Yes    Pain Score 5     Pain Location Hand    Pain Orientation Left    Pain Descriptors / Indicators Tingling    Pain Type Neuropathic pain    Pain Onset More than a month ago    Pain Frequency Constant           TREATMENT:  Coordination: rotating ball was difficulty, tossed ball in one hand (L), flipping cards with increased time and min difficulty, pennies. Pt with difficulty and min drops with in hand manipulation of pennies.                        OT Short Term Goals - 11/25/20 1122      OT SHORT TERM GOAL #1   Title Patient will complete an HEP designed to improve LUE proximal active movement    Time 4    Period Weeks    Status New    Target Date 12/26/20      OT SHORT TERM GOAL #2   Title Patient will tie shoes with increased time using BUE    Time 4    Period Weeks     Status On-going      OT SHORT TERM GOAL #3   Title Patient will utilize BUE to wring out washcloth effectively.    Time 4    Period Weeks    Status Achieved      OT SHORT TERM GOAL #4   Title Patient will hold phone in left hand to allow her to send full  text with right hand    Time 4    Period Weeks    Status On-going   pt reports holding it but is having drops            OT Long Term Goals - 11/25/20 1123      OT LONG TERM GOAL #1   Title Patient will complete an updated HEP designed to improve functional use of LUE with improved coordination, range of motion and strength    Time 8    Period Weeks    Status New      OT LONG TERM GOAL #2   Title Patient will  complete 9 hole peg test in less than 2 min with LUE to help pick up and maneuver small items    Baseline over 3 min    Time 8    Period Weeks    Status New      OT LONG TERM GOAL #3   Title Patient will don/doff bra with modified independence    Time 8    Period Weeks    Status On-going   pt reports doing it and it is getting easier.     OT LONG TERM GOAL #4   Title Patient will utilize LUE to wash right side of body without assistance    Time 8    Period Weeks    Status Achieved      OT LONG TERM GOAL #5   Title Patient will safely cut fruit using BUE method    Time 8    Period Weeks    Status On-going   pt reports cutting up vegetables     OT LONG TERM GOAL #6   Title Foto score at discharge    Time 8    Period Weeks    Status New                 Plan - 11/25/20 1133    Clinical Impression Statement Pt progressing towards goals. Pt returns with improved movement in LUE and function. Pt continues to have deficits with coordination and grip strength and working towards unmet goals.    OT Occupational Profile and History Detailed Assessment- Review of Records and additional review of physical, cognitive, psychosocial history related to current functional performance    Occupational  performance deficits (Please refer to evaluation for details): ADL's;IADL's;Work    Body Structure / Function / Physical Skills ADL;Coordination;Endurance;GMC;UE functional use;Balance;Decreased knowledge of precautions;Sensation;Vestibular;Vision;Pain;IADL;Flexibility;Decreased knowledge of use of DME;Body mechanics;Cardiopulmonary status limiting activity;Dexterity;FMC;Strength;Tone;ROM;Mobility;Edema    Cognitive Skills Attention;Safety Awareness;Problem Solve;Sequencing    Rehab Potential Good    Clinical Decision Making Several treatment options, min-mod task modification necessary    Comorbidities Affecting Occupational Performance: May have comorbidities impacting occupational performance    Modification or Assistance to Complete Evaluation  No modification of tasks or assist necessary to complete eval    OT Frequency 2x / week    OT Duration 8 weeks    OT Treatment/Interventions Self-care/ADL training;Electrical Stimulation;Therapeutic exercise;Visual/perceptual remediation/compensation;Aquatic Therapy;Neuromuscular education;Splinting;Patient/family education;Building services engineer;Therapeutic activities;Balance training;DME and/or AE instruction;Manual Therapy;Cognitive remediation/compensation    Plan Review any HEP from CIR,  Initiate shoulder and elbow HEP - may need seated active assisted.    Consulted and Agree with Plan of Care Patient           Patient will benefit from skilled therapeutic intervention in order to improve the following deficits and impairments:   Body Structure / Function / Physical Skills: ADL,Coordination,Endurance,GMC,UE functional use,Balance,Decreased knowledge of precautions,Sensation,Vestibular,Vision,Pain,IADL,Flexibility,Decreased knowledge of use of DME,Body mechanics,Cardiopulmonary status limiting activity,Dexterity,FMC,Strength,Tone,ROM,Mobility,Edema Cognitive Skills: Attention,Safety Awareness,Problem Solve,Sequencing     Visit  Diagnosis: Unsteadiness on feet  Muscle weakness (generalized)  Other lack of coordination  Hemiplegia and hemiparesis following cerebral infarction affecting left non-dominant side (HCC)  Attention and concentration deficit  Other disturbances of skin sensation    Problem List Patient Active Problem List   Diagnosis Date Noted  . CVA (cerebral vascular accident) (HCC) 10/18/2020  . Primary hypertension   . Left arm weakness   . Left leg weakness   . Subcortical infarction [facility]) 10/12/2020    Junious Dresser MOT, OTR/L  11/25/2020, 11:46 AM  [facility] [address] [place], [place], [address] Phone: [phone]   Fax:  [phone]  Name: Shannon Garza MRN: [id number] Date of Birth: February 23, 1966

## 2020-11-29 ENCOUNTER — Ambulatory Visit: Payer: BC Managed Care – PPO | Attending: Physical Medicine and Rehabilitation

## 2020-11-29 ENCOUNTER — Encounter: Payer: BC Managed Care – PPO | Admitting: Occupational Therapy

## 2020-11-29 ENCOUNTER — Other Ambulatory Visit: Payer: Self-pay

## 2020-11-29 VITALS — BP 144/88

## 2020-11-29 DIAGNOSIS — R278 Other lack of coordination: Secondary | ICD-10-CM | POA: Diagnosis not present

## 2020-11-29 DIAGNOSIS — M6281 Muscle weakness (generalized): Secondary | ICD-10-CM

## 2020-11-29 DIAGNOSIS — R2681 Unsteadiness on feet: Secondary | ICD-10-CM | POA: Diagnosis not present

## 2020-11-29 DIAGNOSIS — I69354 Hemiplegia and hemiparesis following cerebral infarction affecting left non-dominant side: Secondary | ICD-10-CM | POA: Diagnosis not present

## 2020-11-29 DIAGNOSIS — R41840 Attention and concentration deficit: Secondary | ICD-10-CM | POA: Insufficient documentation

## 2020-11-29 DIAGNOSIS — R208 Other disturbances of skin sensation: Secondary | ICD-10-CM | POA: Insufficient documentation

## 2020-11-29 NOTE — Therapy (Signed)
[facility] [address] [place], [place], [address] Phone: [phone]   Fax:  [phone]  Physical Therapy Treatment  Patient Details  Name: Shannon Garza MRN: [id number] Date of Birth: Feb 07, 1966 Referring Provider (PT): Sula Soda MD, and Dr. Pearlean Brownie   Encounter Date: 11/29/2020   PT End of Session - 11/29/20 1144    Visit Number 5    Number of Visits 9    Date for PT Re-Evaluation 01/10/21    Authorization Type BCBS    Progress Note Due on Visit 10    PT Start Time 1100    PT Stop Time 1145    PT Time Calculation (min) 45 min    Equipment Utilized During Treatment Other (comment);Gait belt    Activity Tolerance Patient tolerated treatment well    Behavior During Therapy [facility] for tasks assessed/performed           Past Medical History:  Diagnosis Date  . Diabetes mellitus without complication (HCC)   . Hypertension     History reviewed. No pertinent surgical history.  Vitals:   11/29/20 1125  BP: (!) 144/88     Subjective Assessment - 11/29/20 1117    Subjective Incurred a fall stepping on small stool ot get into bed and lost balance backwar, denies injury and was avble to get off of floor I.    Pertinent History Hx of CVA in 2017, DM, HTN    How long can you sit comfortably? unlimited    How long can you stand comfortably? <10 min    How long can you walk comfortably? <10 min    Patient Stated Goals Improve balance, work on floor transfers so she can get up off floor if she falls, get use of hand back.    Pain Onset More than a month ago                             [facility] Adult PT Treatment/Exercise - 11/29/20 0001      Transfers   Transfers Sit to Stand    Sit to Stand 4: Min guard    Five time sit to stand comments  15x arms crossed      Knee/Hip Exercises: Aerobic   Nustep L3 arms 10 8'               Balance Exercises - 11/29/20 0001      Balance  Exercises: Standing   Step Ups Forward;Intermittent UE support;Limitations    Step Ups Limitations performed from low profile rockerboard    Tandem Gait Forward;Foam/compliant surface;Intermittent upper extremity support;5 reps;Limitations    Tandem Gait Limitations 5 trips in // bars    Retro Gait Foam/compliant surface;Upper extremity support;5 reps;Limitations    Retro Gait Limitations 5 trips in // bars    Sidestepping Foam/compliant support;Upper extremity support;5 reps;Limitations    Sidestepping Limitations 5 trips in // bars    Other Standing Exercises step downs performed from low profile rocker board               PT Short Term Goals - 11/11/20 1345      PT SHORT TERM GOAL #1   Title same as LTG.             PT Long Term Goals - 11/15/20 0820      PT LONG TERM GOAL #1   Title Pt will be IND in HEP to improve balance,  endurance, and strength. TARGET DATE FOR ALL LTGS: 12/09/20    Baseline HEP established    Time 4    Period Weeks    Status New      PT LONG TERM GOAL #2   Title Pt will improve DGI score to >/=20/24 to decr. falls risk.    Time 4    Period Weeks    Status New      PT LONG TERM GOAL #3   Title Pt will perform BERG and PT will write goal as indicated.    Baseline Initial BERG score 51, no goal needed    Time 4    Period Weeks    Status New      PT LONG TERM GOAL #4   Title Pt will report no falls in last 3 weeks to improve safety.    Time 4    Period Weeks    Status New      PT LONG TERM GOAL #5   Title Pt will improve TUG time to </=13 sec. to decr. falls risk.    Baseline 13.88 sec.    Time 4    Period Weeks    Status New      PT LONG TERM GOAL #6   Title Pt will amb. 500' over even and paved surfaces IND to improve functional mobility.    Time 4    Period Weeks    Status New                 Plan - 11/29/20 1144    Clinical Impression Statement todays skilled session focused on LE strengthenng and stepping tasks  focused on L foot placement strategies, introduced tandem walking and retro walking on foam surfaces    Personal Factors and Comorbidities Comorbidity 3+;Fitness;Past/Current Experience;Transportation    Comorbidities Hx of CVA in 2017, DM, HTN    Examination-Activity Limitations Bed Mobility;Carry;Dressing;Stand;Stairs;Locomotion Level;Transfers    Examination-Participation Restrictions Cleaning;Driving;Laundry;Occupation    Stability/Clinical Decision Making Evolving/Moderate complexity   BP elevated   Rehab Potential Good    PT Frequency 2x / week    PT Duration 4 weeks    PT Treatment/Interventions ADLs/Self Care Home Management;Biofeedback;Aquatic Therapy;DME Instruction;Gait training;Stair training;Functional mobility training;Therapeutic activities;Therapeutic exercise;Balance training;Neuromuscular re-education;Orthotic Fit/Training;Patient/family education;Vestibular    PT Next Visit Plan MONITOR BP. assess floor transfers as able, continue balance and stepping tasks, assess stability on compliant usrfaces    PT Home Exercise Plan [id number]    Consulted and Agree with Plan of Care Patient           Patient will benefit from skilled therapeutic intervention in order to improve the following deficits and impairments:  Abnormal gait,Decreased strength,Impaired sensation,Postural dysfunction,Decreased balance,Decreased knowledge of use of DME,Impaired flexibility,Decreased endurance,Obesity  Visit Diagnosis: Unsteadiness on feet  Muscle weakness (generalized)     Problem List Patient Active Problem List   Diagnosis Date Noted  . CVA (cerebral vascular accident) (HCC) 10/18/2020  . Primary hypertension   . Left arm weakness   . Left leg weakness   . Subcortical infarction (HCC) 10/12/2020    Hildred Laser PT 11/29/2020, 3:33 PM  [place] [address] [place], [place], [address] Phone: [phone]   Fax:   [phone]  Name: Shannon Garza MRN: [id number] Date of Birth: 24-May-1966

## 2020-12-01 ENCOUNTER — Other Ambulatory Visit (HOSPITAL_COMMUNITY): Payer: Self-pay

## 2020-12-01 ENCOUNTER — Encounter: Payer: Self-pay | Admitting: Physical Medicine and Rehabilitation

## 2020-12-01 ENCOUNTER — Encounter
Payer: BC Managed Care – PPO | Attending: Physical Medicine and Rehabilitation | Admitting: Physical Medicine and Rehabilitation

## 2020-12-01 ENCOUNTER — Other Ambulatory Visit: Payer: Self-pay

## 2020-12-01 VITALS — BP 179/72 | HR 59 | Temp 98.3°F | Ht 64.0 in | Wt 264.0 lb

## 2020-12-01 DIAGNOSIS — R29898 Other symptoms and signs involving the musculoskeletal system: Secondary | ICD-10-CM | POA: Insufficient documentation

## 2020-12-01 DIAGNOSIS — I639 Cerebral infarction, unspecified: Secondary | ICD-10-CM | POA: Diagnosis not present

## 2020-12-01 DIAGNOSIS — I10 Essential (primary) hypertension: Secondary | ICD-10-CM | POA: Diagnosis not present

## 2020-12-01 MED ORDER — LISINOPRIL 40 MG PO TABS
40.0000 mg | ORAL_TABLET | Freq: Every day | ORAL | 2 refills | Status: DC
  Filled 2020-12-01 (×2): qty 30, 30d supply, fill #0

## 2020-12-01 NOTE — Progress Notes (Signed)
Subjective:    Patient ID: Shannon Garza, female    DOB: April 15, 1966, 55 y.o.   MRN: [id number]  HPI  Shannon Garza presents for post-stroke hospital f/u.  1) Weakness in left side -she feels weaker some times at night -she was concerned regarding whether she was having repeat stroke -she is doing well with walking.  2) HTN: -BP is 179/72 today -it has been 150s/80s-90 at home -she has been taking lisinopril 40mg  daily.   3) obesity: -lost about 6-7 lbs.   4) diabetes: has been 82 when she checked at home.  -she been very careful with her diet.    Pain Inventory Average Pain 5 Pain Right Now 5 My pain is dull  LOCATION OF PAIN  Wrist, hand   BOWEL Number of stools per week: 8 Oral laxative use Yes  Type of laxative herbal laxative OTC Enema or suppository use No  History of colostomy No  Incontinent No   BLADDER Normal In and out cath, frequency n/a Able to self cath No  Bladder incontinence No  Frequent urination No  Leakage with coughing No  Difficulty starting stream No  Incomplete bladder emptying No    Mobility walk without assistance how many minutes can you walk? 8+ ability to climb steps?  yes do you drive?  yes Do you have any goals in this area?  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  yes  Neuro/Psych weakness  Prior Studies hospital f/u  Physicians involved in your care hospital f/u   No family history on file. Social History   Socioeconomic History  . Marital status: Single    Spouse name: Not on file  . Number of children: Not on file  . Years of education: Not on file  . Highest education level: Not on file  Occupational History  . Not on file  Tobacco Use  . Smoking status: Never Smoker  . Smokeless tobacco: Never Used  Vaping Use  . Vaping Use: Never used  Substance and Sexual Activity  .  Alcohol use: Not Currently  . Drug use: Not Currently  . Sexual activity: Not Currently  Other Topics Concern  . Not on file  Social History Narrative  . Not on file   Social Determinants of Health   Financial Resource Strain: Not on file  Food Insecurity: Not on file  Transportation Needs: Not on file  Physical Activity: Not on file  Stress: Not on file  Social Connections: Not on file   No past surgical history on file. Past Medical History:  Diagnosis Date  . Diabetes mellitus without complication (HCC)   . Hypertension    BP (!) 179/72   Pulse (!) 59   Temp 98.3 F (36.8 C)   Ht 5\' 4"  (1.626 m)   Wt 264 lb (119.7 kg)   SpO2 98%   BMI 45.32 kg/m   Opioid Risk Score:   Fall Risk Score:  `1  Depression screen PHQ 2/9  Depression screen PHQ 2/9 12/01/2020  Decreased Interest 2  Down, Depressed, Hopeless 0  PHQ - 2 Score 2  Altered sleeping 0  Tired, decreased energy 1  Change in appetite 0  Feeling bad or failure about yourself  0  Trouble concentrating 0  Moving slowly or fidgety/restless 1  Suicidal thoughts 0  PHQ-9 Score 4    Review of Systems  Constitutional: Negative.   HENT: Negative.   Eyes: Negative.   Respiratory: Negative.   Cardiovascular: Negative.   Gastrointestinal: Negative.   Endocrine: Negative.   Genitourinary: Negative.   Musculoskeletal: Negative.   Skin: Negative.   Allergic/Immunologic: Negative.   Neurological: Positive for weakness.  Hematological: Negative.   Psychiatric/Behavioral: Negative.   All other systems reviewed and are negative.      Objective:   Physical Exam  Gen: no distress, normal appearing HEENT: oral mucosa pink and moist, NCAT Cardio: Reg rate Chest: normal effort, normal rate of breathing Abd: soft, non-distended Ext: no edema Psych: pleasant, normal affect Skin: intact Neuro: Alert and oriented x3. Musculoskeletal: 4/5 strength in left upper and lower extremity.     Assessment & Plan:   Shannon Garza is a 55 year old woman who presents for post-stroke follow-up.   1) CVA -continue HEP -discussed weakness and prognosis  2) Prediabetes: Repeat HgbA1c  3) HTN: -refilled lisinopril -Reviewed BP meds today.  -Advised regarding healthy foods that can help lower blood pressure and provided with a list: 1) citrus foods 2) salmon and other fatty fish 3) swiss chard (leafy green) 4) pumpkin seeds 5) Beans and lentils 6) Berries 7) Amaranth (whole grain, can be cooked similarly to rice and oats) 8) Pistachios 9) Carrots 10) Celery 11) Tomatoes 12) Broccoli 13) Greek yogurt 14) Herbs and spices: Celery seed, cilantro, saffron, lemongrass, black cumin, ginseng, cinnamon, cardamom, sweet basil, and ginger 15) Chia and flax seeds 16) Beets 17) spinach -Educated that goal BP is 120/80. -Made goal to incorporate some of the above foods into her diet.

## 2020-12-01 NOTE — Patient Instructions (Addendum)
HTN: -Advised regarding healthy foods that can help lower blood pressure and provided with a list: 1) citrus foods 2) salmon and other fatty fish 3) swiss chard (leafy green) 4) pumpkin seeds 5) Beans and lentils 6) Berries 7) Amaranth (whole grain, can be cooked similarly to rice and oats) 8) Pistachios 9) Carrots 10) Celery 11) Tomatoes 12) Broccoli 13) Greek yogurt 14) Herbs and spices: Celery seed, cilantro, saffron, lemongrass, black cumin, ginseng, cinnamon, cardamom, sweet basil, and ginger 15) Chia and flax seeds 16) Beets 17) spinach 18) cinnamon -Educated that goal BP is 120/80.  Obesity: -Discussed foods that can assist in weight loss:  1) Eggs  2) Leafy greens  3) Salmon  4) Cruciferous vegetables  5) Lean beef and chicken breast  6) Boiled potatoes  7) Tuna  8) Beans and legumes  9) Soups  10) Cottage cheese  11) Avocados  12) Apple cider vinegar  13) Nuts  14) Whole grains  15) Chili pepper  16) Fruit- berries are some of the best  17) Grapefruit  18) Chia seeds  19) Coconut oil  20) Full-fat yogurt

## 2020-12-02 ENCOUNTER — Ambulatory Visit: Payer: BC Managed Care – PPO

## 2020-12-02 ENCOUNTER — Ambulatory Visit: Payer: BC Managed Care – PPO | Admitting: Occupational Therapy

## 2020-12-06 ENCOUNTER — Other Ambulatory Visit: Payer: Self-pay

## 2020-12-06 ENCOUNTER — Encounter: Payer: Self-pay | Admitting: Occupational Therapy

## 2020-12-06 ENCOUNTER — Ambulatory Visit: Payer: BC Managed Care – PPO | Admitting: Occupational Therapy

## 2020-12-06 ENCOUNTER — Encounter: Payer: Self-pay | Admitting: Rehabilitation

## 2020-12-06 ENCOUNTER — Ambulatory Visit: Payer: BC Managed Care – PPO | Admitting: Rehabilitation

## 2020-12-06 DIAGNOSIS — R2681 Unsteadiness on feet: Secondary | ICD-10-CM | POA: Diagnosis not present

## 2020-12-06 DIAGNOSIS — M6281 Muscle weakness (generalized): Secondary | ICD-10-CM | POA: Diagnosis not present

## 2020-12-06 DIAGNOSIS — I69354 Hemiplegia and hemiparesis following cerebral infarction affecting left non-dominant side: Secondary | ICD-10-CM

## 2020-12-06 DIAGNOSIS — R41840 Attention and concentration deficit: Secondary | ICD-10-CM | POA: Diagnosis not present

## 2020-12-06 DIAGNOSIS — R278 Other lack of coordination: Secondary | ICD-10-CM

## 2020-12-06 DIAGNOSIS — R208 Other disturbances of skin sensation: Secondary | ICD-10-CM | POA: Diagnosis not present

## 2020-12-06 NOTE — Therapy (Signed)
[facility] [address] [place], [place], [address] Phone: [phone]   Fax:  [phone]  Physical Therapy Treatment  Patient Details  Name: Shannon Garza MRN: [id number] Date of Birth: 1966-05-16 Referring Provider (PT): Sula Soda MD, and Dr. Pearlean Brownie   Encounter Date: 12/06/2020   PT End of Session - 12/06/20 1257    Visit Number 6    Number of Visits 9    Date for PT Re-Evaluation 01/10/21    Authorization Type BCBS    Progress Note Due on Visit 10    PT Start Time 1101    PT Stop Time 1145    PT Time Calculation (min) 44 min    Equipment Utilized During Treatment Other (comment);Gait belt    Activity Tolerance Patient tolerated treatment well    Behavior During Therapy [facility] for tasks assessed/performed           Past Medical History:  Diagnosis Date  . Diabetes mellitus without complication (HCC)   . Hypertension     History reviewed. No pertinent surgical history.  There were no vitals filed for this visit.   Subjective Assessment - 12/06/20 1105    Subjective Pt reports doing well, has not checked BP this morning.    Pertinent History Hx of CVA in 2017, DM, HTN    How long can you sit comfortably? unlimited    Patient Stated Goals Improve balance, work on floor transfers so she can get up off floor if she falls, get use of hand back.    Currently in Pain? No/denies                             [facility] Adult PT Treatment/Exercise - 12/06/20 1108      High Level Balance   High Level Balance Activities Backward walking;Direction changes;Turns;Sudden stops;Head turns;Marching forwards;Marching backwards    High Level Balance Comments Performed gait x 200' perfomring high level balance tasks listed above.  Pt with single overt LOB which required stepping strategy and min/guard for safety with sudden stop.      Neuro Re-ed    Neuro Re-ed Details  In // bars with one foot in  stance on 4" step with airex placed on top lifting opposite LE into march x 10 reps on each side, decreasing UE support throughout task, then switching feet x 10 more reps.  Progressed to same set up tapping opposite LE to cone lightly x 10 reps on each side with light UE support.  Ended on BOSU (black top up) with feet apart maintaining balance x 20 secs (2 sets) progressing to limits of stability with clockwise and counterclockwise movements x 10 reps each and finally performing mini squats without UE support (as able) x 10 reps.      Knee/Hip Exercises: Aerobic   Nustep BUE/LEs at level 6 x 8' maintaining rpms in the 60's throughout.  Tolerated well.                    PT Short Term Goals - 11/11/20 1345      PT SHORT TERM GOAL #1   Title same as LTG.             PT Long Term Goals - 11/15/20 0820      PT LONG TERM GOAL #1   Title Pt will be IND in HEP to improve balance, endurance, and strength. TARGET DATE FOR ALL LTGS:  12/09/20    Baseline HEP established    Time 4    Period Weeks    Status New      PT LONG TERM GOAL #2   Title Pt will improve DGI score to >/=20/24 to decr. falls risk.    Time 4    Period Weeks    Status New      PT LONG TERM GOAL #3   Title Pt will perform BERG and PT will write goal as indicated.    Baseline Initial BERG score 51, no goal needed    Time 4    Period Weeks    Status New      PT LONG TERM GOAL #4   Title Pt will report no falls in last 3 weeks to improve safety.    Time 4    Period Weeks    Status New      PT LONG TERM GOAL #5   Title Pt will improve TUG time to </=13 sec. to decr. falls risk.    Baseline 13.88 sec.    Time 4    Period Weeks    Status New      PT LONG TERM GOAL #6   Title Pt will amb. 500' over even and paved surfaces IND to improve functional mobility.    Time 4    Period Weeks    Status New                 Plan - 12/06/20 1257    Clinical Impression Statement Skilled session focused  on generalized strength and conditioning on nustep which she tolerated an increase in time and resistance and also focused on high level balance tasks in // bars and during gait. Pt with only single LOB but was able to self corrrect with stepping strategy and min/guard for safety.    Personal Factors and Comorbidities Comorbidity 3+;Fitness;Past/Current Experience;Transportation    Comorbidities Hx of CVA in 2017, DM, HTN    Examination-Activity Limitations Bed Mobility;Carry;Dressing;Stand;Stairs;Locomotion Level;Transfers    Examination-Participation Restrictions Cleaning;Driving;Laundry;Occupation    Stability/Clinical Decision Making Evolving/Moderate complexity   BP elevated   Rehab Potential Good    PT Frequency 2x / week    PT Duration 4 weeks    PT Treatment/Interventions ADLs/Self Care Home Management;Biofeedback;Aquatic Therapy;DME Instruction;Gait training;Stair training;Functional mobility training;Therapeutic activities;Therapeutic exercise;Balance training;Neuromuscular re-education;Orthotic Fit/Training;Patient/family education;Vestibular    PT Next Visit Plan MONITOR BP. assess floor transfers as able, continue balance and stepping tasks, assess stability on compliant usrfaces    PT Home Exercise Plan [id number]    Consulted and Agree with Plan of Care Patient           Patient will benefit from skilled therapeutic intervention in order to improve the following deficits and impairments:  Abnormal gait,Decreased strength,Impaired sensation,Postural dysfunction,Decreased balance,Decreased knowledge of use of DME,Impaired flexibility,Decreased endurance,Obesity  Visit Diagnosis: Unsteadiness on feet  Muscle weakness (generalized)     Problem List Patient Active Problem List   Diagnosis Date Noted  . CVA (cerebral vascular accident) (HCC) 10/18/2020  . Primary hypertension   . Left arm weakness   . Left leg weakness   . Subcortical infarction [facility]) 10/12/2020    Harriet Butte, PT, MPT [facility] [address] [place], [place], [address] Phone: [phone]   Fax:  [phone] 12/06/20, 1:00 PM  Name: Shannon Garza MRN: [id number] Date of Birth: 09/01/1965

## 2020-12-06 NOTE — Patient Instructions (Signed)
Cranial Flexion: Overhead Arm Extension - Supine (Medicine Ball) ? ? ? ?Lie with knees bent, arms beyond head, holding paper towel roll. Pull roll up to above face. ?Repeat _10_ times per set. Do _3 sets per session. Do _5-7_ sessions per week. ? ?ROM: Horizontal Abduction / Adduction - Wand ? ? ? ?Keeping hands on either side of the paper towel roll. Then pull back across body, in both directions. Do not allow trunk to twist. Hold _5_ seconds. ?Repeat _10_ times per set. Do _3_ sets per session. Do _5-7_ sessions per week. ? ?Supine: Chest Press (Active) ? ? ? ?Lie on back with arms fully extended and hands on either side of the paper towel roll. Lower roll slowly to chest and press to arm's length.  ?Complete _3__ sets of _10__ repetitions. Perform  5-7___ sessions per week ? ? ? ?Copyright ? VHI. All rights reserved.   ?

## 2020-12-06 NOTE — Therapy (Signed)
[facility] [address] [place], [place], [address] Phone: [phone]   Fax:  [phone]  Occupational Therapy Treatment  Patient Details  Name: Shannon Garza MRN: [id number] Date of Birth: [birth year] Referring Provider (OT): Charlotte Crumb   Encounter Date: 12/06/2020   OT End of Session - 12/06/20 1150    Visit Number 3    Number of Visits 17    Date for OT Re-Evaluation 01/25/21    Authorization Type BCBS;  VL:[place]    Authorization Time Period --    OT Start Time 1150    OT Stop Time 1230    OT Time Calculation (min) 40 min    Activity Tolerance Patient tolerated treatment well    Behavior During Therapy [facility] for tasks assessed/performed           Past Medical History:  Diagnosis Date  . Diabetes mellitus without complication (HCC)   . Hypertension     History reviewed. No pertinent surgical history.  There were no vitals filed for this visit.   Subjective Assessment - 12/06/20 1150    Subjective  Pt reports some sore pain in the LUE shoulder.    Patient Stated Goals Use of left hand    Currently in Pain? Yes    Pain Score 4     Pain Location Shoulder    Pain Orientation Left    Pain Descriptors / Indicators Sore    Pain Type Acute pain    Pain Onset More than a month ago    Pain Frequency Intermittent    Aggravating Factors  in the morning              TREATMENT:  Supine closed chain - see pt instructions Hand Gripper: with LUE on level 2 with black spring. Pt picked up 1 inch blocks with gripper with mod drops and mod difficulty. Pt took several breaks d/t fatigue and weakness in LUE. Pt reports she has yellow theraputty from CIR as HEP and reports using it. Pt has not been doing coordination HEP exercises. ADLs: Pt reports cutting vegetables (carrots, onions, bell peppers, avocado) and working on cooking more. Grooved Pegs: with LUE with min cues to not use RUE for rotating pegs and only  using LUE. Pt with mod difficulty and req'd increased time. Pt with mod drops and max difficulty picking up pegs dropped on the floor - no LOB but difficulty bending down and picking up with LUE.         OT Education - 12/06/20 1205    Education Details Supine Foam Roll - see pt instructions    Person(s) Educated Patient    Methods Demonstration;Explanation    Comprehension Verbalized understanding;Returned demonstration            OT Short Term Goals - 12/06/20 1213      OT SHORT TERM GOAL #1   Title Patient will complete an HEP designed to improve LUE proximal active movement    Time 4    Period Weeks    Status On-going    Target Date 12/26/20      OT SHORT TERM GOAL #2   Title Patient will tie shoes with increased time using BUE    Time 4    Period Weeks    Status On-going      OT SHORT TERM GOAL #3   Title Patient will utilize BUE to wring out washcloth effectively.    Time 4    Period Weeks  Status Achieved      OT SHORT TERM GOAL #4   Title Patient will hold phone in left hand to allow her to send full  text with right hand    Time 4    Period Weeks    Status On-going   pt reports holding it but is having drops            OT Long Term Goals - 11/25/20 1123      OT LONG TERM GOAL #1   Title Patient will complete an updated HEP designed to improve functional use of LUE with improved coordination, range of motion and strength    Time 8    Period Weeks    Status New      OT LONG TERM GOAL #2   Title Patient will complete 9 hole peg test in less than 2 min with LUE to help pick up and maneuver small items    Baseline over 3 min    Time 8    Period Weeks    Status New      OT LONG TERM GOAL #3   Title Patient will don/doff bra with modified independence    Time 8    Period Weeks    Status On-going   pt reports doing it and it is getting easier.     OT LONG TERM GOAL #4   Title Patient will utilize LUE to wash right side of body without  assistance    Time 8    Period Weeks    Status Achieved      OT LONG TERM GOAL #5   Title Patient will safely cut fruit using BUE method    Time 8    Period Weeks    Status On-going   pt reports cutting up vegetables     OT LONG TERM GOAL #6   Title Foto score at discharge    Time 8    Period Weeks    Status New                 Plan - 12/06/20 1209    Clinical Impression Statement Pt continues to progress towards unmet goals.    OT Occupational Profile and History Detailed Assessment- Review of Records and additional review of physical, cognitive, psychosocial history related to current functional performance    Occupational performance deficits (Please refer to evaluation for details): ADL's;IADL's;Work    Body Structure / Function / Physical Skills ADL;Coordination;Endurance;GMC;UE functional use;Balance;Decreased knowledge of precautions;Sensation;Vestibular;Vision;Pain;IADL;Flexibility;Decreased knowledge of use of DME;Body mechanics;Cardiopulmonary status limiting activity;Dexterity;FMC;Strength;Tone;ROM;Mobility;Edema    Cognitive Skills Attention;Safety Awareness;Problem Solve;Sequencing    Rehab Potential Good    Clinical Decision Making Several treatment options, min-mod task modification necessary    Comorbidities Affecting Occupational Performance: May have comorbidities impacting occupational performance    Modification or Assistance to Complete Evaluation  No modification of tasks or assist necessary to complete eval    OT Frequency 2x / week    OT Duration 8 weeks    OT Treatment/Interventions Self-care/ADL training;Electrical Stimulation;Therapeutic exercise;Visual/perceptual remediation/compensation;Aquatic Therapy;Neuromuscular education;Splinting;Patient/family education;Building services engineer;Therapeutic activities;Balance training;DME and/or AE instruction;Manual Therapy;Cognitive remediation/compensation    Plan review HEPs and update PRN,  coordination and LUE use, arm bike?    Consulted and Agree with Plan of Care Patient           Patient will benefit from skilled therapeutic intervention in order to improve the following deficits and impairments:   Body Structure / Function / Physical Skills: ADL,Coordination,Endurance,GMC,UE functional  use,Balance,Decreased knowledge of precautions,Sensation,Vestibular,Vision,Pain,IADL,Flexibility,Decreased knowledge of use of DME,Body mechanics,Cardiopulmonary status limiting activity,Dexterity,FMC,Strength,Tone,ROM,Mobility,Edema Cognitive Skills: Attention,Safety Awareness,Problem Solve,Sequencing     Visit Diagnosis: Muscle weakness (generalized)  Other lack of coordination  Hemiplegia and hemiparesis following cerebral infarction affecting left non-dominant side (HCC)  Attention and concentration deficit  Unsteadiness on feet    Problem List Patient Active Problem List   Diagnosis Date Noted  . CVA (cerebral vascular accident) (HCC) 10/18/2020  . Primary hypertension   . Left arm weakness   . Left leg weakness   . Subcortical infarction [facility]) 10/12/2020    Junious Dresser MOT, OTR/L  12/06/2020, 12:20 PM  [facility] [address] [place], [place], [address] Phone: [phone]   Fax:  [phone]  Name: Sevannah Madia MRN: [id number] Date of Birth: [birth year]

## 2020-12-08 ENCOUNTER — Encounter: Payer: Self-pay | Admitting: Occupational Therapy

## 2020-12-08 ENCOUNTER — Encounter: Payer: BC Managed Care – PPO | Admitting: Occupational Therapy

## 2020-12-08 ENCOUNTER — Ambulatory Visit: Payer: BC Managed Care – PPO | Admitting: Occupational Therapy

## 2020-12-08 ENCOUNTER — Other Ambulatory Visit: Payer: Self-pay

## 2020-12-08 ENCOUNTER — Ambulatory Visit: Payer: BC Managed Care – PPO

## 2020-12-08 VITALS — BP 168/88

## 2020-12-08 DIAGNOSIS — M6281 Muscle weakness (generalized): Secondary | ICD-10-CM

## 2020-12-08 DIAGNOSIS — I69354 Hemiplegia and hemiparesis following cerebral infarction affecting left non-dominant side: Secondary | ICD-10-CM | POA: Diagnosis not present

## 2020-12-08 DIAGNOSIS — R41840 Attention and concentration deficit: Secondary | ICD-10-CM | POA: Diagnosis not present

## 2020-12-08 DIAGNOSIS — R278 Other lack of coordination: Secondary | ICD-10-CM

## 2020-12-08 DIAGNOSIS — R208 Other disturbances of skin sensation: Secondary | ICD-10-CM

## 2020-12-08 DIAGNOSIS — R2681 Unsteadiness on feet: Secondary | ICD-10-CM | POA: Diagnosis not present

## 2020-12-08 NOTE — Therapy (Signed)
[facility] [address] [place], [place], [address] Phone: [phone]   Fax:  [phone]  Physical Therapy Treatment  Patient Details  Name: Shannon Garza MRN: [id number] Date of Birth: September 05, 1965 Referring Provider (PT): Sula Soda MD, and Dr. Pearlean Brownie   Encounter Date: 12/08/2020   PT End of Session - 12/08/20 1111    Visit Number 7    Number of Visits 9    Date for PT Re-Evaluation 01/10/21    Authorization Type BCBS    Progress Note Due on Visit 10    PT Start Time 1100    PT Stop Time 1145    PT Time Calculation (min) 45 min    Equipment Utilized During Treatment Other (comment);Gait belt    Activity Tolerance Patient tolerated treatment well    Behavior During Therapy [facility] for tasks assessed/performed           Past Medical History:  Diagnosis Date  . Diabetes mellitus without complication (HCC)   . Hypertension     No past surgical history on file.  Vitals:   12/08/20 1109  BP: (!) 168/88     Subjective Assessment - 12/08/20 1103    Subjective no new c/o, did not check BP today and took meds later than usual    Pertinent History Hx of CVA in 2017, DM, HTN    How long can you sit comfortably? unlimited    How long can you stand comfortably? <10 min    Patient Stated Goals Improve balance, work on floor transfers so she can get up off floor if she falls, get use of hand back.    Currently in Pain? No/denies    Pain Onset More than a month ago                             [facility] Adult PT Treatment/Exercise - 12/08/20 0001      Transfers   Transfers Sit to Stand    Sit to Stand 4: Min guard    Five time sit to stand comments  10x arms crossed from airex no UE, then tanderm R foot fwd 10x w/o UE support      Knee/Hip Exercises: Aerobic   Nustep L3 arms 10 x8'               Balance Exercises - 12/08/20 0001      Balance Exercises: Standing   Tandem Stance  Eyes open;Foam/compliant surface;1 rep;4 reps;30 secs;Limitations    Tandem Stance Time 30s hold EO in ea.position followed by head turns/nods 10x, no need of UE support    Step Ups Forward;Lateral;4 inch;UE support 1;Limitations    Step Ups Limitations performed at stepovers in both fwd/bwd directions single UE support 10 full stepovers per LE               PT Short Term Goals - 11/11/20 1345      PT SHORT TERM GOAL #1   Title same as LTG.             PT Long Term Goals - 11/15/20 0820      PT LONG TERM GOAL #1   Title Pt will be IND in HEP to improve balance, endurance, and strength. TARGET DATE FOR ALL LTGS: 12/09/20    Baseline HEP established    Time 4    Period Weeks    Status New      PT  LONG TERM GOAL #2   Title Pt will improve DGI score to >/=20/24 to decr. falls risk.    Time 4    Period Weeks    Status New      PT LONG TERM GOAL #3   Title Pt will perform BERG and PT will write goal as indicated.    Baseline Initial BERG score 51, no goal needed    Time 4    Period Weeks    Status New      PT LONG TERM GOAL #4   Title Pt will report no falls in last 3 weeks to improve safety.    Time 4    Period Weeks    Status New      PT LONG TERM GOAL #5   Title Pt will improve TUG time to </=13 sec. to decr. falls risk.    Baseline 13.88 sec.    Time 4    Period Weeks    Status New      PT LONG TERM GOAL #6   Title Pt will amb. 500' over even and paved surfaces IND to improve functional mobility.    Time 4    Period Weeks    Status New                 Plan - 12/08/20 1113    Clinical Impression Statement arrives today w/o taking BP, taken in clinic and found to be elevated, rechecked throughout session and treament modified to limit exertion/stress, continued strength and balance training mindful of elevated BP    Personal Factors and Comorbidities Comorbidity 3+;Fitness;Past/Current Experience;Transportation    Comorbidities Hx of CVA in 2017,  DM, HTN    Examination-Activity Limitations Bed Mobility;Carry;Dressing;Stand;Stairs;Locomotion Level;Transfers    Examination-Participation Restrictions Cleaning;Driving;Laundry;Occupation    Stability/Clinical Decision Making Evolving/Moderate complexity   BP elevated   Rehab Potential Good    PT Frequency 2x / week    PT Duration 4 weeks    PT Treatment/Interventions ADLs/Self Care Home Management;Biofeedback;Aquatic Therapy;DME Instruction;Gait training;Stair training;Functional mobility training;Therapeutic activities;Therapeutic exercise;Balance training;Neuromuscular re-education;Orthotic Fit/Training;Patient/family education;Vestibular    PT Next Visit Plan MONITOR BP. assess floor transfers as BP allows, continue balance and stepping tasks, assess stability on compliant usrfaces with gait tasks    PT Home Exercise Plan [id number]    Consulted and Agree with Plan of Care Patient           Patient will benefit from skilled therapeutic intervention in order to improve the following deficits and impairments:  Abnormal gait,Decreased strength,Impaired sensation,Postural dysfunction,Decreased balance,Decreased knowledge of use of DME,Impaired flexibility,Decreased endurance,Obesity  Visit Diagnosis: Unsteadiness on feet  Muscle weakness (generalized)  Hemiplegia and hemiparesis following cerebral infarction affecting left non-dominant side [facility])     Problem List Patient Active Problem List   Diagnosis Date Noted  . CVA (cerebral vascular accident) (HCC) 10/18/2020  . Primary hypertension   . Left arm weakness   . Left leg weakness   . Subcortical infarction (HCC) 10/12/2020    Hildred Laser PT 12/08/2020, 2:55 PM  [facility] [address] [place], [place], [address] Phone: [phone]   Fax:  [phone]  Name: Adalind Weitz MRN: [id number] Date of Birth: [birth year]

## 2020-12-08 NOTE — Therapy (Signed)
[facility] [address] [place], [place], [address] Phone: [phone]   Fax:  [phone]  Occupational Therapy Treatment  Patient Details  Name: Shannon Garza MRN: [id number] Date of Birth: November 24, 1965 Referring Provider (OT): Maxwell Caul   Encounter Date: 12/08/2020   OT End of Session - 12/08/20 1251    Visit Number 4    Number of Visits 17    Date for OT Re-Evaluation 01/25/21    Authorization Type BCBS;  VL:[place]    OT Start Time 1145    OT Stop Time 1232    OT Time Calculation (min) 47 min    Activity Tolerance Patient tolerated treatment well    Behavior During Therapy [facility] for tasks assessed/performed           Past Medical History:  Diagnosis Date  . Diabetes mellitus without complication ([place])   . Hypertension     History reviewed. No pertinent surgical history.  There were no vitals filed for this visit.   Subjective Assessment - 12/08/20 1150    Subjective  I got my bra on by myself today!    Patient Stated Goals Use of left hand    Currently in Pain? No/denies    Pain Score 0-No pain                        OT Treatments/Exercises (OP) - 12/08/20 1236      ADLs   UB Dressing Patient able to don her bra today for the first time.  Patient    LB Dressing Patient able to tie shoes using BUE this morning.      Neurological Re-education Exercises   Other Exercises 1 Neuromuscular reeducation to address mid to high supported reach.  Patient with consistent pain in deltoid inssertion region with attempts at open chain overhead reach.  Patient with tendency to elevate shoulder and pull humeral head anteriorly with attempts to reach.  Started supine rolling onto left arm, and using left arm as active support and assist for supine to sit.  Using sliding or assisted up to allow humeral head to drop down.  With support she could reach overhead without pain.                  OT  Education - 12/08/20 1250    Education Details slide versus lift arm upward    Person(s) Educated Patient    Methods Explanation;Demonstration    Comprehension Need further instruction            OT Short Term Goals - 12/08/20 1231      OT SHORT TERM GOAL #1   Title Patient will complete an HEP designed to improve LUE proximal active movement    Time 4    Period Weeks    Status On-going    Target Date 12/26/20      OT SHORT TERM GOAL #2   Title Patient will tie shoes with increased time using BUE    Time 4    Period Weeks    Status Achieved      OT SHORT TERM GOAL #3   Title Patient will utilize BUE to wring out washcloth effectively.    Time 4    Period Weeks    Status Achieved      OT SHORT TERM GOAL #4   Title Patient will hold phone in left hand to allow her to send full  text with right hand  Time 4    Period Weeks    Status Achieved   pt reports holding it but is having drops            OT Long Term Goals - 12/08/20 1252      OT LONG TERM GOAL #1   Title Patient will complete an updated HEP designed to improve functional use of LUE with improved coordination, range of motion and strength    Time 8    Period Weeks    Status New      OT LONG TERM GOAL #2   Title Patient will complete 9 hole peg test in less than 2 min with LUE to help pick up and maneuver small items    Baseline over 3 min    Time 8    Period Weeks    Status New      OT LONG TERM GOAL #3   Title Patient will don/doff bra with modified independence    Time 8    Period Weeks    Status On-going   pt reports doing it and it is getting easier.     OT LONG TERM GOAL #4   Title Patient will utilize LUE to wash right side of body without assistance    Time 8    Period Weeks    Status Achieved      OT LONG TERM GOAL #5   Title Patient will safely cut fruit using BUE method    Time 8    Period Weeks    Status On-going   pt reports cutting up vegetables     OT LONG TERM GOAL #6    Title Foto score at discharge    Time 8    Period Weeks    Status New                 Plan - 12/08/20 1251    Clinical Impression Statement Pt has met short term goals and is workign toward long term goals    OT Occupational Profile and History Detailed Assessment- Review of Records and additional review of physical, cognitive, psychosocial history related to current functional performance    Occupational performance deficits (Please refer to evaluation for details): ADL's;IADL's;Work    Body Structure / Function / Physical Skills ADL;Coordination;Endurance;GMC;UE functional use;Balance;Decreased knowledge of precautions;Sensation;Vestibular;Vision;Pain;IADL;Flexibility;Decreased knowledge of use of DME;Body mechanics;Cardiopulmonary status limiting activity;Dexterity;FMC;Strength;Tone;ROM;Mobility;Edema    Cognitive Skills Attention;Safety Awareness;Problem Solve;Sequencing    Rehab Potential Good    Clinical Decision Making Several treatment options, min-mod task modification necessary    Comorbidities Affecting Occupational Performance: May have comorbidities impacting occupational performance    Modification or Assistance to Complete Evaluation  No modification of tasks or assist necessary to complete eval    OT Frequency 2x / week    OT Duration 8 weeks    OT Treatment/Interventions Self-care/ADL training;Electrical Stimulation;Therapeutic exercise;Visual/perceptual remediation/compensation;Aquatic Therapy;Neuromuscular education;Splinting;Patient/family education;Therapist, nutritional;Therapeutic activities;Balance training;DME and/or AE instruction;Manual Therapy;Cognitive remediation/compensation    Plan NMR LUE - mid to high supported reach, distal LUE coordination    OT Home Exercise Plan has supine shoulder exercises    Consulted and Agree with Plan of Care Patient           Patient will benefit from skilled therapeutic intervention in order to improve the  following deficits and impairments:   Body Structure / Function / Physical Skills: ADL,Coordination,Endurance,GMC,UE functional use,Balance,Decreased knowledge of precautions,Sensation,Vestibular,Vision,Pain,IADL,Flexibility,Decreased knowledge of use of DME,Body mechanics,Cardiopulmonary status limiting activity,Dexterity,FMC,Strength,Tone,ROM,Mobility,Edema Cognitive Skills: Attention,Safety Awareness,Problem Solve,Sequencing  Visit Diagnosis: Hemiplegia and hemiparesis following cerebral infarction affecting left non-dominant side (HCC)  Attention and concentration deficit  Other lack of coordination  Muscle weakness (generalized)  Other disturbances of skin sensation  Unsteadiness on feet    Problem List Patient Active Problem List   Diagnosis Date Noted  . CVA (cerebral vascular accident) ([place]) 10/18/2020  . Primary hypertension   . Left arm weakness   . Left leg weakness   . Subcortical infarction [facility]) 10/12/2020    Mariah Milling, OTR/L 12/08/2020, 12:53 PM  [facility] [address] [place], [place], [address] Phone: [phone]   Fax:  [phone]  Name: Shannon Garza MRN: [id number] Date of Birth: [birth year]

## 2020-12-13 ENCOUNTER — Ambulatory Visit: Payer: BC Managed Care – PPO | Admitting: Occupational Therapy

## 2020-12-13 ENCOUNTER — Other Ambulatory Visit: Payer: Self-pay

## 2020-12-13 ENCOUNTER — Encounter: Payer: Self-pay | Admitting: Occupational Therapy

## 2020-12-13 ENCOUNTER — Ambulatory Visit: Payer: BC Managed Care – PPO

## 2020-12-13 DIAGNOSIS — M6281 Muscle weakness (generalized): Secondary | ICD-10-CM

## 2020-12-13 DIAGNOSIS — I69354 Hemiplegia and hemiparesis following cerebral infarction affecting left non-dominant side: Secondary | ICD-10-CM | POA: Diagnosis not present

## 2020-12-13 DIAGNOSIS — R278 Other lack of coordination: Secondary | ICD-10-CM | POA: Diagnosis not present

## 2020-12-13 DIAGNOSIS — R208 Other disturbances of skin sensation: Secondary | ICD-10-CM | POA: Diagnosis not present

## 2020-12-13 DIAGNOSIS — R41840 Attention and concentration deficit: Secondary | ICD-10-CM

## 2020-12-13 DIAGNOSIS — R2681 Unsteadiness on feet: Secondary | ICD-10-CM

## 2020-12-13 NOTE — Patient Instructions (Signed)
  Coordination Activities  Perform the following activities for 20 minutes 2 times per day with left hand(s).   Flip cards 1 at a time as fast as you can.  Deal cards with your thumb (Hold deck in hand and push card off top with thumb).  Pick up coins, buttons, marbles, dried beans/pasta of different sizes and place in container.  Pick up coins and place in container or coin bank.  Pick up coins and stack.  Pick up coins one at a time until you get 5-10 in your hand, then move coins from palm to fingertips to stack one at a time.

## 2020-12-13 NOTE — Therapy (Signed)
[facility] [address] [place], [place], [address] Phone: [phone]   Fax:  [phone]  Occupational Therapy Treatment  Patient Details  Name: Shannon Garza MRN: [id number] Date of Birth: [birth year] Referring Provider (OT): Charlotte Crumb   Encounter Date: 12/13/2020   OT End of Session - 12/13/20 1151    Visit Number 5    Number of Visits 17    Date for OT Re-Evaluation 01/25/21    Authorization Type BCBS;  VL:MN    OT Start Time 1152    OT Stop Time 1230    OT Time Calculation (min) 38 min    Activity Tolerance Patient tolerated treatment well    Behavior During Therapy [facility] for tasks assessed/performed           Past Medical History:  Diagnosis Date  . Diabetes mellitus without complication (HCC)   . Hypertension     History reviewed. No pertinent surgical history.  There were no vitals filed for this visit.   Subjective Assessment - 12/13/20 1152    Subjective  Very minimal pain - woke up kind of stiff this morning.    Patient Stated Goals Use of left hand    Currently in Pain? Yes    Pain Score 3     Pain Location Hand    Pain Orientation Left    Pain Descriptors / Indicators Tightness    Pain Type Acute pain    Pain Onset Today    Pain Frequency Rarely    Aggravating Factors  in the morning - "may be the weather"             Small Pegs in standing reaching with LUE to approximately eye level with LUE with min cues for positioning of shoulder. Pt with moderate drops of small pegs from LUE and with mod difficulty with in hand manipulation for rotating of pegs. Min cues for following/copying pattern. No LOB with picking up pegs from floor. Pt with increased drops with OT engaging in conversation with pt.  Flipping Cards, Pennies for working on coordination with LUE.                    OT Education - 12/13/20 1224    Education Details Coordination HEP    Person(s) Educated  Patient    Methods Explanation;Demonstration    Comprehension Verbalized understanding;Returned demonstration            OT Short Term Goals - 12/13/20 1152      OT SHORT TERM GOAL #1   Title Patient will complete an HEP designed to improve LUE proximal active movement    Time 4    Period Weeks    Status On-going    Target Date 12/26/20      OT SHORT TERM GOAL #2   Title Patient will tie shoes with increased time using BUE    Time 4    Period Weeks    Status Achieved      OT SHORT TERM GOAL #3   Title Patient will utilize BUE to wring out washcloth effectively.    Time 4    Period Weeks    Status Achieved      OT SHORT TERM GOAL #4   Title Patient will hold phone in left hand to allow her to send full  text with right hand    Time 4    Period Weeks    Status Achieved   pt reports holding it  but is having drops            OT Long Term Goals - 12/13/20 1152      OT LONG TERM GOAL #1   Title Patient will complete an updated HEP designed to improve functional use of LUE with improved coordination, range of motion and strength    Time 8    Period Weeks    Status On-going      OT LONG TERM GOAL #2   Title Patient will complete 9 hole peg test in less than 2 min with LUE to help pick up and maneuver small items    Baseline over 3 min    Time 8    Period Weeks    Status On-going      OT LONG TERM GOAL #3   Title Patient will don/doff bra with modified independence    Time 8    Period Weeks    Status Achieved   pt reports doing it and it is getting easier.     OT LONG TERM GOAL #4   Title Patient will utilize LUE to wash right side of body without assistance    Time 8    Period Weeks    Status Achieved      OT LONG TERM GOAL #5   Title Patient will safely cut fruit using BUE method    Time 8    Period Weeks    Status On-going   pt reports cutting up vegetables     OT LONG TERM GOAL #6   Title Foto score at discharge    Time 8    Period Weeks     Status New                 Plan - 12/13/20 1215    Clinical Impression Statement Pt continues to progress towards LTGs.    OT Occupational Profile and History Detailed Assessment- Review of Records and additional review of physical, cognitive, psychosocial history related to current functional performance    Occupational performance deficits (Please refer to evaluation for details): ADL's;IADL's;Work    Body Structure / Function / Physical Skills ADL;Coordination;Endurance;GMC;UE functional use;Balance;Decreased knowledge of precautions;Sensation;Vestibular;Vision;Pain;IADL;Flexibility;Decreased knowledge of use of DME;Body mechanics;Cardiopulmonary status limiting activity;Dexterity;FMC;Strength;Tone;ROM;Mobility;Edema    Cognitive Skills Attention;Safety Awareness;Problem Solve;Sequencing    Rehab Potential Good    Clinical Decision Making Several treatment options, min-mod task modification necessary    Comorbidities Affecting Occupational Performance: May have comorbidities impacting occupational performance    Modification or Assistance to Complete Evaluation  No modification of tasks or assist necessary to complete eval    OT Frequency 2x / week    OT Duration 8 weeks    OT Treatment/Interventions Self-care/ADL training;Electrical Stimulation;Therapeutic exercise;Visual/perceptual remediation/compensation;Aquatic Therapy;Neuromuscular education;Splinting;Patient/family education;Building services engineer;Therapeutic activities;Balance training;DME and/or AE instruction;Manual Therapy;Cognitive remediation/compensation    Plan NMR LUE - mid to high supported reach, distal LUE coordination    OT Home Exercise Plan has supine shoulder exercises    Consulted and Agree with Plan of Care Patient           Patient will benefit from skilled therapeutic intervention in order to improve the following deficits and impairments:   Body Structure / Function / Physical Skills:  ADL,Coordination,Endurance,GMC,UE functional use,Balance,Decreased knowledge of precautions,Sensation,Vestibular,Vision,Pain,IADL,Flexibility,Decreased knowledge of use of DME,Body mechanics,Cardiopulmonary status limiting activity,Dexterity,FMC,Strength,Tone,ROM,Mobility,Edema Cognitive Skills: Attention,Safety Awareness,Problem Solve,Sequencing     Visit Diagnosis: Hemiplegia and hemiparesis following cerebral infarction affecting left non-dominant side (HCC)  Attention and concentration deficit  Other lack of  coordination  Muscle weakness (generalized)  Other disturbances of skin sensation    Problem List Patient Active Problem List   Diagnosis Date Noted  . CVA (cerebral vascular accident) (HCC) 10/18/2020  . Primary hypertension   . Left arm weakness   . Left leg weakness   . Subcortical infarction [facility]) 10/12/2020    Junious Dresser MOT, OTR/L  12/13/2020, 12:25 PM  [facility] [address] [place], [place], [address] Phone: [phone]   Fax:  [phone]  Name: Shannon Garza MRN: [id number] Date of Birth: 08/15/1966

## 2020-12-13 NOTE — Therapy (Signed)
[facility] [address] [place], [place], [address] Phone: [phone]   Fax:  [phone]  Physical Therapy Treatment  Patient Details  Name: Shannon Garza MRN: [id number] Date of Birth: 05/14/1966 Referring Provider (PT): Leeroy Cha MD, and Dr. Leonie Man   Encounter Date: 12/13/2020   PT End of Session - 12/13/20 1155    Visit Number 8    Number of Visits 9    Date for PT Re-Evaluation 01/10/21    Authorization Type BCBS    Progress Note Due on Visit 10    PT Start Time 1105    PT Stop Time 1145    PT Time Calculation (min) 40 min    Activity Tolerance Patient tolerated treatment well    Behavior During Therapy [facility] for tasks assessed/performed           Past Medical History:  Diagnosis Date  . Diabetes mellitus without complication ([place])   . Hypertension     History reviewed. No pertinent surgical history.  There were no vitals filed for this visit.   Subjective Assessment - 12/13/20 1109    Subjective Doing well, rates herself at 80%, BP taken earlier 144/88              [facility] PT Assessment - 12/13/20 0001      Transfers   Transfers Sit to Stand    Sit to Stand 5: Supervision    Five time sit to stand comments  15.38s      Dynamic Gait Index   Level Surface Normal    Change in Gait Speed Mild Impairment    Gait with Horizontal Head Turns Normal    Gait with Vertical Head Turns Normal    Gait and Pivot Turn Mild Impairment    Step Over Obstacle Normal    Step Around Obstacles Normal    Steps Mild Impairment    Total Score 21      Timed Up and Go Test   Normal TUG (seconds) 12.78      High Level Balance   High Level Balance Comments able to hold all 4 positions on MCTSIB for 30s                         [facility] Adult PT Treatment/Exercise - 12/13/20 0001      Transfers   Floor to Transfer 4: Min guard;With upper extremity assist;From chair/3-in-1       Ambulation/Gait   Ambulation/Gait Yes    Ambulation/Gait Assistance 5: Supervision    Ambulation/Gait Assistance Details no need of AD    Ambulation Distance (Feet) 500 Feet    Assistive device None    Gait Pattern Step-through pattern    Ambulation Surface Level;Indoor          See LTGs          PT Short Term Goals - 11/11/20 1345      PT SHORT TERM GOAL #1   Title same as LTG.             PT Long Term Goals - 12/13/20 1115      PT LONG TERM GOAL #1   Title Pt will be IND in HEP to improve balance, endurance, and strength. TARGET DATE FOR ALL LTGS: 12/09/20    Baseline HEP established; 12/13/20 Patient able to demo HEP back to PT    Time 4    Period Weeks    Status Achieved  PT LONG TERM GOAL #2   Title Pt will improve DGI score to >/=20/24 to decr. falls risk.    Baseline 12/13/20 DGI score 21/24    Time 4    Period Weeks    Status Achieved      PT LONG TERM GOAL #3   Title Pt will perform BERG and PT will write goal as indicated.    Baseline Initial BERG score 51, no goal needed    Time 4    Period Weeks    Status Achieved      PT LONG TERM GOAL #4   Title Pt will report no falls in last 3 weeks to improve safety.    Baseline No falls since start of OPPT    Time 4    Period Weeks    Status Achieved      PT LONG TERM GOAL #5   Title Pt will improve TUG time to </=13 sec. to decr. falls risk.    Baseline 13.88 sec.; 12/13/20 TUG time 12.78s    Time 4    Period Weeks    Status Achieved      PT LONG TERM GOAL #6   Title Pt will amb. 500' over even and paved surfaces IND to improve functional mobility.    Baseline 12/13/20 Able to ambulate 511f in clinic across level ground w/o need of AD, unable to ambulate outside due to rain    Time 4    Period Weeks    Status Partially Met                 Plan - 12/13/20 1155    Clinical Impression Statement Todays skilled session consisted of assessment of all goals as well as floor to stand  transfers, patient progressing towards all goals w/o issue, will conssier possible DC next session    Personal Factors and Comorbidities Comorbidity 3+;Fitness;Past/Current Experience;Transportation    Comorbidities Hx of CVA in 2017, DM, HTN    Examination-Activity Limitations Bed Mobility;Carry;Dressing;Stand;Stairs;Locomotion Level;Transfers    Examination-Participation Restrictions Cleaning;Driving;Laundry;Occupation    Stability/Clinical Decision Making Evolving/Moderate complexity   BP elevated   Rehab Potential Good    PT Frequency 2x / week    PT Duration 4 weeks    PT Treatment/Interventions ADLs/Self Care Home Management;Biofeedback;Aquatic Therapy;DME Instruction;Gait training;Stair training;Functional mobility training;Therapeutic activities;Therapeutic exercise;Balance training;Neuromuscular re-education;Orthotic Fit/Training;Patient/family education;Vestibular    PT Next Visit Plan Assess for DC and update HEP    PT Home Exercise Plan [id number]   Consulted and Agree with Plan of Care Patient           Patient will benefit from skilled therapeutic intervention in order to improve the following deficits and impairments:  Abnormal gait,Decreased strength,Impaired sensation,Postural dysfunction,Decreased balance,Decreased knowledge of use of DME,Impaired flexibility,Decreased endurance,Obesity  Visit Diagnosis: Unsteadiness on feet  Hemiplegia and hemiparesis following cerebral infarction affecting left non-dominant side (HCC)  Muscle weakness (generalized)     Problem List Patient Active Problem List   Diagnosis Date Noted  . [place]  . Primary hypertension   . Left arm weakness   . Left leg weakness   . Subcortical infarction ([facility] 10/12/2020    JLanice Shirts4/18/2022, 2:34 PM  [place] [address]Phone: [phone]   Fax:  [phone] Name: Shannon SegundoMRN: [id number] of Birth: 910/13/1967

## 2020-12-15 ENCOUNTER — Encounter: Payer: BC Managed Care – PPO | Admitting: Occupational Therapy

## 2020-12-15 ENCOUNTER — Ambulatory Visit: Payer: BC Managed Care – PPO

## 2020-12-16 ENCOUNTER — Ambulatory Visit: Payer: BC Managed Care – PPO | Admitting: Occupational Therapy

## 2020-12-16 ENCOUNTER — Encounter: Payer: Self-pay | Admitting: Occupational Therapy

## 2020-12-16 ENCOUNTER — Ambulatory Visit: Payer: BC Managed Care – PPO

## 2020-12-16 ENCOUNTER — Other Ambulatory Visit: Payer: Self-pay

## 2020-12-16 DIAGNOSIS — M6281 Muscle weakness (generalized): Secondary | ICD-10-CM

## 2020-12-16 DIAGNOSIS — R2681 Unsteadiness on feet: Secondary | ICD-10-CM

## 2020-12-16 DIAGNOSIS — R208 Other disturbances of skin sensation: Secondary | ICD-10-CM

## 2020-12-16 DIAGNOSIS — R278 Other lack of coordination: Secondary | ICD-10-CM | POA: Diagnosis not present

## 2020-12-16 DIAGNOSIS — I69354 Hemiplegia and hemiparesis following cerebral infarction affecting left non-dominant side: Secondary | ICD-10-CM

## 2020-12-16 DIAGNOSIS — R41840 Attention and concentration deficit: Secondary | ICD-10-CM | POA: Diagnosis not present

## 2020-12-16 NOTE — Therapy (Signed)
[facility] [address] [place], [place], [address] Phone: [phone]   Fax:  [phone]  Physical Therapy Treatment/DC Summary  Patient Details  Name: Shannon Garza MRN: [id number] Date of Birth: 1965/09/27 Referring Provider (PT): Leeroy Cha MD, and Dr. Leonie Man   Encounter Date: 12/16/2020 PHYSICAL THERAPY DISCHARGE SUMMARY  Visits from Start of Care: 9  Current functional level related to goals / functional outcomes: Goals met, 80% self assessment of function   Remaining deficits: endurance   Education / Equipment: HEP Plan: Patient agrees to discharge.  Patient goals were met. Patient is being discharged due to meeting the stated rehab goals.  ?????       PT End of Session - 12/16/20 1123    Visit Number 9    Number of Visits 9    Date for PT Re-Evaluation 01/10/21    Authorization Type BCBS    PT Start Time 1100    PT Stop Time 1145    PT Time Calculation (min) 45 min    Equipment Utilized During Treatment Other (comment);Gait belt    Activity Tolerance Patient tolerated treatment well           Past Medical History:  Diagnosis Date  . Diabetes mellitus without complication ([place])   . Hypertension     History reviewed. No pertinent surgical history.  There were no vitals filed for this visit.   Subjective Assessment - 12/16/20 1118    Subjective Continues to do well, feels she can manage at home    Pertinent History Hx of CVA in 2017, DM, HTN    How long can you sit comfortably? unlimited    How long can you stand comfortably? <10 min    How long can you walk comfortably? <10 min    Patient Stated Goals Improve balance, work on floor transfers so she can get up off floor if she falls, get use of hand back.    Pain Onset Today                             [facility] Adult PT Treatment/Exercise - 12/16/20 0001      Transfers   Transfers Sit to Stand    Sit to Stand  5: Supervision    Comments perfomed 10x arms crossed      Ambulation/Gait   Stairs Yes    Stairs Assistance 5: Supervision    Stairs Assistance Details (indicate cue type and reason) step through single rail 16 steps    Stair Management Technique One rail Left;Alternating pattern;Forwards    Number of Stairs 16    Door Management 7: Independent                    PT Short Term Goals - 11/11/20 1345      PT SHORT TERM GOAL #1   Title same as LTG.             PT Long Term Goals - 12/16/20 1438      PT LONG TERM GOAL #1   Title Pt will be IND in HEP to improve balance, endurance, and strength. TARGET DATE FOR ALL LTGS: 12/09/20    Baseline HEP established; 12/13/20 Patient able to demo HEP back to PT    Time 4    Period Weeks    Status Achieved      PT LONG TERM GOAL #2   Title Pt will  improve DGI score to >/=20/24 to decr. falls risk.    Baseline 12/13/20 DGI score 21/24    Time 4    Period Weeks    Status Achieved      PT LONG TERM GOAL #3   Title Pt will perform BERG and PT will write goal as indicated.    Baseline Initial BERG score 51, no goal needed    Time 4    Period Weeks    Status Achieved      PT LONG TERM GOAL #4   Title Pt will report no falls in last 3 weeks to improve safety.    Baseline No falls since start of OPPT    Time 4    Period Weeks    Status Achieved      PT LONG TERM GOAL #5   Title Pt will improve TUG time to </=13 sec. to decr. falls risk.    Baseline 13.88 sec.; 12/13/20 TUG time 12.78s    Time 4    Period Weeks    Status Achieved      PT LONG TERM GOAL #6   Title Pt will amb. 500' over even and paved surfaces IND to improve functional mobility.    Baseline 12/13/20 Able to ambulate 564f in clinic across level ground w/o need of AD, unable to ambulate outside due to rain; 12/16/20 Ambulation 5032foutside w/o need of AD under S    Time 4    Period Weeks    Status Achieved                 Plan - 12/16/20 1124     Clinical Impression Statement Todays skilled session consisted of review of HEP and goal progress in preparation for DC    Personal Factors and Comorbidities Comorbidity 3+;Fitness;Past/Current Experience;Transportation    Comorbidities Hx of CVA in 2017, DM, HTN    Examination-Activity Limitations Bed Mobility;Carry;Dressing;Stand;Stairs;Locomotion Level;Transfers    Examination-Participation Restrictions Cleaning;Driving;Laundry;Occupation    Stability/Clinical Decision Making Evolving/Moderate complexity   BP elevated   Rehab Potential Good    PT Frequency 2x / week    PT Duration 4 weeks    PT Treatment/Interventions ADLs/Self Care Home Management;Biofeedback;Aquatic Therapy;DME Instruction;Gait training;Stair training;Functional mobility training;Therapeutic activities;Therapeutic exercise;Balance training;Neuromuscular re-education;Orthotic Fit/Training;Patient/family education;Vestibular    PT Next Visit Plan Assess for DC and update HEP    PT Home Exercise Plan [id number]  Consulted and Agree with Plan of Care Patient           Patient will benefit from skilled therapeutic intervention in order to improve the following deficits and impairments:  Abnormal gait,Decreased strength,Impaired sensation,Postural dysfunction,Decreased balance,Decreased knowledge of use of DME,Impaired flexibility,Decreased endurance,Obesity  Visit Diagnosis: Unsteadiness on feet  Hemiplegia and hemiparesis following cerebral infarction affecting left non-dominant side (HCC)  Muscle weakness (generalized)     Problem List Patient Active Problem List   Diagnosis Date Noted  . [place]  . Primary hypertension   . Left arm weakness   . Left leg weakness   . [place] infarction ([place]20[place]    JeLanice ShirtsT 12/16/2020, 2:42 PM  [place] [place] [id number]: [phone] Fax:  [phone]: Shannon HeyingRN: [id number] of Birth: 9/June 17, 1966

## 2020-12-16 NOTE — Therapy (Signed)
[facility] [address] [place], [place], [address] Phone: [phone]   Fax:  [phone]  Occupational Therapy Treatment  Patient Details  Name: Shannon Garza MRN: [id number] Date of Birth: Feb 10, 1966 Referring Provider (OT): Charlotte Crumb   Encounter Date: 12/16/2020   OT End of Session - 12/16/20 1228    Visit Number 6    Number of Visits 17    Date for OT Re-Evaluation 01/25/21    Authorization Type BCBS;  VL:[place]    OT Start Time 1141    OT Stop Time 1230    OT Time Calculation (min) 49 min    Activity Tolerance Patient tolerated treatment well    Behavior During Therapy [facility] for tasks assessed/performed           Past Medical History:  Diagnosis Date  . Diabetes mellitus without complication (HCC)   . Hypertension     History reviewed. No pertinent surgical history.  There were no vitals filed for this visit.   Subjective Assessment - 12/16/20 1143    Subjective  No pain    Patient Stated Goals Use of left hand    Currently in Pain? No/denies    Pain Score 0-No pain                        OT Treatments/Exercises (OP) - 12/16/20 1222      Neurological Re-education Exercises   Other Exercises 1 Neuromuscular reeducation to address mid to high assisted reach - modified closed chain.  Patient able to reach at shoulder to eye level with hand supported on long pole.  Worked on reaching with arm flexed to reduce lever arm, then sliding to higher ranges.  Worked within pain free ranges.  Followed with gentle mobilization at end range of shoulder flex/abd.  For coordiantion worked on following origami pattern.                  OT Education - 12/16/20 1228    Education Details Avoid shoulder movement that causes pain    Person(s) Educated Patient    Methods Explanation    Comprehension Verbalized understanding            OT Short Term Goals - 12/16/20 1229      OT SHORT TERM  GOAL #1   Title Patient will complete an HEP designed to improve LUE proximal active movement    Time 4    Period Weeks    Status Achieved    Target Date 12/26/20      OT SHORT TERM GOAL #2   Title Patient will tie shoes with increased time using BUE    Time 4    Period Weeks    Status Achieved      OT SHORT TERM GOAL #3   Title Patient will utilize BUE to wring out washcloth effectively.    Time 4    Period Weeks    Status Achieved      OT SHORT TERM GOAL #4   Title Patient will hold phone in left hand to allow her to send full  text with right hand    Time 4    Period Weeks    Status Achieved   pt reports holding it but is having drops            OT Long Term Goals - 12/16/20 1229      OT LONG TERM GOAL #1   Title Patient  will complete an updated HEP designed to improve functional use of LUE with improved coordination, range of motion and strength    Time 8    Period Weeks    Status On-going      OT LONG TERM GOAL #2   Title Patient will complete 9 hole peg test in less than 2 min with LUE to help pick up and maneuver small items    Baseline over 3 min    Time 8    Period Weeks    Status On-going      OT LONG TERM GOAL #3   Title Patient will don/doff bra with modified independence    Time 8    Period Weeks    Status Achieved   pt reports doing it and it is getting easier.     OT LONG TERM GOAL #4   Title Patient will utilize LUE to wash right side of body without assistance    Time 8    Period Weeks    Status Achieved      OT LONG TERM GOAL #5   Title Patient will safely cut fruit using BUE method    Time 8    Period Weeks    Status On-going   pt reports cutting up vegetables     OT LONG TERM GOAL #6   Title Foto score at discharge    Time 8    Period Weeks    Status New                 Plan - 12/16/20 1228    Clinical Impression Statement Pt showing continued improvement in functional use and timing in movement in LUE    OT  Occupational Profile and History Detailed Assessment- Review of Records and additional review of physical, cognitive, psychosocial history related to current functional performance    Occupational performance deficits (Please refer to evaluation for details): ADL's;IADL's;Work    Body Structure / Function / Physical Skills ADL;Coordination;Endurance;GMC;UE functional use;Balance;Decreased knowledge of precautions;Sensation;Vestibular;Vision;Pain;IADL;Flexibility;Decreased knowledge of use of DME;Body mechanics;Cardiopulmonary status limiting activity;Dexterity;FMC;Strength;Tone;ROM;Mobility;Edema    Cognitive Skills Attention;Safety Awareness;Problem Solve;Sequencing    Rehab Potential Good    Clinical Decision Making Several treatment options, min-mod task modification necessary    Comorbidities Affecting Occupational Performance: May have comorbidities impacting occupational performance    Modification or Assistance to Complete Evaluation  No modification of tasks or assist necessary to complete eval    OT Frequency 2x / week    OT Duration 8 weeks    OT Treatment/Interventions Self-care/ADL training;Electrical Stimulation;Therapeutic exercise;Visual/perceptual remediation/compensation;Aquatic Therapy;Neuromuscular education;Splinting;Patient/family education;Building services engineer;Therapeutic activities;Balance training;DME and/or AE instruction;Manual Therapy;Cognitive remediation/compensation    Plan NMR LUE - mid to high supported reach, distal LUE coordination    OT Home Exercise Plan has supine shoulder exercises    Consulted and Agree with Plan of Care Patient           Patient will benefit from skilled therapeutic intervention in order to improve the following deficits and impairments:   Body Structure / Function / Physical Skills: ADL,Coordination,Endurance,GMC,UE functional use,Balance,Decreased knowledge of precautions,Sensation,Vestibular,Vision,Pain,IADL,Flexibility,Decreased  knowledge of use of DME,Body mechanics,Cardiopulmonary status limiting activity,Dexterity,FMC,Strength,Tone,ROM,Mobility,Edema Cognitive Skills: Attention,Safety Awareness,Problem Solve,Sequencing     Visit Diagnosis: Unsteadiness on feet  Hemiplegia and hemiparesis following cerebral infarction affecting left non-dominant side (HCC)  Muscle weakness (generalized)  Attention and concentration deficit  Other lack of coordination  Other disturbances of skin sensation    Problem List Patient Active Problem List   Diagnosis Date Noted  .  CVA (cerebral vascular accident) (HCC) 10/18/2020  . Primary hypertension   . Left arm weakness   . Left leg weakness   . Subcortical infarction [facility]) 10/12/2020    Collier Salina, OTR/L 12/16/2020, 12:31 PM  [facility] [address] [place], [place], [address] Phone: [phone]   Fax:  [phone]  Name: Corazon Nickolas MRN: [id number] Date of Birth: Mar 15, 1966

## 2020-12-20 ENCOUNTER — Ambulatory Visit: Payer: BC Managed Care – PPO

## 2020-12-20 ENCOUNTER — Other Ambulatory Visit: Payer: Self-pay

## 2020-12-20 ENCOUNTER — Encounter: Payer: Self-pay | Admitting: Occupational Therapy

## 2020-12-20 ENCOUNTER — Ambulatory Visit: Payer: BC Managed Care – PPO | Admitting: Occupational Therapy

## 2020-12-20 DIAGNOSIS — I69354 Hemiplegia and hemiparesis following cerebral infarction affecting left non-dominant side: Secondary | ICD-10-CM | POA: Diagnosis not present

## 2020-12-20 DIAGNOSIS — R41840 Attention and concentration deficit: Secondary | ICD-10-CM | POA: Diagnosis not present

## 2020-12-20 DIAGNOSIS — R278 Other lack of coordination: Secondary | ICD-10-CM | POA: Diagnosis not present

## 2020-12-20 DIAGNOSIS — R208 Other disturbances of skin sensation: Secondary | ICD-10-CM | POA: Diagnosis not present

## 2020-12-20 DIAGNOSIS — M6281 Muscle weakness (generalized): Secondary | ICD-10-CM

## 2020-12-20 DIAGNOSIS — R2681 Unsteadiness on feet: Secondary | ICD-10-CM | POA: Diagnosis not present

## 2020-12-20 NOTE — Therapy (Signed)
[facility] [address] [place], [place], [address] Phone: [phone]   Fax:  [phone]  Occupational Therapy Treatment  Patient Details  Name: Shannon Garza MRN: [id number] Date of Birth: 1966/04/17 Referring Provider (OT): Charlotte Crumb   Encounter Date: 12/20/2020   OT End of Session - 12/20/20 1148    Visit Number 7    Number of Visits 17    Date for OT Re-Evaluation 01/25/21    Authorization Type BCBS;  VL:[place]    OT Start Time 1146    OT Stop Time 1230    OT Time Calculation (min) 44 min    Activity Tolerance Patient tolerated treatment well    Behavior During Therapy [facility] for tasks assessed/performed           Past Medical History:  Diagnosis Date  . Diabetes mellitus without complication (HCC)   . Hypertension     History reviewed. No pertinent surgical history.  There were no vitals filed for this visit.   Subjective Assessment - 12/20/20 1148    Subjective  "my stomach isn't feeling too well" Pt denies any other pain.    Patient Stated Goals Use of left hand    Currently in Pain? No/denies             Simulated braiding with yellow theraband on elevated surface. Pt did a tight braid approx 12 inches before requiring break d/t hand fatigue.   Hand Gripper: with LUE on level 2 with black spring. Pt picked up 1 inch blocks with gripper with several breaks d/t fatigue  Resistance [employer]ins 1-8# with LUE and placing on antenna with emphasis on LUE shoulder functional reaching and grip strengthening with sustained grasp    Closed Chain x10 reps of foam roller on wall x 2 sets for reaching                OT Short Term Goals - 12/16/20 1229      OT SHORT TERM GOAL #1   Title Patient will complete an HEP designed to improve LUE proximal active movement    Time 4    Period Weeks    Status Achieved    Target Date 12/26/20      OT SHORT TERM GOAL #2   Title Patient will tie  shoes with increased time using BUE    Time 4    Period Weeks    Status Achieved      OT SHORT TERM GOAL #3   Title Patient will utilize BUE to wring out washcloth effectively.    Time 4    Period Weeks    Status Achieved      OT SHORT TERM GOAL #4   Title Patient will hold phone in left hand to allow her to send full  text with right hand    Time 4    Period Weeks    Status Achieved   pt reports holding it but is having drops            OT Long Term Goals - 12/16/20 1229      OT LONG TERM GOAL #1   Title Patient will complete an updated HEP designed to improve functional use of LUE with improved coordination, range of motion and strength    Time 8    Period Weeks    Status On-going      OT LONG TERM GOAL #2   Title Patient will complete 9 hole peg test in less  than 2 min with LUE to help pick up and maneuver small items    Baseline over 3 min    Time 8    Period Weeks    Status On-going      OT LONG TERM GOAL #3   Title Patient will don/doff bra with modified independence    Time 8    Period Weeks    Status Achieved   pt reports doing it and it is getting easier.     OT LONG TERM GOAL #4   Title Patient will utilize LUE to wash right side of body without assistance    Time 8    Period Weeks    Status Achieved      OT LONG TERM GOAL #5   Title Patient will safely cut fruit using BUE method    Time 8    Period Weeks    Status On-going   pt reports cutting up vegetables     OT LONG TERM GOAL #6   Title Foto score at discharge    Time 8    Period Weeks    Status New                 Plan - 12/20/20 1214    Clinical Impression Statement Pt showing continued improvement in functional use and timing in movement in LUE    OT Occupational Profile and History Detailed Assessment- Review of Records and additional review of physical, cognitive, psychosocial history related to current functional performance    Occupational performance deficits (Please refer  to evaluation for details): ADL's;IADL's;Work    Body Structure / Function / Physical Skills ADL;Coordination;Endurance;GMC;UE functional use;Balance;Decreased knowledge of precautions;Sensation;Vestibular;Vision;Pain;IADL;Flexibility;Decreased knowledge of use of DME;Body mechanics;Cardiopulmonary status limiting activity;Dexterity;FMC;Strength;Tone;ROM;Mobility;Edema    Cognitive Skills Attention;Safety Awareness;Problem Solve;Sequencing    Rehab Potential Good    Clinical Decision Making Several treatment options, min-mod task modification necessary    Comorbidities Affecting Occupational Performance: May have comorbidities impacting occupational performance    Modification or Assistance to Complete Evaluation  No modification of tasks or assist necessary to complete eval    OT Frequency 2x / week    OT Duration 8 weeks    OT Treatment/Interventions Self-care/ADL training;Electrical Stimulation;Therapeutic exercise;Visual/perceptual remediation/compensation;Aquatic Therapy;Neuromuscular education;Splinting;Patient/family education;Building services engineer;Therapeutic activities;Balance training;DME and/or AE instruction;Manual Therapy;Cognitive remediation/compensation    Plan NMR LUE - mid to high supported reach, distal LUE coordination    OT Home Exercise Plan has supine shoulder exercises    Consulted and Agree with Plan of Care Patient           Patient will benefit from skilled therapeutic intervention in order to improve the following deficits and impairments:   Body Structure / Function / Physical Skills: ADL,Coordination,Endurance,GMC,UE functional use,Balance,Decreased knowledge of precautions,Sensation,Vestibular,Vision,Pain,IADL,Flexibility,Decreased knowledge of use of DME,Body mechanics,Cardiopulmonary status limiting activity,Dexterity,FMC,Strength,Tone,ROM,Mobility,Edema Cognitive Skills: Attention,Safety Awareness,Problem Solve,Sequencing     Visit  Diagnosis: Unsteadiness on feet  Hemiplegia and hemiparesis following cerebral infarction affecting left non-dominant side (HCC)  Muscle weakness (generalized)  Attention and concentration deficit  Other disturbances of skin sensation  Other lack of coordination    Problem List Patient Active Problem List   Diagnosis Date Noted  . CVA (cerebral vascular accident) (HCC) 10/18/2020  . Primary hypertension   . Left arm weakness   . Left leg weakness   . Subcortical infarction [facility]) 10/12/2020    Junious Dresser MOT, OTR/L  12/20/2020, 12:51 PM  [facility] [address] [place], [place],  [address] Phone: [phone]   Fax:  [phone]  Name: Shannon Garza MRN: [id number] Date of Birth: June 19, 1966

## 2020-12-21 ENCOUNTER — Ambulatory Visit: Payer: BC Managed Care – PPO | Admitting: Internal Medicine

## 2020-12-22 ENCOUNTER — Encounter: Payer: Self-pay | Admitting: Occupational Therapy

## 2020-12-22 ENCOUNTER — Other Ambulatory Visit: Payer: Self-pay

## 2020-12-22 ENCOUNTER — Ambulatory Visit: Payer: BC Managed Care – PPO

## 2020-12-22 ENCOUNTER — Ambulatory Visit: Payer: BC Managed Care – PPO | Admitting: Occupational Therapy

## 2020-12-22 DIAGNOSIS — I69354 Hemiplegia and hemiparesis following cerebral infarction affecting left non-dominant side: Secondary | ICD-10-CM | POA: Diagnosis not present

## 2020-12-22 DIAGNOSIS — R208 Other disturbances of skin sensation: Secondary | ICD-10-CM | POA: Diagnosis not present

## 2020-12-22 DIAGNOSIS — R2681 Unsteadiness on feet: Secondary | ICD-10-CM | POA: Diagnosis not present

## 2020-12-22 DIAGNOSIS — M6281 Muscle weakness (generalized): Secondary | ICD-10-CM

## 2020-12-22 DIAGNOSIS — R278 Other lack of coordination: Secondary | ICD-10-CM

## 2020-12-22 DIAGNOSIS — R41840 Attention and concentration deficit: Secondary | ICD-10-CM | POA: Diagnosis not present

## 2020-12-22 NOTE — Therapy (Signed)
[facility] [address] [place], [place], [address] Phone: [phone]   Fax:  [phone]  Occupational Therapy Treatment  Patient Details  Name: Shannon Garza MRN: [id number] Date of Birth: [birth year] Referring Provider (OT): Charlotte Crumb   Encounter Date: 12/22/2020   OT End of Session - 12/22/20 1143    Visit Number 8    Number of Visits 17    Date for OT Re-Evaluation 01/25/21    Authorization Type BCBS;  VL:[place]    OT Start Time 1104    OT Stop Time 1145    OT Time Calculation (min) 41 min           Past Medical History:  Diagnosis Date  . Diabetes mellitus without complication (HCC)   . Hypertension     History reviewed. No pertinent surgical history.  There were no vitals filed for this visit.   Subjective Assessment - 12/22/20 1142    Patient Stated Goals Use of left hand    Currently in Pain? No/denies              Treatment:Seated low range closed chain shoulder flexion with therapist facilitating shoulder and scapula, then standing holding vertical pole to perform min range shoulder flexion and abduction, min facilitation, Standing for mid-high range shoulder flexion and circumduction with UE Ranger for AA/ROM, min v.c/ facilitation for positioning. Seated at table fine motor coordination tasking; placing grooved pegs with mod difficulty/ v.c Flipping and dealing playing cards min-mod difficulty with RUE, min vc.                    OT Short Term Goals - 12/16/20 1229      OT SHORT TERM GOAL #1   Title Patient will complete an HEP designed to improve LUE proximal active movement    Time 4    Period Weeks    Status Achieved    Target Date 12/26/20      OT SHORT TERM GOAL #2   Title Patient will tie shoes with increased time using BUE    Time 4    Period Weeks    Status Achieved      OT SHORT TERM GOAL #3   Title Patient will utilize BUE to wring out washcloth  effectively.    Time 4    Period Weeks    Status Achieved      OT SHORT TERM GOAL #4   Title Patient will hold phone in left hand to allow her to send full  text with right hand    Time 4    Period Weeks    Status Achieved   pt reports holding it but is having drops            OT Long Term Goals - 12/16/20 1229      OT LONG TERM GOAL #1   Title Patient will complete an updated HEP designed to improve functional use of LUE with improved coordination, range of motion and strength    Time 8    Period Weeks    Status On-going      OT LONG TERM GOAL #2   Title Patient will complete 9 hole peg test in less than 2 min with LUE to help pick up and maneuver small items    Baseline over 3 min    Time 8    Period Weeks    Status On-going      OT LONG TERM GOAL #3  Title Patient will don/doff bra with modified independence    Time 8    Period Weeks    Status Achieved   pt reports doing it and it is getting easier.     OT LONG TERM GOAL #4   Title Patient will utilize LUE to wash right side of body without assistance    Time 8    Period Weeks    Status Achieved      OT LONG TERM GOAL #5   Title Patient will safely cut fruit using BUE method    Time 8    Period Weeks    Status On-going   pt reports cutting up vegetables     OT LONG TERM GOAL #6   Title Foto score at discharge    Time 8    Period Weeks    Status New                 Plan - 12/22/20 1143    Clinical Impression Statement Pt is progressing towards goals with improving LUE functional use.    OT Occupational Profile and History Detailed Assessment- Review of Records and additional review of physical, cognitive, psychosocial history related to current functional performance    Occupational performance deficits (Please refer to evaluation for details): ADL's;IADL's;Work    Body Structure / Function / Physical Skills ADL;Coordination;Endurance;GMC;UE functional use;Balance;Decreased knowledge of  precautions;Sensation;Vestibular;Vision;Pain;IADL;Flexibility;Decreased knowledge of use of DME;Body mechanics;Cardiopulmonary status limiting activity;Dexterity;FMC;Strength;Tone;ROM;Mobility;Edema    Cognitive Skills Attention;Safety Awareness;Problem Solve;Sequencing    Rehab Potential Good    Clinical Decision Making Several treatment options, min-mod task modification necessary    Comorbidities Affecting Occupational Performance: May have comorbidities impacting occupational performance    Modification or Assistance to Complete Evaluation  No modification of tasks or assist necessary to complete eval    OT Frequency 2x / week    OT Duration 8 weeks    OT Treatment/Interventions Self-care/ADL training;Electrical Stimulation;Therapeutic exercise;Visual/perceptual remediation/compensation;Aquatic Therapy;Neuromuscular education;Splinting;Patient/family education;Building services engineer;Therapeutic activities;Balance training;DME and/or AE instruction;Manual Therapy;Cognitive remediation/compensation    Plan NMR LUE , coordination    OT Home Exercise Plan has supine shoulder exercises    Consulted and Agree with Plan of Care Patient           Patient will benefit from skilled therapeutic intervention in order to improve the following deficits and impairments:   Body Structure / Function / Physical Skills: ADL,Coordination,Endurance,GMC,UE functional use,Balance,Decreased knowledge of precautions,Sensation,Vestibular,Vision,Pain,IADL,Flexibility,Decreased knowledge of use of DME,Body mechanics,Cardiopulmonary status limiting activity,Dexterity,FMC,Strength,Tone,ROM,Mobility,Edema Cognitive Skills: Attention,Safety Awareness,Problem Solve,Sequencing     Visit Diagnosis: Hemiplegia and hemiparesis following cerebral infarction affecting left non-dominant side (HCC)  Muscle weakness (generalized)  Attention and concentration deficit  Other disturbances of skin sensation  Other lack  of coordination    Problem List Patient Active Problem List   Diagnosis Date Noted  . CVA (cerebral vascular accident) (HCC) 10/18/2020  . Primary hypertension   . Left arm weakness   . Left leg weakness   . Subcortical infarction (HCC) 10/12/2020    Shannon Garza 12/22/2020, 12:08 PM  [facility] [address] [place], [place], [address] Phone: [phone]   Fax:  [phone]  Name: Shannon Garza MRN: [id number] Date of Birth: [birth year]

## 2020-12-23 ENCOUNTER — Ambulatory Visit: Payer: BC Managed Care – PPO

## 2020-12-23 ENCOUNTER — Encounter: Payer: BC Managed Care – PPO | Admitting: Occupational Therapy

## 2020-12-23 ENCOUNTER — Telehealth: Payer: Self-pay

## 2020-12-23 NOTE — Telephone Encounter (Signed)
Shannon Garza has requested a call back from Dr. Carlis Abbott. To discuss the return to work date of, blood pressure medication refill and a personal issue.   Call back phone [phone].

## 2020-12-23 NOTE — Telephone Encounter (Signed)
Can you please schedule for phone visit tomorrow morning at 8:20am? Thank you!

## 2020-12-24 ENCOUNTER — Encounter (HOSPITAL_BASED_OUTPATIENT_CLINIC_OR_DEPARTMENT_OTHER): Payer: BC Managed Care – PPO | Admitting: Physical Medicine and Rehabilitation

## 2020-12-24 ENCOUNTER — Encounter: Payer: Self-pay | Admitting: Physical Medicine and Rehabilitation

## 2020-12-24 ENCOUNTER — Other Ambulatory Visit: Payer: Self-pay

## 2020-12-24 VITALS — BP 140/83 | Ht 64.0 in | Wt 264.0 lb

## 2020-12-24 DIAGNOSIS — I639 Cerebral infarction, unspecified: Secondary | ICD-10-CM

## 2020-12-24 DIAGNOSIS — R29898 Other symptoms and signs involving the musculoskeletal system: Secondary | ICD-10-CM

## 2020-12-24 DIAGNOSIS — I10 Essential (primary) hypertension: Secondary | ICD-10-CM | POA: Diagnosis not present

## 2020-12-24 MED ORDER — LISINOPRIL 40 MG PO TABS: 40.0000 mg | ORAL_TABLET | Freq: Every day | ORAL | 2 refills | Status: DC

## 2020-12-24 NOTE — Addendum Note (Signed)
Addended by: Horton Chin on: 12/24/2020 01:09 PM   Modules accepted: Orders, Level of Service

## 2020-12-24 NOTE — Progress Notes (Addendum)
Subjective:    Patient ID: Shannon Garza, female    DOB: June 18, 1966, 55 y.o.   MRN: [id number]  HPI  Due to national recommendations of social distancing because of COVID 42, an audio/video tele-health visit is felt to be the most appropriate encounter for this patient at this time. See MyChart message from today for the patient's consent to a tele-health encounter with [facility]. This is a follow up tele-visit via phone. The patient is at home. MD is at office.  Shannon Garza presents for post-stroke hospital f/u.  1) Weakness in left side -she feels weaker some times at night -she was concerned regarding whether she was having repeat stroke -she is doing well with walking. -feeling some pain in the middle of her arm. Pain is 5/10. She is working on that with OT.   2) HTN: -BP is 140s -she has been taking lisinopril 40mg  daily, needs refill  3) obesity: -lost about 6-7 lbs.   4) diabetes: has been 82 when she checked at home.  -she been very careful with her diet.   5) Subcortical infarction -plans to return to work on May 15th.    Pain Inventory Average Pain 5 Pain Right Now 5 My pain is dull  LOCATION OF PAIN  Wrist, hand   BOWEL Number of stools per week: 8 Oral laxative use Yes  Type of laxative herbal laxative OTC Enema or suppository use No  History of colostomy No  Incontinent No   BLADDER Normal In and out cath, frequency n/a Able to self cath No  Bladder incontinence No  Frequent urination No  Leakage with coughing No  Difficulty starting stream No  Incomplete bladder emptying No    Mobility walk without assistance how many minutes can you walk? 8+ ability to climb steps?  yes do you drive?  yes Do you have any goals in this area?  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   yes  Neuro/Psych weakness  Prior Studies hospital f/u  Physicians involved in your care hospital f/u   No family history on file. Social History   Socioeconomic History  . Marital status: Single    Spouse name: Not on file  . Number of children: Not on file  . Years of education: Not on file  . Highest education level: Not on file  Occupational History  . Not on file  Tobacco Use  . Smoking status: Never Smoker  . Smokeless tobacco: Never Used  Vaping Use  . Vaping Use: Never used  Substance and Sexual Activity  . Alcohol use: Not Currently  . Drug use: Not Currently  . Sexual activity: Not Currently  Other Topics Concern  . Not on file  Social History Narrative  . Not on file   Social Determinants of Health   Financial Resource Strain: Not on file  Food Insecurity: Not on file  Transportation Needs: Not on file  Physical Activity: Not on file  Stress: Not on file  Social Connections: Not on file   History reviewed. No pertinent surgical history. Past Medical History:  Diagnosis Date  . Diabetes mellitus without complication (HCC)   . Hypertension    BP 140/83 Comment: per patient today's visit is a tele  Ht 5\' 4"  (1.626 m)   Wt 264 lb (119.7 kg)   BMI 45.32 kg/m  Opioid Risk Score:   Fall Risk Score:  `1  Depression screen PHQ 2/9  Depression screen [facility] 2/9 12/24/2020 12/01/2020  Decreased Interest 0 2  Down, Depressed, Hopeless 0 0  PHQ - 2 Score 0 2  Altered sleeping - 0  Tired, decreased energy - 1  Change in appetite - 0  Feeling bad or failure about yourself  - 0  Trouble concentrating - 0  Moving slowly or fidgety/restless - 1  Suicidal thoughts - 0  PHQ-9 Score - 4    Review of Systems  Constitutional: Negative.   HENT: Negative.   Eyes: Negative.   Respiratory: Negative.   Cardiovascular: Negative.   Gastrointestinal: Negative.   Endocrine: Negative.   Genitourinary: Negative.   Musculoskeletal: Negative.   Skin: Negative.    Allergic/Immunologic: Negative.   Neurological: Positive for weakness.  Hematological: Negative.   Psychiatric/Behavioral: Negative.   All other systems reviewed and are negative.      Objective:   Physical Exam Not performed since patient was seen via phone.     Assessment & Plan:  Shannon Garza is a 55 year old woman who presents for post-stroke follow-up.   1) Subcortical infarction -continue OT -continue walking at home.  -discussed weakness and prognosis  2) Prediabetes: -Repeat HgbA1c next visit.  3) HTN: -refilled lisinopril -Reviewed BP meds today.  -Advised regarding healthy foods that can help lower blood pressure and provided with a list: 1) citrus foods 2) salmon and other fatty fish 3) swiss chard (leafy green) 4) pumpkin seeds 5) Beans and lentils 6) Berries 7) Amaranth (whole grain, can be cooked similarly to rice and oats) 8) Pistachios 9) Carrots 10) Celery 11) Tomatoes 12) Broccoli 13) Greek yogurt 14) Herbs and spices: Celery seed, cilantro, saffron, lemongrass, black cumin, ginseng, cinnamon, cardamom, sweet basil, and ginger 15) Chia and flax seeds 16) Beets 17) spinach -Educated that goal BP is 120/80. -Made goal to incorporate some of the above foods into her diet.   10 minutes spent in discussion of return to work, [employer] fast streamer for patient, reviewing Bps, refilling Lisinopril, discussing mobility and current therapy, recommending blue emu oil for arm pain, discussing no added sugar diet and repeating HgbA1c next visit

## 2020-12-27 ENCOUNTER — Ambulatory Visit: Payer: BC Managed Care – PPO

## 2020-12-27 ENCOUNTER — Other Ambulatory Visit: Payer: Self-pay

## 2020-12-27 ENCOUNTER — Encounter: Payer: Self-pay | Admitting: Occupational Therapy

## 2020-12-27 ENCOUNTER — Ambulatory Visit: Payer: BC Managed Care – PPO | Attending: Physical Medicine and Rehabilitation | Admitting: Occupational Therapy

## 2020-12-27 DIAGNOSIS — R278 Other lack of coordination: Secondary | ICD-10-CM | POA: Insufficient documentation

## 2020-12-27 DIAGNOSIS — R208 Other disturbances of skin sensation: Secondary | ICD-10-CM | POA: Diagnosis not present

## 2020-12-27 DIAGNOSIS — R2681 Unsteadiness on feet: Secondary | ICD-10-CM

## 2020-12-27 DIAGNOSIS — I69354 Hemiplegia and hemiparesis following cerebral infarction affecting left non-dominant side: Secondary | ICD-10-CM | POA: Diagnosis not present

## 2020-12-27 DIAGNOSIS — R600 Localized edema: Secondary | ICD-10-CM | POA: Diagnosis not present

## 2020-12-27 DIAGNOSIS — R41840 Attention and concentration deficit: Secondary | ICD-10-CM | POA: Diagnosis not present

## 2020-12-27 DIAGNOSIS — M6281 Muscle weakness (generalized): Secondary | ICD-10-CM

## 2020-12-27 NOTE — Therapy (Signed)
[facility] [address] [place], [place], [address] Phone: [phone]   Fax:  [phone]  Occupational Therapy Treatment  Patient Details  Name: Esly Selvage MRN: [id number] Date of Birth: Jan 15, 1966 Referring Provider (OT): Charlotte Crumb   Encounter Date: 12/27/2020   OT End of Session - 12/27/20 1104    Visit Number 9    Number of Visits 17    Date for OT Re-Evaluation 01/25/21    Authorization Type BCBS;  VL:[place]    OT Start Time 1104    OT Stop Time 1145    OT Time Calculation (min) 41 min    Activity Tolerance Patient tolerated treatment well    Behavior During Therapy [facility] for tasks assessed/performed           Past Medical History:  Diagnosis Date  . Diabetes mellitus without complication (HCC)   . Hypertension     History reviewed. No pertinent surgical history.  There were no vitals filed for this visit.   Subjective Assessment - 12/27/20 1105    Subjective  "my hand was kind of freezing up a little bit ago"    Patient Stated Goals Use of left hand    Currently in Pain? No/denies               Fluidotherapy x 10 minutes for LUE to address stiffness. No adverse reactions.   Golf Balls with LUE  5 revolutions each direction  In hand manipulation w/ dice - with 2 drops  Pennies holding dice in LUE for encouraging more pincer grasp while stacking pennies and then worked on improving in hand manipulation with sliding penny to fingertips from palm - moderate drops  ADLs pt reports increased ease with cutting vegetables, taking pills etc with LUE without dropping. Pt reports stiffness in LUE hand when waking up in the morning. Pt reports washing, coloring and twisting her hair over the weekend with no pain - reports it was challenging but she did it.    Arm Bike: for conditioning and reciprocal movements on Level 2 for 5 minutes (forward and backward)              OT Short Term  Goals - 12/16/20 1229      OT SHORT TERM GOAL #1   Title Patient will complete an HEP designed to improve LUE proximal active movement    Time 4    Period Weeks    Status Achieved    Target Date 12/26/20      OT SHORT TERM GOAL #2   Title Patient will tie shoes with increased time using BUE    Time 4    Period Weeks    Status Achieved      OT SHORT TERM GOAL #3   Title Patient will utilize BUE to wring out washcloth effectively.    Time 4    Period Weeks    Status Achieved      OT SHORT TERM GOAL #4   Title Patient will hold phone in left hand to allow her to send full  text with right hand    Time 4    Period Weeks    Status Achieved   pt reports holding it but is having drops            OT Long Term Goals - 12/16/20 1229      OT LONG TERM GOAL #1   Title Patient will complete an updated HEP designed to improve functional  use of LUE with improved coordination, range of motion and strength    Time 8    Period Weeks    Status On-going      OT LONG TERM GOAL #2   Title Patient will complete 9 hole peg test in less than 2 min with LUE to help pick up and maneuver small items    Baseline over 3 min    Time 8    Period Weeks    Status On-going      OT LONG TERM GOAL #3   Title Patient will don/doff bra with modified independence    Time 8    Period Weeks    Status Achieved   pt reports doing it and it is getting easier.     OT LONG TERM GOAL #4   Title Patient will utilize LUE to wash right side of body without assistance    Time 8    Period Weeks    Status Achieved      OT LONG TERM GOAL #5   Title Patient will safely cut fruit using BUE method    Time 8    Period Weeks    Status On-going   pt reports cutting up vegetables     OT LONG TERM GOAL #6   Title Foto score at discharge    Time 8    Period Weeks    Status New                 Plan - 12/27/20 1145    Clinical Impression Statement Pt is progressing towards goals. Pt continues with  some stiffness in LUE hand but is progressing with coordination and strengthening.    OT Occupational Profile and History Detailed Assessment- Review of Records and additional review of physical, cognitive, psychosocial history related to current functional performance    Occupational performance deficits (Please refer to evaluation for details): ADL's;IADL's;Work    Body Structure / Function / Physical Skills ADL;Coordination;Endurance;GMC;UE functional use;Balance;Decreased knowledge of precautions;Sensation;Vestibular;Vision;Pain;IADL;Flexibility;Decreased knowledge of use of DME;Body mechanics;Cardiopulmonary status limiting activity;Dexterity;FMC;Strength;Tone;ROM;Mobility;Edema    Cognitive Skills Attention;Safety Awareness;Problem Solve;Sequencing    Rehab Potential Good    Clinical Decision Making Several treatment options, min-mod task modification necessary    Comorbidities Affecting Occupational Performance: May have comorbidities impacting occupational performance    Modification or Assistance to Complete Evaluation  No modification of tasks or assist necessary to complete eval    OT Frequency 2x / week    OT Duration 8 weeks    OT Treatment/Interventions Self-care/ADL training;Electrical Stimulation;Therapeutic exercise;Visual/perceptual remediation/compensation;Aquatic Therapy;Neuromuscular education;Splinting;Patient/family education;Building services engineer;Therapeutic activities;Balance training;DME and/or AE instruction;Manual Therapy;Cognitive remediation/compensation    Plan NMR LUE , coordination    OT Home Exercise Plan has supine shoulder exercises    Consulted and Agree with Plan of Care Patient           Patient will benefit from skilled therapeutic intervention in order to improve the following deficits and impairments:   Body Structure / Function / Physical Skills: ADL,Coordination,Endurance,GMC,UE functional use,Balance,Decreased knowledge of  precautions,Sensation,Vestibular,Vision,Pain,IADL,Flexibility,Decreased knowledge of use of DME,Body mechanics,Cardiopulmonary status limiting activity,Dexterity,FMC,Strength,Tone,ROM,Mobility,Edema Cognitive Skills: Attention,Safety Awareness,Problem Solve,Sequencing     Visit Diagnosis: Hemiplegia and hemiparesis following cerebral infarction affecting left non-dominant side (HCC)  Muscle weakness (generalized)  Attention and concentration deficit  Other disturbances of skin sensation  Other lack of coordination  Unsteadiness on feet    Problem List Patient Active Problem List   Diagnosis Date Noted  . CVA (cerebral vascular accident) (HCC) 10/18/2020  .  Primary hypertension   . Left arm weakness   . Left leg weakness   . Subcortical infarction [facility]) 10/12/2020    Junious Dresser MOT, OTR/L  12/27/2020, 11:45 AM  [facility] [address] [place], [place], [address] Phone: [phone]   Fax:  [phone]  Name: Alizah Sills MRN: [id number] Date of Birth: 24-Aug-1966

## 2020-12-29 ENCOUNTER — Other Ambulatory Visit: Payer: Self-pay

## 2020-12-29 ENCOUNTER — Ambulatory Visit: Payer: BC Managed Care – PPO | Admitting: Occupational Therapy

## 2020-12-29 ENCOUNTER — Encounter: Payer: Self-pay | Admitting: Occupational Therapy

## 2020-12-29 ENCOUNTER — Ambulatory Visit: Payer: BC Managed Care – PPO

## 2020-12-29 DIAGNOSIS — R208 Other disturbances of skin sensation: Secondary | ICD-10-CM

## 2020-12-29 DIAGNOSIS — R278 Other lack of coordination: Secondary | ICD-10-CM | POA: Diagnosis not present

## 2020-12-29 DIAGNOSIS — I69354 Hemiplegia and hemiparesis following cerebral infarction affecting left non-dominant side: Secondary | ICD-10-CM

## 2020-12-29 DIAGNOSIS — R2681 Unsteadiness on feet: Secondary | ICD-10-CM | POA: Diagnosis not present

## 2020-12-29 DIAGNOSIS — R41840 Attention and concentration deficit: Secondary | ICD-10-CM | POA: Diagnosis not present

## 2020-12-29 DIAGNOSIS — R600 Localized edema: Secondary | ICD-10-CM | POA: Diagnosis not present

## 2020-12-29 DIAGNOSIS — M6281 Muscle weakness (generalized): Secondary | ICD-10-CM

## 2020-12-29 NOTE — Therapy (Signed)
[facility] [address] [place], [place], [address] Phone: [phone]   Fax:  [phone]  Occupational Therapy Treatment  Patient Details  Name: Shannon Garza MRN: [id number] Date of Birth: Dec 11, 1965 Referring Provider (OT): Charlotte Crumb   Encounter Date: 12/29/2020   OT End of Session - 12/29/20 1421    Visit Number 10    Number of Visits 17    Date for OT Re-Evaluation 01/25/21    Authorization Type BCBS;  VL:[place]    OT Start Time 1147    OT Stop Time 1230    OT Time Calculation (min) 43 min    Activity Tolerance Patient tolerated treatment well    Behavior During Therapy [facility] for tasks assessed/performed           Past Medical History:  Diagnosis Date  . Diabetes mellitus without complication (HCC)   . Hypertension     History reviewed. No pertinent surgical history.  There were no vitals filed for this visit.   Subjective Assessment - 12/29/20 1150    Subjective  Better able to use left hand to pick up small things that drop.  "I was able to use both hands to get ready to shuffle the cards."  I wore a jump suit Monday and was able to tie that with both hands."  I tripped and I caught myself with my left hand."    Patient Stated Goals Use of left hand    Currently in Pain? No/denies    Pain Score 0-No pain                        OT Treatments/Exercises (OP) - 12/29/20 0001      ADLs   UB Dressing Patient reports pain with GH extension and internal rotation with elbow flexion - when adjusting her bra.  Worked on components of this functional task in pain free ranges in standing with emphasis on postural control      Neurological Re-education Exercises   Other Exercises 1 Neuromuscular reeducation to address mid to high reach with UE supported at 90* of flexion and reaching overhead.  Working to place items on overhead shelf - extending time with hand over head.                     OT Short Term Goals - 12/29/20 1423      OT SHORT TERM GOAL #1   Title Patient will complete an HEP designed to improve LUE proximal active movement    Time 4    Period Weeks    Status Achieved    Target Date 12/26/20      OT SHORT TERM GOAL #2   Title Patient will tie shoes with increased time using BUE    Time 4    Period Weeks    Status Achieved      OT SHORT TERM GOAL #3   Title Patient will utilize BUE to wring out washcloth effectively.    Time 4    Period Weeks    Status Achieved      OT SHORT TERM GOAL #4   Title Patient will hold phone in left hand to allow her to send full  text with right hand    Time 4    Period Weeks    Status Achieved   pt reports holding it but is having drops            OT  Long Term Goals - 12/29/20 1423      OT LONG TERM GOAL #1   Title Patient will complete an updated HEP designed to improve functional use of LUE with improved coordination, range of motion and strength    Time 8    Period Weeks    Status On-going      OT LONG TERM GOAL #2   Title Patient will complete 9 hole peg test in less than 2 min with LUE to help pick up and maneuver small items    Baseline over 3 min    Time 8    Period Weeks    Status On-going      OT LONG TERM GOAL #3   Title Patient will don/doff bra with modified independence    Time 8    Period Weeks    Status Achieved   pt reports doing it and it is getting easier.     OT LONG TERM GOAL #4   Title Patient will utilize LUE to wash right side of body without assistance    Time 8    Period Weeks    Status Achieved      OT LONG TERM GOAL #5   Title Patient will safely cut fruit using BUE method    Time 8    Period Weeks    Status On-going   pt reports cutting up vegetables     OT LONG TERM GOAL #6   Title Foto score at discharge    Time 8    Period Weeks    Status New                 Plan - 12/29/20 1422    Clinical Impression Statement Pt is progressing  towards goals and needs continued work for shoulder stability and scapular endurance for mid to high reach.    OT Occupational Profile and History Detailed Assessment- Review of Records and additional review of physical, cognitive, psychosocial history related to current functional performance    Occupational performance deficits (Please refer to evaluation for details): ADL's;IADL's;Work    Body Structure / Function / Physical Skills ADL;Coordination;Endurance;GMC;UE functional use;Balance;Decreased knowledge of precautions;Sensation;Vestibular;Vision;Pain;IADL;Flexibility;Decreased knowledge of use of DME;Body mechanics;Cardiopulmonary status limiting activity;Dexterity;FMC;Strength;Tone;ROM;Mobility;Edema    Cognitive Skills Attention;Safety Awareness;Problem Solve;Sequencing    Rehab Potential Good    Clinical Decision Making Several treatment options, min-mod task modification necessary    Comorbidities Affecting Occupational Performance: May have comorbidities impacting occupational performance    Modification or Assistance to Complete Evaluation  No modification of tasks or assist necessary to complete eval    OT Frequency 2x / week    OT Duration 8 weeks    OT Treatment/Interventions Self-care/ADL training;Electrical Stimulation;Therapeutic exercise;Visual/perceptual remediation/compensation;Aquatic Therapy;Neuromuscular education;Splinting;Patient/family education;Building services engineer;Therapeutic activities;Balance training;DME and/or AE instruction;Manual Therapy;Cognitive remediation/compensation    Plan NMR LUE , coordination    OT Home Exercise Plan has supine shoulder exercises    Consulted and Agree with Plan of Care Patient           Patient will benefit from skilled therapeutic intervention in order to improve the following deficits and impairments:   Body Structure / Function / Physical Skills: ADL,Coordination,Endurance,GMC,UE functional use,Balance,Decreased knowledge  of precautions,Sensation,Vestibular,Vision,Pain,IADL,Flexibility,Decreased knowledge of use of DME,Body mechanics,Cardiopulmonary status limiting activity,Dexterity,FMC,Strength,Tone,ROM,Mobility,Edema Cognitive Skills: Attention,Safety Awareness,Problem Solve,Sequencing     Visit Diagnosis: Hemiplegia and hemiparesis following cerebral infarction affecting left non-dominant side (HCC)  Muscle weakness (generalized)  Attention and concentration deficit  Other disturbances of skin sensation  Other lack  of coordination  Unsteadiness on feet    Problem List Patient Active Problem List   Diagnosis Date Noted  . CVA (cerebral vascular accident) (HCC) 10/18/2020  . Primary hypertension   . Left arm weakness   . Left leg weakness   . Subcortical infarction [facility]) 10/12/2020    Collier Salina, OTR/L 12/29/2020, 2:24 PM  [facility] [address] [place], [place], [address] Phone: [phone]   Fax:  [phone]  Name: Shannon Garza MRN: [id number] Date of Birth: [birth year]

## 2020-12-30 ENCOUNTER — Encounter: Payer: BC Managed Care – PPO | Admitting: Occupational Therapy

## 2020-12-30 ENCOUNTER — Ambulatory Visit: Payer: BC Managed Care – PPO

## 2021-01-03 ENCOUNTER — Encounter: Payer: BC Managed Care – PPO | Admitting: Occupational Therapy

## 2021-01-04 ENCOUNTER — Other Ambulatory Visit: Payer: Self-pay

## 2021-01-04 ENCOUNTER — Ambulatory Visit: Payer: BC Managed Care – PPO | Admitting: Occupational Therapy

## 2021-01-04 ENCOUNTER — Encounter: Payer: Self-pay | Admitting: Occupational Therapy

## 2021-01-04 DIAGNOSIS — R278 Other lack of coordination: Secondary | ICD-10-CM

## 2021-01-04 DIAGNOSIS — R2681 Unsteadiness on feet: Secondary | ICD-10-CM | POA: Diagnosis not present

## 2021-01-04 DIAGNOSIS — R208 Other disturbances of skin sensation: Secondary | ICD-10-CM

## 2021-01-04 DIAGNOSIS — R41840 Attention and concentration deficit: Secondary | ICD-10-CM

## 2021-01-04 DIAGNOSIS — I69354 Hemiplegia and hemiparesis following cerebral infarction affecting left non-dominant side: Secondary | ICD-10-CM

## 2021-01-04 DIAGNOSIS — M6281 Muscle weakness (generalized): Secondary | ICD-10-CM

## 2021-01-04 DIAGNOSIS — R600 Localized edema: Secondary | ICD-10-CM | POA: Diagnosis not present

## 2021-01-04 NOTE — Therapy (Signed)
[facility] [address] [place], [place], [address] Phone: [phone]   Fax:  [phone]  Occupational Therapy Treatment  Patient Details  Name: Shyanne Mcclary MRN: [id number] Date of Birth: [birth year] Referring Provider (OT): Charlotte Crumb   Encounter Date: 01/04/2021   OT End of Session - 01/04/21 1105    Visit Number 11    Number of Visits 17    Date for OT Re-Evaluation 01/25/21    Authorization Type BCBS;  VL:[place]    OT Start Time 1104    OT Stop Time 1145    OT Time Calculation (min) 41 min    Activity Tolerance Patient tolerated treatment well    Behavior During Therapy [facility] for tasks assessed/performed           Past Medical History:  Diagnosis Date  . Diabetes mellitus without complication (HCC)   . Hypertension     History reviewed. No pertinent surgical history.  There were no vitals filed for this visit.   Subjective Assessment - 01/04/21 1106    Subjective  "every now and then this gets weak (LUE) and reaching over here to put on the bra"    Patient Stated Goals Use of left hand    Currently in Pain? No/denies            Hand Gripper: with LUE on level 3 with [place]. Pt picked up 1 inch blocks with gripper with mod drops and mod difficulty. One rest break after approx 2/3 of blocks  Small Pegs pt held dice in LUE for encouraging increased pincer grasp with LUE with mod difficulty and drops with coordination. Pt had mod difficulty with following pattern. Increased time req'd. Pt removed with in hand manipulation.                      OT Short Term Goals - 12/29/20 1423      OT SHORT TERM GOAL #1   Title Patient will complete an HEP designed to improve LUE proximal active movement    Time 4    Period Weeks    Status Achieved    Target Date 12/26/20      OT SHORT TERM GOAL #2   Title Patient will tie shoes with increased time using BUE    Time 4    Period Weeks     Status Achieved      OT SHORT TERM GOAL #3   Title Patient will utilize BUE to wring out washcloth effectively.    Time 4    Period Weeks    Status Achieved      OT SHORT TERM GOAL #4   Title Patient will hold phone in left hand to allow her to send full  text with right hand    Time 4    Period Weeks    Status Achieved   pt reports holding it but is having drops            OT Long Term Goals - 12/29/20 1423      OT LONG TERM GOAL #1   Title Patient will complete an updated HEP designed to improve functional use of LUE with improved coordination, range of motion and strength    Time 8    Period Weeks    Status On-going      OT LONG TERM GOAL #2   Title Patient will complete 9 hole peg test in less than 2 min with LUE to  help pick up and maneuver small items    Baseline over 3 min    Time 8    Period Weeks    Status On-going      OT LONG TERM GOAL #3   Title Patient will don/doff bra with modified independence    Time 8    Period Weeks    Status Achieved   pt reports doing it and it is getting easier.     OT LONG TERM GOAL #4   Title Patient will utilize LUE to wash right side of body without assistance    Time 8    Period Weeks    Status Achieved      OT LONG TERM GOAL #5   Title Patient will safely cut fruit using BUE method    Time 8    Period Weeks    Status On-going   pt reports cutting up vegetables     OT LONG TERM GOAL #6   Title Foto score at discharge    Time 8    Period Weeks    Status New                 Plan - 01/04/21 1129    Clinical Impression Statement Pt progressing towards goals. Continues to work towards increased cooridnation with LUE    OT Occupational Profile and History Detailed Assessment- Review of Records and additional review of physical, cognitive, psychosocial history related to current functional performance    Occupational performance deficits (Please refer to evaluation for details): ADL's;IADL's;Work    Body  Structure / Function / Physical Skills ADL;Coordination;Endurance;GMC;UE functional use;Balance;Decreased knowledge of precautions;Sensation;Vestibular;Vision;Pain;IADL;Flexibility;Decreased knowledge of use of DME;Body mechanics;Cardiopulmonary status limiting activity;Dexterity;FMC;Strength;Tone;ROM;Mobility;Edema    Cognitive Skills Attention;Safety Awareness;Problem Solve;Sequencing    Rehab Potential Good    Clinical Decision Making Several treatment options, min-mod task modification necessary    Comorbidities Affecting Occupational Performance: May have comorbidities impacting occupational performance    Modification or Assistance to Complete Evaluation  No modification of tasks or assist necessary to complete eval    OT Frequency 2x / week    OT Duration 8 weeks    OT Treatment/Interventions Self-care/ADL training;Electrical Stimulation;Therapeutic exercise;Visual/perceptual remediation/compensation;Aquatic Therapy;Neuromuscular education;Splinting;Patient/family education;Building services engineer;Therapeutic activities;Balance training;DME and/or AE instruction;Manual Therapy;Cognitive remediation/compensation    Plan NMR LUE , coordination, discharge vs add visits next session?    OT Home Exercise Plan has supine shoulder exercises    Consulted and Agree with Plan of Care Patient           Patient will benefit from skilled therapeutic intervention in order to improve the following deficits and impairments:   Body Structure / Function / Physical Skills: ADL,Coordination,Endurance,GMC,UE functional use,Balance,Decreased knowledge of precautions,Sensation,Vestibular,Vision,Pain,IADL,Flexibility,Decreased knowledge of use of DME,Body mechanics,Cardiopulmonary status limiting activity,Dexterity,FMC,Strength,Tone,ROM,Mobility,Edema Cognitive Skills: Attention,Safety Awareness,Problem Solve,Sequencing     Visit Diagnosis: Hemiplegia and hemiparesis following cerebral infarction  affecting left non-dominant side (HCC)  Muscle weakness (generalized)  Attention and concentration deficit  Other disturbances of skin sensation  Other lack of coordination    Problem List Patient Active Problem List   Diagnosis Date Noted  . CVA (cerebral vascular accident) (HCC) 10/18/2020  . Primary hypertension   . Left arm weakness   . Left leg weakness   . Subcortical infarction [facility]) 10/12/2020    Junious Dresser MOT, OTR/L  01/04/2021, 12:29 PM  [facility] [address] [place], [place], [address] Phone: [phone]   Fax:  [phone]  Name: Elberta Lachapelle  MRN: [id number] Date of Birth: 03/02/1966

## 2021-01-06 ENCOUNTER — Encounter: Payer: Self-pay | Admitting: Occupational Therapy

## 2021-01-06 ENCOUNTER — Other Ambulatory Visit: Payer: Self-pay

## 2021-01-06 ENCOUNTER — Ambulatory Visit: Payer: BC Managed Care – PPO | Admitting: Occupational Therapy

## 2021-01-06 DIAGNOSIS — R41840 Attention and concentration deficit: Secondary | ICD-10-CM | POA: Diagnosis not present

## 2021-01-06 DIAGNOSIS — R278 Other lack of coordination: Secondary | ICD-10-CM

## 2021-01-06 DIAGNOSIS — R208 Other disturbances of skin sensation: Secondary | ICD-10-CM | POA: Diagnosis not present

## 2021-01-06 DIAGNOSIS — R600 Localized edema: Secondary | ICD-10-CM | POA: Diagnosis not present

## 2021-01-06 DIAGNOSIS — R2681 Unsteadiness on feet: Secondary | ICD-10-CM

## 2021-01-06 DIAGNOSIS — M6281 Muscle weakness (generalized): Secondary | ICD-10-CM | POA: Diagnosis not present

## 2021-01-06 DIAGNOSIS — I69354 Hemiplegia and hemiparesis following cerebral infarction affecting left non-dominant side: Secondary | ICD-10-CM

## 2021-01-06 NOTE — Therapy (Signed)
[facility] [address] [place], [place], [address] Phone: [phone]   Fax:  [phone]  Occupational Therapy Treatment and Discharge Summary  Patient Details  Name: Shannon Garza MRN: [id number] Date of Birth: 1966/07/08 Referring Provider (OT): Maxwell Caul   Encounter Date: 01/06/2021   OT End of Session - 01/06/21 1517    Visit Number 12    Number of Visits 17    Date for OT Re-Evaluation 01/25/21    Authorization Type BCBS;  VL:[place]    OT Start Time 1320    OT Stop Time 1400    OT Time Calculation (min) 40 min    Activity Tolerance Patient tolerated treatment well    Behavior During Therapy [facility] for tasks assessed/performed           Past Medical History:  Diagnosis Date  . Diabetes mellitus without complication ([place])   . Hypertension     History reviewed. No pertinent surgical history.  There were no vitals filed for this visit.   Subjective Assessment - 01/06/21 1328    Subjective  Patient is opting to end OT at this time to allow her to go back to work next week.    Patient Stated Goals Use of left hand    Currently in Pain? No/denies    Pain Score 0-No pain                        OT Treatments/Exercises (OP) - 01/06/21 0001      ADLs   Work Patient is returning to work next week.  Discussed allowing for breaks during work day and to expect mental and physical fatigue.  Patient has the flexibility in her work schedule to log off and take  rest breaks as needed.    ADL Comments Patient has met her OT goals and is agreeable to OT discharge.  Anticiapte that patient may return at a later date to continue to improve functional use of LUE                    OT Short Term Goals - 01/06/21 1330      OT SHORT TERM GOAL #1   Title Patient will complete an HEP designed to improve LUE proximal active movement    Time 4    Period Weeks    Status Achieved    Target Date  12/26/20      OT SHORT TERM GOAL #2   Title Patient will tie shoes with increased time using BUE    Time 4    Period Weeks    Status Achieved      OT SHORT TERM GOAL #3   Title Patient will utilize BUE to wring out washcloth effectively.    Time 4    Period Weeks    Status Achieved      OT SHORT TERM GOAL #4   Title Patient will hold phone in left hand to allow her to send full  text with right hand    Time 4    Period Weeks    Status Achieved   pt reports holding it but is having drops            OT Long Term Goals - 01/06/21 1330      OT LONG TERM GOAL #1   Title Patient will complete an updated HEP designed to improve functional use of LUE with improved coordination, range of motion and strength  Time 8    Period Weeks    Status Achieved      OT LONG TERM GOAL #2   Title Patient will complete 9 hole peg test in less than 2 min with LUE to help pick up and maneuver small items    Baseline over 3 min    Time 8    Period Weeks    Status Achieved   53.85!!     OT LONG TERM GOAL #3   Title Patient will don/doff bra with modified independence    Time 8    Period Weeks    Status Achieved   pt reports doing it and it is getting easier.     OT LONG TERM GOAL #4   Title Patient will utilize LUE to wash right side of body without assistance    Time 8    Period Weeks    Status Achieved      OT LONG TERM GOAL #5   Title Patient will safely cut fruit using BUE method    Time 8    Period Weeks    Status Achieved   pt reports cutting up vegetables     OT LONG TERM GOAL #6   Title Foto score at discharge    Time 8    Period Weeks    Status Achieved                 Plan - 01/06/21 1518    Clinical Impression Statement Pt has met her OT goals, and is returning to work next week.    OT Occupational Profile and History Detailed Assessment- Review of Records and additional review of physical, cognitive, psychosocial history related to current functional  performance    Occupational performance deficits (Please refer to evaluation for details): ADL's;IADL's;Work    Body Structure / Function / Physical Skills ADL;Coordination;Endurance;GMC;UE functional use;Balance;Decreased knowledge of precautions;Sensation;Vestibular;Vision;Pain;IADL;Flexibility;Decreased knowledge of use of DME;Body mechanics;Cardiopulmonary status limiting activity;Dexterity;FMC;Strength;Tone;ROM;Mobility;Edema    Cognitive Skills Attention;Safety Awareness;Problem Solve;Sequencing    Rehab Potential Good    Clinical Decision Making Several treatment options, min-mod task modification necessary    Comorbidities Affecting Occupational Performance: May have comorbidities impacting occupational performance    Modification or Assistance to Complete Evaluation  No modification of tasks or assist necessary to complete eval    OT Frequency 2x / week    OT Duration 8 weeks    OT Treatment/Interventions Self-care/ADL training;Electrical Stimulation;Therapeutic exercise;Visual/perceptual remediation/compensation;Aquatic Therapy;Neuromuscular education;Splinting;Patient/family education;Therapist, nutritional;Therapeutic activities;Balance training;DME and/or AE instruction;Manual Therapy;Cognitive remediation/compensation    Plan NMR LUE , coordination, discharge vs add visits next session?    OT Home Exercise Plan has supine shoulder exercises    Consulted and Agree with Plan of Care Patient           Patient will benefit from skilled therapeutic intervention in order to improve the following deficits and impairments:   Body Structure / Function / Physical Skills: ADL,Coordination,Endurance,GMC,UE functional use,Balance,Decreased knowledge of precautions,Sensation,Vestibular,Vision,Pain,IADL,Flexibility,Decreased knowledge of use of DME,Body mechanics,Cardiopulmonary status limiting activity,Dexterity,FMC,Strength,Tone,ROM,Mobility,Edema Cognitive Skills: Attention,Safety  Awareness,Problem Solve,Sequencing     Visit Diagnosis: Hemiplegia and hemiparesis following cerebral infarction affecting left non-dominant side (HCC)  Muscle weakness (generalized)  Attention and concentration deficit  Other disturbances of skin sensation  Other lack of coordination  Unsteadiness on feet  Localized edema    Problem List Patient Active Problem List   Diagnosis Date Noted  . CVA (cerebral vascular accident) ([place]) 10/18/2020  . Primary hypertension   . Left arm weakness   .  Left leg weakness   . Subcortical infarction ([place]) 10/12/2020  OCCUPATIONAL THERAPY DISCHARGE SUMMARY  Visits from Start of Care:12  Current functional level related to goals / functional outcomes: Improved functional use of LUE with ADL/IADL   Remaining deficits: Hemiplegia, coordination, LUE ROM   Education / Equipment: hep Plan: Patient agrees to discharge.  Patient goals were met. Patient is being discharged due to meeting the stated rehab goals.  ?????      Mariah Milling, OTR/L 01/06/2021, 3:22 PM  [facility] [address] [place], [place], [address] Phone: [phone]   Fax:  [phone]  Name: Shannon Garza MRN: [id number] Date of Birth: 1966/01/06

## 2021-01-12 ENCOUNTER — Encounter: Payer: Self-pay | Admitting: Physical Medicine and Rehabilitation

## 2021-01-12 ENCOUNTER — Ambulatory Visit (INDEPENDENT_AMBULATORY_CARE_PROVIDER_SITE_OTHER): Payer: BC Managed Care – PPO | Admitting: Neurology

## 2021-01-12 ENCOUNTER — Encounter
Payer: BC Managed Care – PPO | Attending: Physical Medicine and Rehabilitation | Admitting: Physical Medicine and Rehabilitation

## 2021-01-12 ENCOUNTER — Encounter: Payer: Self-pay | Admitting: Neurology

## 2021-01-12 ENCOUNTER — Other Ambulatory Visit: Payer: Self-pay

## 2021-01-12 VITALS — BP 152/75 | HR 68 | Temp 99.4°F | Ht 64.0 in | Wt 257.6 lb

## 2021-01-12 VITALS — BP 189/85 | HR 58 | Ht 64.0 in | Wt 259.0 lb

## 2021-01-12 DIAGNOSIS — I10 Essential (primary) hypertension: Secondary | ICD-10-CM | POA: Diagnosis not present

## 2021-01-12 DIAGNOSIS — R29898 Other symptoms and signs involving the musculoskeletal system: Secondary | ICD-10-CM | POA: Insufficient documentation

## 2021-01-12 DIAGNOSIS — I6381 Other cerebral infarction due to occlusion or stenosis of small artery: Secondary | ICD-10-CM

## 2021-01-12 DIAGNOSIS — Z1321 Encounter for screening for nutritional disorder: Secondary | ICD-10-CM

## 2021-01-12 DIAGNOSIS — E538 Deficiency of other specified B group vitamins: Secondary | ICD-10-CM

## 2021-01-12 DIAGNOSIS — I639 Cerebral infarction, unspecified: Secondary | ICD-10-CM | POA: Insufficient documentation

## 2021-01-12 MED ORDER — ATORVASTATIN CALCIUM 40 MG PO TABS: 40.0000 mg | ORAL_TABLET | Freq: Every day | ORAL | 0 refills | Status: DC

## 2021-01-12 MED ORDER — B COMPLEX VITAMINS PO CAPS: 1.0000 | ORAL_CAPSULE | Freq: Every day | ORAL | 3 refills | Status: AC

## 2021-01-12 NOTE — Patient Instructions (Signed)
I had a long d/w patient about her recent lacunar stroke, risk for recurrent stroke/TIAs, personally independently reviewed imaging studies and stroke evaluation results and answered questions.Continue aspirin 81 mg daily  for secondary stroke prevention and maintain strict control of hypertension with blood pressure goal below 130/90, diabetes with hemoglobin A1c goal below 6.5% and lipids with LDL cholesterol goal below 70 mg/dL. I also advised the patient to eat a healthy diet with plenty of whole grains, cereals, fruits and vegetables, exercise regularly and maintain ideal body weight.  Check follow-up lipid profile, hemoglobin A1c and refer for polysomnogram for sleep apnea.  Followup in the future with my nurse practitioner Shanda Bumps in 6 months or call earlier if necessary.  Stroke Prevention Some medical conditions and behaviors are associated with a higher chance of having a stroke. You can help prevent a stroke by making nutrition, lifestyle, and other changes, including managing any medical conditions you may have. What nutrition changes can be made?  Eat healthy foods. You can do this by: ? Choosing foods high in fiber, such as fresh fruits and vegetables and whole grains. ? Eating at least 5 or more servings of fruits and vegetables a day. Try to fill half of your plate at each meal with fruits and vegetables. ? Choosing lean protein foods, such as lean cuts of meat, poultry without skin, fish, tofu, beans, and nuts. ? Eating low-fat dairy products. ? Avoiding foods that are high in salt (sodium). This can help lower blood pressure. ? Avoiding foods that have saturated fat, trans fat, and cholesterol. This can help prevent high cholesterol. ? Avoiding processed and premade foods.  Follow your health care provider's specific guidelines for losing weight, controlling high blood pressure (hypertension), lowering high cholesterol, and managing diabetes. These may include: ? Reducing your daily  calorie intake. ? Limiting your daily sodium intake to 1,500 milligrams (mg). ? Using only healthy fats for cooking, such as olive oil, canola oil, or sunflower oil. ? Counting your daily carbohydrate intake.   What lifestyle changes can be made?  Maintain a healthy weight. Talk to your health care provider about your ideal weight.  Get at least 30 minutes of moderate physical activity at least 5 days a week. Moderate activity includes brisk walking, biking, and swimming.  Do not use any products that contain nicotine or tobacco, such as cigarettes and e-cigarettes. If you need help quitting, ask your health care provider. It may also be helpful to avoid exposure to secondhand smoke.  Limit alcohol intake to no more than 1 drink a day for nonpregnant women and 2 drinks a day for men. One drink equals 12 oz of beer, 5 oz of wine, or 1 oz of hard liquor.  Stop any illegal drug use.  Avoid taking birth control pills. Talk to your health care provider about the risks of taking birth control pills if: ? You are over 53 years old. ? You smoke. ? You get migraines. ? You have ever had a blood clot. What other changes can be made?  Manage your cholesterol levels. ? Eating a healthy diet is important for preventing high cholesterol. If cholesterol cannot be managed through diet alone, you may also need to take medicines. ? Take any prescribed medicines to control your cholesterol as told by your health care provider.  Manage your diabetes. ? Eating a healthy diet and exercising regularly are important parts of managing your blood sugar. If your blood sugar cannot be managed through diet and  exercise, you may need to take medicines. ? Take any prescribed medicines to control your diabetes as told by your health care provider.  Control your hypertension. ? To reduce your risk of stroke, try to keep your blood pressure below 130/80. ? Eating a healthy diet and exercising regularly are an  important part of controlling your blood pressure. If your blood pressure cannot be managed through diet and exercise, you may need to take medicines. ? Take any prescribed medicines to control hypertension as told by your health care provider. ? Ask your health care provider if you should monitor your blood pressure at home. ? Have your blood pressure checked every year, even if your blood pressure is normal. Blood pressure increases with age and some medical conditions.  Get evaluated for sleep disorders (sleep apnea). Talk to your health care provider about getting a sleep evaluation if you snore a lot or have excessive sleepiness.  Take over-the-counter and prescription medicines only as told by your health care provider. Aspirin or blood thinners (antiplatelets or anticoagulants) may be recommended to reduce your risk of forming blood clots that can lead to stroke.  Make sure that any other medical conditions you have, such as atrial fibrillation or atherosclerosis, are managed. What are the warning signs of a stroke? The warning signs of a stroke can be easily remembered as BEFAST.  B is for balance. Signs include: ? Dizziness. ? Loss of balance or coordination. ? Sudden trouble walking.  E is for eyes. Signs include: ? A sudden change in vision. ? Trouble seeing.  F is for face. Signs include: ? Sudden weakness or numbness of the face. ? The face or eyelid drooping to one side.  A is for arms. Signs include: ? Sudden weakness or numbness of the arm, usually on one side of the body.  S is for speech. Signs include: ? Trouble speaking (aphasia). ? Trouble understanding.  T is for time. ? These symptoms may represent a serious problem that is an emergency. Do not wait to see if the symptoms will go away. Get medical help right away. Call your local emergency services (911 in the [place] Do not drive yourself to the hospital.  Other signs of stroke may include: ? A sudden,  severe headache with no known cause. ? Nausea or vomiting. ? Seizure. Where to find more information For more information, visit:  American Stroke Association: www.strokeassociation.org  National Stroke Association: www.stroke.org Summary  You can prevent a stroke by eating healthy, exercising, not smoking, limiting alcohol intake, and managing any medical conditions you may have.  Do not use any products that contain nicotine or tobacco, such as cigarettes and e-cigarettes. If you need help quitting, ask your health care provider. It may also be helpful to avoid exposure to secondhand smoke.  Remember BEFAST for warning signs of stroke. Get help right away if you or a loved one has any of these signs. This information is not intended to replace advice given to you by your health care provider. Make sure you discuss any questions you have with your health care provider. Document Revised: 07/27/2017 Document Reviewed: 09/19/2016 Elsevier Patient Education  2021 [employer].

## 2021-01-12 NOTE — Progress Notes (Signed)
[facility] [address] [place]. [place] [address] [phone]       OFFICE FOLLOW-UP NOTE  [name] Date of Birth:  1966-03-01 Medical Record Number:  [id number]   HPI: [name] is a 55 year old African-American lady seen today for initial office follow-up visit following hospital admission for stroke in February 2022.  History is obtained from the patient and review of electronic medical records and I personally reviewed pertinent available imaging films in PACS. She has past medical history of diabetes, hypertension and obesity.  She was admitted to [facility] on 10/12/2020 when she woke up from sleep in the morning with leg weakness.  She did not seek help and the symptoms worsened and she also developed weakness in her arm seeking her to get medical attention the next day.  CT head showed no acute abnormality only chronic changes of small vessel disease and remote age left basal ganglia and chronic right basal ganglia lacunar infarcts.  CT angiogram of the head and neck showed no large vessel stenosis or occlusion.  MRI scan confirmed acute infarct in the posterior limb of the internal capsule on the right and extending into corona radiata.  Remote age left basal ganglia and left pontine lacunar infarcts were also noted.  2D echo showed ejection fraction of 60 to 65%.  LDL cholesterol is elevated 144 mg percent.  Hemoglobin A1c was 5.6.  Urine drug screen was negative.  Patient was started on dual antiplatelet therapy of aspirin and Plavix with plans to switch to aspirin alone after 3 weeks.  Patient states she is doing much better.  She is finished her outpatient physical and occupational therapy.  She is able to walk independently.  She still has some diminished dexterity in her hand and fine motor skills.  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  She is still on short-term disability.  Patient is tolerating aspirin well without significant bruising or  bleeding.  She states her blood pressure is quite well controlled at home though it is elevated in the office at 189/85.  She is tolerating Lipitor well without muscle aches and pains.  She has not had any follow-up lipid profile checked.  The patient admits to snoring at times but she is never been evaluated for sleep apnea and knows that she is at risk.  She denies any prior history of strokes, TIAs, DVT, pulm embolism or recurrent miscarriages.  There is no family Struve strokes at a young age. ROS:   14 system review of systems is positive for weakness, decreased dexterity, gait difficulty and all other systems negative PMH:  Past Medical History:  Diagnosis Date  . Diabetes mellitus without complication (HCC)   . Hypertension     Social History:  Social History   Socioeconomic History  . Marital status: Single    Spouse name: Not on file  . Number of children: Not on file  . Years of education: Not on file  . Highest education level: Not on file  Occupational History  . Not on file  Tobacco Use  . Smoking status: Never Smoker  . Smokeless tobacco: Never Used  Vaping Use  . Vaping Use: Never used  Substance and Sexual Activity  . Alcohol use: Not Currently  . Drug use: Not Currently  . Sexual activity: Not Currently  Other Topics Concern  . Not on file  Social History Narrative   Lives with sister in law and brother, and sister in own  home   Right Handed   Drinks no caffeine   Social Determinants of Health   Financial Resource Strain: Not on file  Food Insecurity: Not on file  Transportation Needs: Not on file  Physical Activity: Not on file  Stress: Not on file  Social Connections: Not on file  Intimate Partner Violence: Not on file    Medications:   Current Outpatient Medications on File Prior to Visit  Medication Sig Dispense Refill  . acetaminophen (TYLENOL) 325 MG tablet Take 2 tablets (650 mg total) by mouth every 4 (four) hours as needed for mild pain (or  temp > 37.5 C (99.5 F)).    Marland Kitchen aspirin 81 MG chewable tablet Chew 1 tablet (81 mg total) by mouth daily.    Marland Kitchen atorvastatin (LIPITOR) 40 MG tablet Take 1 tablet (40 mg total) by mouth at bedtime. 30 tablet 0  . atorvastatin (LIPITOR) 40 MG tablet TAKE 1 TABLET (40 MG TOTAL) BY MOUTH AT BEDTIME. 30 tablet 0  . lisinopril (ZESTRIL) 40 MG tablet Take 1 tablet (40 mg total) by mouth daily. 30 tablet 2  . Turmeric (QC TUMERIC COMPLEX PO) Take 1 tablet by mouth daily.     No current facility-administered medications on file prior to visit.    Allergies:  No Known Allergies  Physical Exam General: Obese middle-aged African-American lady seated, in no evident distress Head: head normocephalic and atraumatic.  Neck: supple with no carotid or supraclavicular bruits Cardiovascular: regular rate and rhythm, no murmurs Musculoskeletal: no deformity Skin:  no rash/petichiae Vascular:  Normal pulses all extremities Vitals:   01/12/21 0901  BP: (!) 189/85  Pulse: (!) 58   Neurologic Exam Mental Status: Awake and fully alert. Oriented to place and time. Recent and remote memory intact. Attention span, concentration and fund of knowledge appropriate. Mood and affect appropriate.  Cranial Nerves: Fundoscopic exam reveals sharp disc margins. Pupils equal, briskly reactive to light. Extraocular movements full without nystagmus. Visual fields full to confrontation. Hearing intact. Facial sensation intact. Face, tongue, palate moves normally and symmetrically.  Motor: Normal bulk and tone. Normal strength in all tested extremity muscles except mild weakness of right grip and diminished fine finger movements on the right.  Orbits left over right upper extremity.. Sensory.: intact to touch ,pinprick .position and vibratory sensation.  Coordination: Rapid alternating movements normal in all extremities. Finger-to-nose and heel-to-shin performed accurately bilaterally. Gait and Station: Arises from chair without  difficulty. Stance is normal. Gait demonstrates normal stride length and balance . Able to heel, toe and tandem walk with mild difficulty.  Reflexes: 1+ and symmetric. Toes downgoing.   NIHSS  0 Modified Rankin  2  ASSESSMENT: 55 year old African-American lady with right subcortical infarct in February 2022 likely from small vessel disease.  Vascular risk factors of hypertension, hyperlipidemia, obesity and suspected sleep apnea.     PLAN: I had a long d/w patient about her recent lacunar stroke, risk for recurrent stroke/TIAs, personally independently reviewed imaging studies and stroke evaluation results and answered questions.Continue aspirin 81 mg daily  for secondary stroke prevention and maintain strict control of hypertension with blood pressure goal below 130/90, diabetes with hemoglobin A1c goal below 6.5% and lipids with LDL cholesterol goal below 70 mg/dL. I also advised the patient to eat a healthy diet with plenty of whole grains, cereals, fruits and vegetables, exercise regularly and maintain ideal body weight.  Check follow-up lipid profile, hemoglobin A1c and refer for polysomnogram for sleep apnea.  Followup in the future  with my nurse practitioner Shanda Bumps in 6 months or call earlier if necessary.Greater than 50% of time during this 25 minute visit was spent on counseling,explanation of diagnosis of lacunar stroke, planning of further management, discussion with patient and family and coordination of care Delia Heady, MD Note: This document was prepared with digital dictation and possible smart phrase technology. Any transcriptional errors that result from this process are unintentional

## 2021-01-12 NOTE — Progress Notes (Signed)
Subjective:    Patient ID: Shannon Garza, female    DOB: 1965/10/13, 55 y.o.   MRN: [id number]  HPI  Shannon Garza presents for post-stroke hospital f/u.  1) Weakness in left side -she feels weaker some times at night -she was concerned regarding whether she was having repeat stroke -she is doing well with walking.  2) HTN: -BP is 152/75 today -she has been taking lisinopril 40mg  daily.   3) obesity: -lost about 6-7 lbs.  -she started walking yesterday  4) diabetes: has been 82 when she checked at home.  -she been very careful with her diet.   5) fatigue:  -sister in law concerned about this and recommended that she postpone her return to work.   6) h/o stroke: -followed yo with Dr.   Pain Inventory Average Pain 2 Pain Right Now 2 My pain is dull  LOCATION OF PAIN  Wrist, hand   BOWEL Number of stools per week: 8 Oral laxative use Yes  Type of laxative herbal laxative OTC Enema or suppository use No  History of colostomy No  Incontinent No   BLADDER Normal In and out cath, frequency n/a Able to self cath No  Bladder incontinence No  Frequent urination No  Leakage with coughing No  Difficulty starting stream No  Incomplete bladder emptying No    Mobility walk without assistance how many minutes can you walk? 15 ability to climb steps?  yes do you drive?  yes Do you have any goals in this area?  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  yes  Neuro/Psych weakness  Prior Studies Any changes since last visit?  no  Physicians involved in your care Neurologist Dr. Benita Stabile   No family history on file. Social History   Socioeconomic History  . Marital status: Single    Spouse name: Not on file  . Number of children: Not on file  . Years of education: Not on file  . Highest education level: Not on file  Occupational  History  . Not on file  Tobacco Use  . Smoking status: Never Smoker  . Smokeless tobacco: Never Used  Vaping Use  . Vaping Use: Never used  Substance and Sexual Activity  . Alcohol use: Not Currently  . Drug use: Not Currently  . Sexual activity: Not Currently  Other Topics Concern  . Not on file  Social History Narrative   Lives with sister in law and brother, and sister in own home   Right Handed   Drinks no caffeine   Social Determinants of Health   Financial Resource Strain: Not on file  Food Insecurity: Not on file  Transportation Needs: Not on file  Physical Activity: Not on file  Stress: Not on file  Social Connections: Not on file   No past surgical history on file. Past Medical History:  Diagnosis Date  . Diabetes mellitus without complication (HCC)   . Hypertension    BP (!) 152/75   Pulse 68   Temp 99.4 F (37.4 C)   Ht 5\' 4"  (1.626 m)   Wt 257 lb 9.6 oz (116.8 kg)   SpO2 95%   BMI 44.22 kg/m   Opioid Risk Score:   Fall Risk Score:  `1  Depression screen PHQ 2/9  Depression screen [facility] 2/9 12/24/2020 12/01/2020  Decreased Interest 0 2  Down, Depressed, Hopeless  0 0  PHQ - 2 Score 0 2  Altered sleeping - 0  Tired, decreased energy - 1  Change in appetite - 0  Feeling bad or failure about yourself  - 0  Trouble concentrating - 0  Moving slowly or fidgety/restless - 1  Suicidal thoughts - 0  PHQ-9 Score - 4    Review of Systems  Constitutional: Negative.   HENT: Negative.   Eyes: Negative.   Respiratory: Negative.   Cardiovascular: Negative.   Gastrointestinal: Negative.   Endocrine: Negative.   Genitourinary: Negative.   Musculoskeletal: Negative.   Skin: Negative.   Allergic/Immunologic: Negative.   Neurological: Positive for weakness.  Hematological: Negative.   Psychiatric/Behavioral: Negative.   All other systems reviewed and are negative.      Objective:   Physical Exam Gen: no distress, normal appearing HEENT: oral mucosa  pink and moist, NCAT Cardio: Reg rate Chest: normal effort, normal rate of breathing Abd: soft, non-distended Ext: no edema Psych: pleasant, normal affect Skin: intact Neuro: Alert and oriented x3. Musculoskeletal: 4/5 strength in left upper and lower extremity.     Assessment & Plan:  Shannon Garza is a 55 year old woman who presents for post-stroke follow-up.   1) Subcortical infarction -conitnue HEP -discussed weakness and prognosis -walk 15 minutes per day.   2) Prediabetes: -Check HgbA1c today.   3) HTN: -refilled lisinopril -Reviewed BP meds today.  -BP is 152/75 today.  -Advised checking BP daily at home and logging results to bring into follow-up appointment with her PCP and myself. -Reviewed BP meds today.  -Advised regarding healthy foods that can help lower blood pressure and provided with a list: 1) citrus foods- high in vitamins and minerals 2) salmon and other fatty fish - reduces inflammation and oxylipins 3) swiss chard (leafy green)- high level of nitrates 4) pumpkin seeds- one of the best natural sources of magnesium 5) Beans and lentils- high in fiber, magnesium, and potassium 6) Berries- high in flavonoids 7) Amaranth (whole grain, can be cooked similarly to rice and oats)- high in magnesium and fiber 8) Pistachios- even more effective at reducing BP than other nuts 9) Carrots- high in phenolic compounds that relax blood vessels and reduce inflammation 10) Celery- contain phthalides that relax tissues of arterial walls 11) Tomatoes- can also improve cholesterol and reduce risk of heart disease 12) Broccoli- good source of magnesium, calcium, and potassium 13) Greek yogurt: high in potassium and calcium 14) Herbs and spices: Celery seed, cilantro, saffron, lemongrass, black cumin, ginseng, cinnamon, cardamom, sweet basil, and ginger 15) Chia and flax seeds- also help to lower cholesterol and blood sugar 16) Beets- high levels of nitrates that relax blood  vessels  17) spinach and bananas- high in potassium  -Provided lise of supplements that can help with hypertension:  1) magnesium: one high quality brand is Bioptemizers since it contains all 7 types of magnesium, otherwise over the counter magnesium gluconate 400mg  is a good option 2) B vitamins 3) vitamin D 4) potassium 5) CoQ10 6) L-arginine 7) Vitamin C 8) Beetroot -Educated that goal BP is 120/80. -Made goal to incorporate some of the above foods into diet.  -Check Vitamin D, potassium, B12 folate,   4) Obesity: Weight is 257 lbs-continue to monitor. Lost 14 lbs!

## 2021-01-12 NOTE — Patient Instructions (Signed)
HTN:  -Advised regarding healthy foods that can help lower blood pressure and provided with a list: 1) citrus foods- high in vitamins and minerals 2) salmon and other fatty fish - reduces inflammation and oxylipins 3) swiss chard (leafy green)- high level of nitrates 4) pumpkin seeds- one of the best natural sources of magnesium 5) Beans and lentils- high in fiber, magnesium, and potassium 6) Berries- high in flavonoids 7) Amaranth (whole grain, can be cooked similarly to rice and oats)- high in magnesium and fiber 8) Pistachios- even more effective at reducing BP than other nuts 9) Carrots- high in phenolic compounds that relax blood vessels and reduce inflammation 10) Celery- contain phthalides that relax tissues of arterial walls 11) Tomatoes- can also improve cholesterol and reduce risk of heart disease 12) Broccoli- good source of magnesium, calcium, and potassium 13) Greek yogurt: high in potassium and calcium 14) Herbs and spices: Celery seed, cilantro, saffron, lemongrass, black cumin, ginseng, cinnamon, cardamom, sweet basil, and ginger 15) Chia and flax seeds- also help to lower cholesterol and blood sugar 16) Beets- high levels of nitrates that relax blood vessels  17) spinach and bananas- high in potassium  -Provided lise of supplements that can help with hypertension:  1) magnesium: one high quality brand is Bioptemizers since it contains all 7 types of magnesium, otherwise over the counter magnesium gluconate 400mg is a good option 2) B vitamins 3) vitamin D 4) potassium 5) CoQ10 6) L-arginine 7) Vitamin C 8) Beetroot -Educated that goal BP is 120/80. -Made goal to incorporate some of the above foods into diet.   

## 2021-01-13 ENCOUNTER — Encounter: Payer: BC Managed Care – PPO | Admitting: Physical Medicine and Rehabilitation

## 2021-01-13 LAB — BASIC METABOLIC PANEL
BUN/Creatinine Ratio: 14 (ref 9–23)
BUN: 11 mg/dL (ref 6–24)
CO2: 23 mmol/L (ref 20–29)
Calcium: 10.9 mg/dL — ABNORMAL HIGH (ref 8.7–10.2)
Chloride: 105 mmol/L (ref 96–106)
Creatinine, Ser: 0.78 mg/dL (ref 0.57–1.00)
Glucose: 96 mg/dL (ref 65–99)
Potassium: 4.5 mmol/L (ref 3.5–5.2)
Sodium: 141 mmol/L (ref 134–144)
eGFR: 91 mL/min/{1.73_m2} (ref >59)

## 2021-01-13 LAB — LIPID PANEL W/O CHOL/HDL RATIO
Cholesterol, Total: 196 mg/dL (ref 100–199)
HDL: 45 mg/dL (ref >39)
LDL Chol Calc (NIH): 134 mg/dL — ABNORMAL HIGH (ref 0–99)
Triglycerides: 95 mg/dL (ref 0–149)
VLDL Cholesterol Cal: 17 mg/dL (ref 5–40)

## 2021-01-13 LAB — B12 AND FOLATE PANEL
Folate: 11.6 ng/mL (ref >3.0)
Vitamin B-12: 766 pg/mL (ref 232–1245)

## 2021-01-13 LAB — VITAMIN D 25 HYDROXY (VIT D DEFICIENCY, FRACTURES): Vit D, 25-Hydroxy: 23.1 ng/mL — ABNORMAL LOW (ref 30.0–100.0)

## 2021-01-13 MED ORDER — VITAMIN D (ERGOCALCIFEROL) 1.25 MG (50000 UNIT) PO CAPS: 50000.0000 [IU] | ORAL_CAPSULE | ORAL | 0 refills | Status: DC

## 2021-01-13 NOTE — Addendum Note (Signed)
Addended by: Horton Chin on: 01/13/2021 08:39 AM   Modules accepted: Orders

## 2021-01-17 ENCOUNTER — Other Ambulatory Visit: Payer: Self-pay

## 2021-01-17 ENCOUNTER — Encounter: Payer: Self-pay | Admitting: Emergency Medicine

## 2021-01-17 ENCOUNTER — Ambulatory Visit (INDEPENDENT_AMBULATORY_CARE_PROVIDER_SITE_OTHER): Payer: BC Managed Care – PPO | Admitting: Emergency Medicine

## 2021-01-17 VITALS — BP 146/90 | HR 59 | Temp 98.7°F | Ht 64.0 in | Wt 256.0 lb

## 2021-01-17 DIAGNOSIS — I10 Essential (primary) hypertension: Secondary | ICD-10-CM

## 2021-01-17 DIAGNOSIS — Z7689 Persons encountering health services in other specified circumstances: Secondary | ICD-10-CM

## 2021-01-17 DIAGNOSIS — Z1231 Encounter for screening mammogram for malignant neoplasm of breast: Secondary | ICD-10-CM

## 2021-01-17 DIAGNOSIS — Z8673 Personal history of transient ischemic attack (TIA), and cerebral infarction without residual deficits: Secondary | ICD-10-CM

## 2021-01-17 DIAGNOSIS — E785 Hyperlipidemia, unspecified: Secondary | ICD-10-CM

## 2021-01-17 MED ORDER — LOSARTAN POTASSIUM-HCTZ 50-12.5 MG PO TABS: 1.0000 | ORAL_TABLET | Freq: Every day | ORAL | 3 refills | Status: DC

## 2021-01-17 NOTE — Assessment & Plan Note (Signed)
Well-controlled hypertension.  Change lisinopril to losartan-HCTZ 50- 12.5 mg daily. Advised to continue monitoring blood pressure readings at home and keep a log to show me at a later visit.

## 2021-01-17 NOTE — Assessment & Plan Note (Signed)
Making progress.  Continue baby aspirin daily. Secondary prevention is the main goal. Diet and nutrition discussed.  Importance of controlling hypertension and dyslipidemia discussed.

## 2021-01-17 NOTE — Patient Instructions (Addendum)

## 2021-01-17 NOTE — Progress Notes (Signed)
Shannon Garza 55 y.o.   Chief Complaint  Patient presents with  . New Patient (Initial Visit)    Pt has no concerns today    HISTORY OF PRESENT ILLNESS: This is a 55 y.o. female first visit to this office, here to establish care with me. Has history of strokes in the past, first in 2017 and second on 10/12/2020. Most recent neurologist office visit notes as follows:  ASSESSMENT: 55 year old African-American lady with right subcortical infarct in February 2022 likely from small vessel disease.  Vascular risk factors of hypertension, hyperlipidemia, obesity and suspected sleep apnea. PLAN: I had a long d/w patient about her recent lacunar stroke, risk for recurrent stroke/TIAs, personally independently reviewed imaging studies and stroke evaluation results and answered questions.Continue aspirin 81 mg daily  for secondary stroke prevention and maintain strict control of hypertension with blood pressure goal below 130/90, diabetes with hemoglobin A1c goal below 6.5% and lipids with LDL cholesterol goal below 70 mg/dL. I also advised the patient to eat a healthy diet with plenty of whole grains, cereals, fruits and vegetables, exercise regularly and maintain ideal body weight.  Check follow-up lipid profile, hemoglobin A1c and refer for polysomnogram for sleep apnea.  Followup in the future with my nurse practitioner Shanda Bumps in 6 months or call earlier if necessary.Greater than 50% of time during this 25 minute visit was spent on counseling,explanation of diagnosis of lacunar stroke, planning of further management, discussion with patient and family and coordination of care Delia Heady, MD  Has history of hypertension presently on lisinopril 40 mg daily but blood pressure readings at home still slightly elevated. History of dyslipidemia on atorvastatin 40 mg daily. Takes 1 baby aspirin daily. Has history of diabetes but states A1c has been normal.  Presently on no medications. Lab Results   Component Value Date   HGBA1C 5.6 10/13/2020  Responded very well to physical therapy.  Deficits much improved. Recent echocardiogram done in February 2022 as follows: IMPRESSIONS     1. Left ventricular ejection fraction, by estimation, is 60 to 65%. The  left ventricle has normal function. The left ventricle has no regional  wall motion abnormalities. There is moderate left ventricular hypertrophy.  Left ventricular diastolic  parameters are indeterminate.   2. Right ventricular systolic function was not well visualized. The right  ventricular size is not well visualized. Tricuspid regurgitation signal is  inadequate for assessing PA pressure.   3. The mitral valve is normal in structure. No evidence of mitral valve  regurgitation.   4. The aortic valve is tricuspid. Aortic valve regurgitation is not  visualized. No aortic stenosis is present.     HPI   Prior to Admission medications   Medication Sig Start Date End Date Taking? Authorizing Provider  aspirin 81 MG chewable tablet Chew 1 tablet (81 mg total) by mouth daily. 10/19/20  Yes Westley Chandler, MD  atorvastatin (LIPITOR) 40 MG tablet Take 1 tablet (40 mg total) by mouth at bedtime. 01/12/21  Yes Raulkar, Drema Pry, MD  b complex vitamins capsule Take 1 capsule by mouth daily. 01/12/21  Yes Raulkar, Drema Pry, MD  lisinopril (ZESTRIL) 40 MG tablet Take 1 tablet (40 mg total) by mouth daily. 12/24/20  Yes Raulkar, Drema Pry, MD  Turmeric (QC TUMERIC COMPLEX PO) Take 1 tablet by mouth daily.   Yes [provider]  Vitamin D, Ergocalciferol, (DRISDOL) 1.25 MG (50000 UNIT) CAPS capsule Take 1 capsule (50,000 Units total) by mouth every 7 (seven) days. 01/13/21  Yes Raulkar, Drema Pry, MD  acetaminophen (TYLENOL) 325 MG tablet Take 2 tablets (650 mg total) by mouth every 4 (four) hours as needed for mild pain (or temp > 37.5 C (99.5 F)). Patient not taking: Reported on 01/17/2021 10/18/20   Westley Chandler, MD  atorvastatin  (LIPITOR) 40 MG tablet TAKE 1 TABLET (40 MG TOTAL) BY MOUTH AT BEDTIME. Patient not taking: Reported on 01/17/2021 11/01/20 11/01/21  Angiulli, Mcarthur Rossetti, PA-C    No Known Allergies  Patient Active Problem List   Diagnosis Date Noted  . CVA (cerebral vascular accident) (HCC) 10/18/2020  . Primary hypertension   . Left arm weakness   . Left leg weakness   . Subcortical infarction (HCC) 10/12/2020    Past Medical History:  Diagnosis Date  . Diabetes mellitus without complication (HCC)   . Hypertension     No past surgical history on file.  Social History   Socioeconomic History  . Marital status: Single    Spouse name: Not on file  . Number of children: Not on file  . Years of education: Not on file  . Highest education level: Not on file  Occupational History  . Not on file  Tobacco Use  . Smoking status: Never Smoker  . Smokeless tobacco: Never Used  Vaping Use  . Vaping Use: Never used  Substance and Sexual Activity  . Alcohol use: Not Currently  . Drug use: Not Currently  . Sexual activity: Not Currently  Other Topics Concern  . Not on file  Social History Narrative   Lives with sister in law and brother, and sister in own home   Right Handed   Drinks no caffeine   Social Determinants of Health   Financial Resource Strain: Not on file  Food Insecurity: Not on file  Transportation Needs: Not on file  Physical Activity: Not on file  Stress: Not on file  Social Connections: Not on file  Intimate Partner Violence: Not on file    No family history on file.   Review of Systems  Constitutional: Negative.  Negative for chills and fever.  HENT: Negative.  Negative for congestion and sore throat.   Respiratory: Negative.  Negative for cough and shortness of breath.   Cardiovascular: Negative.  Negative for chest pain and palpitations.  Gastrointestinal: Negative.  Negative for abdominal pain, diarrhea, nausea and vomiting.  Genitourinary: Negative.  Negative for  dysuria and hematuria.  Skin: Negative.  Negative for rash.  Neurological: Negative.  Negative for dizziness and headaches.  All other systems reviewed and are negative.  Today's Vitals   01/17/21 1105  BP: (!) 146/90  Pulse: (!) 59  Temp: 98.7 F (37.1 C)  TempSrc: Oral  SpO2: 99%  Weight: 256 lb (116.1 kg)  Height: 5\' 4"  (1.626 m)   Body mass index is 43.94 kg/m. BP Readings from Last 3 Encounters:  01/17/21 (!) 146/90  01/12/21 (!) 152/75  01/12/21 (!) 189/85   Lab Results  Component Value Date   CREATININE 0.78 01/12/2021   BUN 11 01/12/2021   NA 141 01/12/2021   K 4.5 01/12/2021   CL 105 01/12/2021   CO2 23 01/12/2021   Lab Results  Component Value Date   CHOL 196 01/12/2021   HDL 45 01/12/2021   LDLCALC 134 (H) 01/12/2021   TRIG 95 01/12/2021   CHOLHDL 4.0 10/13/2020     Physical Exam Vitals reviewed.  Constitutional:      Appearance: Normal appearance. She is obese.  HENT:     Head: Normocephalic.  Eyes:     Extraocular Movements: Extraocular movements intact.     Conjunctiva/sclera: Conjunctivae normal.     Pupils: Pupils are equal, round, and reactive to light.  Cardiovascular:     Rate and Rhythm: Normal rate and regular rhythm.     Pulses: Normal pulses.     Heart sounds: Normal heart sounds.  Pulmonary:     Effort: Pulmonary effort is normal.     Breath sounds: Normal breath sounds.  Musculoskeletal:        General: Normal range of motion.     Cervical back: Normal range of motion and neck supple.     Right lower leg: No edema.     Left lower leg: No edema.  Skin:    General: Skin is warm and dry.     Capillary Refill: Capillary refill takes less than 2 seconds.  Neurological:     General: No focal deficit present.     Mental Status: She is alert and oriented to person, place, and time.  Psychiatric:        Mood and Affect: Mood normal.        Behavior: Behavior normal.      ASSESSMENT & PLAN: A total of 45 minutes was spent  with the patient and counseling/coordination of care regarding secondary prevention of strokes, hypertension and dyslipidemia and cardiovascular risks associated with these conditions, review of all medications and changes made, education on nutrition, review of most recent neurologist office visit notes, review of most recent blood work results, review of most recent echocardiogram and brain imaging results, prognosis, documentation, and need for follow-up.  Primary hypertension Well-controlled hypertension.  Change lisinopril to losartan-HCTZ 50- 12.5 mg daily. Advised to continue monitoring blood pressure readings at home and keep a log to show me at a later visit.  History of stroke Making progress.  Continue baby aspirin daily. Secondary prevention is the main goal. Diet and nutrition discussed.  Importance of controlling hypertension and dyslipidemia discussed.  Dyslipidemia Diet and nutrition discussed.  Continue atorvastatin 40 mg daily.  Allisa was seen today for new patient (initial visit).  Diagnoses and all orders for this visit:  Primary hypertension -     losartan-hydrochlorothiazide (HYZAAR) 50-12.5 MG tablet; Take 1 tablet by mouth daily.  Encounter for screening mammogram for malignant neoplasm of breast -     MM Digital Diagnostic Bilat; Future  History of stroke  Encounter to establish care  Dyslipidemia  Morbid obesity (HCC)    Patient Instructions   Hypertension, Adult  High blood pressure (hypertension) is when the force of blood pumping through the arteries is too strong. The arteries are the blood vessels that carry blood from the heart throughout the body. Hypertension forces the heart to work harder to pump blood and may cause arteries to become narrow or stiff. Untreated or uncontrolled hypertension can cause a heart attack, heart failure, a stroke, kidney disease, and other problems. A blood pressure reading consists of a higher number over a lower  number. Ideally, your blood pressure should be below 120/80. The first ("top") number is called the systolic pressure. It is a measure of the pressure in your arteries as your heart beats. The second ("bottom") number is called the diastolic pressure. It is a measure of the pressure in your arteries as the heart relaxes. What are the causes? The exact cause of this condition is not known. There are some conditions that result in  or are related to high blood pressure. What increases the risk? Some risk factors for high blood pressure are under your control. The following factors may make you more likely to develop this condition:  Smoking.  Having type 2 diabetes mellitus, high cholesterol, or both.  Not getting enough exercise or physical activity.  Being overweight.  Having too much fat, sugar, calories, or salt (sodium) in your diet.  Drinking too much alcohol. Some risk factors for high blood pressure may be difficult or impossible to change. Some of these factors include:  Having chronic kidney disease.  Having a family history of high blood pressure.  Age. Risk increases with age.  Race. You may be at higher risk if you are African American.  Gender. Men are at higher risk than women before age 62. After age 3, women are at higher risk than men.  Having obstructive sleep apnea.  Stress. What are the signs or symptoms? High blood pressure may not cause symptoms. Very high blood pressure (hypertensive crisis) may cause:  Headache.  Anxiety.  Shortness of breath.  Nosebleed.  Nausea and vomiting.  Vision changes.  Severe chest pain.  Seizures. How is this diagnosed? This condition is diagnosed by measuring your blood pressure while you are seated, with your arm resting on a flat surface, your legs uncrossed, and your feet flat on the floor. The cuff of the blood pressure monitor will be placed directly against the skin of your upper arm at the level of your  heart. It should be measured at least twice using the same arm. Certain conditions can cause a difference in blood pressure between your right and left arms. Certain factors can cause blood pressure readings to be lower or higher than normal for a short period of time:  When your blood pressure is higher when you are in a health care provider's office than when you are at home, this is called white coat hypertension. Most people with this condition do not need medicines.  When your blood pressure is higher at home than when you are in a health care provider's office, this is called masked hypertension. Most people with this condition may need medicines to control blood pressure. If you have a high blood pressure reading during one visit or you have normal blood pressure with other risk factors, you may be asked to:  Return on a different day to have your blood pressure checked again.  Monitor your blood pressure at home for 1 week or longer. If you are diagnosed with hypertension, you may have other blood or imaging tests to help your health care provider understand your overall risk for other conditions. How is this treated? This condition is treated by making healthy lifestyle changes, such as eating healthy foods, exercising more, and reducing your alcohol intake. Your health care provider may prescribe medicine if lifestyle changes are not enough to get your blood pressure under control, and if:  Your systolic blood pressure is above 130.  Your diastolic blood pressure is above 80. Your personal target blood pressure may vary depending on your medical conditions, your age, and other factors. Follow these instructions at home: Eating and drinking  Eat a diet that is high in fiber and potassium, and low in sodium, added sugar, and fat. An example eating plan is called the DASH (Dietary Approaches to Stop Hypertension) diet. To eat this way: ? Eat plenty of fresh fruits and vegetables. Try to  fill one half of your plate at  each meal with fruits and vegetables. ? Eat whole grains, such as whole-wheat pasta, brown rice, or whole-grain bread. Fill about one fourth of your plate with whole grains. ? Eat or drink low-fat dairy products, such as skim milk or low-fat yogurt. ? Avoid fatty cuts of meat, processed or cured meats, and poultry with skin. Fill about one fourth of your plate with lean proteins, such as fish, chicken without skin, beans, eggs, or tofu. ? Avoid pre-made and processed foods. These tend to be higher in sodium, added sugar, and fat.  Reduce your daily sodium intake. Most people with hypertension should eat less than 1,500 mg of sodium a day.  Do not drink alcohol if: ? Your health care provider tells you not to drink. ? You are pregnant, may be pregnant, or are planning to become pregnant.  If you drink alcohol: ? Limit how much you use to:  0-1 drink a day for women.  0-2 drinks a day for men. ? Be aware of how much alcohol is in your drink. In the [place] one drink equals one 12 oz bottle of beer (355 mL), one 5 oz glass of wine (148 mL), or one 1 oz glass of hard liquor (44 mL).   Lifestyle  Work with your health care provider to maintain a healthy body weight or to lose weight. Ask what an ideal weight is for you.  Get at least 30 minutes of exercise most days of the week. Activities may include walking, swimming, or biking.  Include exercise to strengthen your muscles (resistance exercise), such as Pilates or lifting weights, as part of your weekly exercise routine. Try to do these types of exercises for 30 minutes at least 3 days a week.  Do not use any products that contain nicotine or tobacco, such as cigarettes, e-cigarettes, and chewing tobacco. If you need help quitting, ask your health care provider.  Monitor your blood pressure at home as told by your health care provider.  Keep all follow-up visits as told by your health care provider. This is  important.   Medicines  Take over-the-counter and prescription medicines only as told by your health care provider. Follow directions carefully. Blood pressure medicines must be taken as prescribed.  Do not skip doses of blood pressure medicine. Doing this puts you at risk for problems and can make the medicine less effective.  Ask your health care provider about side effects or reactions to medicines that you should watch for. Contact a health care provider if you:  Think you are having a reaction to a medicine you are taking.  Have headaches that keep coming back (recurring).  Feel dizzy.  Have swelling in your ankles.  Have trouble with your vision. Get help right away if you:  Develop a severe headache or confusion.  Have unusual weakness or numbness.  Feel faint.  Have severe pain in your chest or abdomen.  Vomit repeatedly.  Have trouble breathing. Summary  Hypertension is when the force of blood pumping through your arteries is too strong. If this condition is not controlled, it may put you at risk for serious complications.  Your personal target blood pressure may vary depending on your medical conditions, your age, and other factors. For most people, a normal blood pressure is less than 120/80.  Hypertension is treated with lifestyle changes, medicines, or a combination of both. Lifestyle changes include losing weight, eating a healthy, low-sodium diet, exercising more, and limiting alcohol. This information is not intended  to replace advice given to you by your health care provider. Make sure you discuss any questions you have with your health care provider. Document Revised: 04/24/2018 Document Reviewed: 04/24/2018 Elsevier Patient Education  2021 Elsevier Inc.     Edwina Barth, MD [facility]

## 2021-01-17 NOTE — Assessment & Plan Note (Signed)
Diet and nutrition discussed. Continue atorvastatin 40 mg daily. 

## 2021-02-05 ENCOUNTER — Other Ambulatory Visit: Payer: Self-pay | Admitting: Physical Medicine and Rehabilitation

## 2021-03-16 ENCOUNTER — Institutional Professional Consult (permissible substitution): Payer: BC Managed Care – PPO | Admitting: Neurology

## 2021-03-24 ENCOUNTER — Other Ambulatory Visit: Payer: Self-pay | Admitting: Physical Medicine and Rehabilitation

## 2021-03-30 ENCOUNTER — Telehealth: Payer: Self-pay

## 2021-03-30 NOTE — Telephone Encounter (Signed)
Shannon Garza is wanting to return to work on 8/10/20022. The FMLA ends. Patient has been advised to try to move the follow up appointment up.  Call back phone [phone]. Thank you

## 2021-03-31 NOTE — Telephone Encounter (Signed)
Message left on patient voice identified voice mail.

## 2021-04-01 ENCOUNTER — Other Ambulatory Visit: Payer: Self-pay

## 2021-04-01 ENCOUNTER — Encounter
Payer: BC Managed Care – PPO | Attending: Physical Medicine and Rehabilitation | Admitting: Physical Medicine and Rehabilitation

## 2021-04-01 VITALS — BP 165/83 | HR 62 | Temp 99.1°F | Ht 64.0 in | Wt 253.0 lb

## 2021-04-01 DIAGNOSIS — I639 Cerebral infarction, unspecified: Secondary | ICD-10-CM | POA: Insufficient documentation

## 2021-04-01 DIAGNOSIS — M7502 Adhesive capsulitis of left shoulder: Secondary | ICD-10-CM | POA: Diagnosis not present

## 2021-04-01 DIAGNOSIS — I10 Essential (primary) hypertension: Secondary | ICD-10-CM | POA: Diagnosis not present

## 2021-04-01 NOTE — Progress Notes (Signed)
Subjective:    Patient ID: Shannon Garza, female    DOB: 13-Dec-1965, 55 y.o.   MRN: [id number]  HPI  Shannon Garza presents for post-stroke f/u.   1) Weakness in left side -she feels weaker some times at night -she was concerned regarding whether she was having repeat stroke -she is doing well with walking. -she is still working on Geneticist, molecular.  2) HTN: -BP is 165/63 -she continues to take her blood pressure medication  3) obesity: -she started walking yesterday -she has continued to lose a lot of weight!  4) diabetes: has been 82 when she checked at home.  -she been very careful with her diet.   5) fatigue:  -sister in law concerned about this and recommended that she postpone her return to work.   6) h/o stroke: -followed yo with Dr. Benita Stabile   Pain Inventory Average Pain 8 Pain Right Now 7 My pain is dull and aching  LOCATION OF PAIN  Wrist, hand   BOWEL Number of stools per week: 2 Oral laxative use Yes  Type of laxative herbal laxative OTC Enema or suppository use No  History of colostomy No  Incontinent No   BLADDER Normal In and out cath, frequency n/a Able to self cath No  Bladder incontinence No  Frequent urination No  Leakage with coughing No  Difficulty starting stream No  Incomplete bladder emptying No    Mobility walk without assistance how many minutes can you walk? 15 ability to climb steps?  yes do you drive?  yes Do you have any goals in this area?  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  yes  Neuro/Psych weakness  Prior Studies Any changes since last visit?  no  Physicians involved in your care Neurologist Dr. Pearlean Brownie   No family history on file. Social History   Socioeconomic History   Marital status: Single    Spouse name: Not on file   Number of children: Not on file   Years of  education: Not on file   Highest education level: Not on file  Occupational History   Not on file  Tobacco Use   Smoking status: Never   Smokeless tobacco: Never  Vaping Use   Vaping Use: Never used  Substance and Sexual Activity   Alcohol use: Not Currently   Drug use: Not Currently   Sexual activity: Not Currently  Other Topics Concern   Not on file  Social History Narrative   Lives with sister in law and brother, and sister in own home   Right Handed   Drinks no caffeine   Social Determinants of Health   Financial Resource Strain: Not on file  Food Insecurity: Not on file  Transportation Needs: Not on file  Physical Activity: Not on file  Stress: Not on file  Social Connections: Not on file   No past surgical history on file. Past Medical History:  Diagnosis Date   Diabetes mellitus without complication (HCC)    Hypertension    BP (!) 165/83 (BP Location: Left Arm, Patient Position: Sitting, Cuff Size: Large) Comment (Cuff Size): xl-thigh cuff  Pulse 62   Temp 99.1 F (37.3 C) (Oral)   Ht 5\' 4"  (1.626 m)   Wt 253 lb (114.8 kg)   SpO2 95%   BMI 43.43 kg/m   Opioid Risk Score:   Fall Risk Score:  `  1  Depression screen PHQ 2/9  Depression screen [facility] 2/9 01/17/2021 12/24/2020 12/01/2020  Decreased Interest 0 0 2  Down, Depressed, Hopeless 0 0 0  PHQ - 2 Score 0 0 2  Altered sleeping - - 0  Tired, decreased energy - - 1  Change in appetite - - 0  Feeling bad or failure about yourself  - - 0  Trouble concentrating - - 0  Moving slowly or fidgety/restless - - 1  Suicidal thoughts - - 0  PHQ-9 Score - - 4    Review of Systems     Objective:   Physical Exam Gen: no distress, normal appearing HEENT: oral mucosa pink and moist, NCAT Cardio: Reg rate Chest: normal effort, normal rate of breathing Abd: soft, non-distended Ext: no edema Psych: pleasant, normal affect Skin: intact Neuro: Alert and oriented x3. Musculoskeletal: 4/5 strength in left upper  and lower extremity.     Assessment & Plan:  Shannon Garza is a 55 year old woman who presents for post-stroke follow-up.   1) Subcortical infarction -conitnue HEP -discussed weakness and prognosis -walk 15 minutes per day.  -may restart work on 8/11  2) Prediabetes: -Reviewed HgbA1c today.   3) HTN: -BP is 165/83 today.  -Advised checking BP daily at home and logging results to bring into follow-up appointment with her PCP and myself. -Reviewed BP meds today.  -Advised regarding healthy foods that can help lower blood pressure and provided with a list: 1) citrus foods- high in vitamins and minerals 2) salmon and other fatty fish - reduces inflammation and oxylipins 3) swiss chard (leafy green)- high level of nitrates 4) pumpkin seeds- one of the best natural sources of magnesium 5) Beans and lentils- high in fiber, magnesium, and potassium 6) Berries- high in flavonoids 7) Amaranth (whole grain, can be cooked similarly to rice and oats)- high in magnesium and fiber 8) Pistachios- even more effective at reducing BP than other nuts 9) Carrots- high in phenolic compounds that relax blood vessels and reduce inflammation 10) Celery- contain phthalides that relax tissues of arterial walls 11) Tomatoes- can also improve cholesterol and reduce risk of heart disease 12) Broccoli- good source of magnesium, calcium, and potassium 13) Greek yogurt: high in potassium and calcium 14) Herbs and spices: Celery seed, cilantro, saffron, lemongrass, black cumin, ginseng, cinnamon, cardamom, sweet basil, and ginger 15) Chia and flax seeds- also help to lower cholesterol and blood sugar 16) Beets- high levels of nitrates that relax blood vessels  17) spinach and bananas- high in potassium  -Provided lise of supplements that can help with hypertension:  1) magnesium: one high quality brand is Bioptemizers since it contains all 7 types of magnesium, otherwise over the counter magnesium gluconate 400mg   is a good option 2) B vitamins 3) vitamin D 4) potassium 5) CoQ10 6) L-arginine 7) Vitamin C 8) Beetroot -Educated that goal BP is 120/80. -Made goal to incorporate some of the above foods into diet.    -Provided lise of supplements that can help with hypertension:  1) magnesium: one high quality brand is Bioptemizers since it contains all 7 types of magnesium, otherwise over the counter magnesium gluconate 400mg  is a good option 2) B vitamins 3) vitamin D 4) potassium 5) CoQ10 6) L-arginine 7) Vitamin C 8) Beetroot -Educated that goal BP is 120/80. -Made goal to incorporate some of the above foods into diet.  -Check Vitamin D, potassium, B12 folate,   4) Obesity: Weight is 253- lost 18 lbs!

## 2021-04-01 NOTE — Patient Instructions (Signed)
Obesity: -Consider [place] tea daily.  -Discussed the benefits of intermittent fasting. -Discussed foods that can assist in weight loss: 1) leafy greens- high in fiber and nutrients 2) dark chocolate- improves metabolism (if prefer sweetened, best to sweeten with honey instead of sugar).  3) cruciferous vegetables- high in fiber and protein 4) full fat yogurt: high in healthy fat, protein, calcium, and probiotics 5) apples- high in a variety of phytochemicals 6) nuts- high in fiber and protein that increase feelings of fullness 7) grapefruit: rich in nutrients, antioxidants, and fiber (not to be taken with anticoagulation) 8) beans- high in protein and fiber 9) salmon- has high quality protein and healthy fats 10) green tea- rich in polyphenols 11) eggs- rich in choline and vitamin D 12) tuna- high protein, boosts metabolism 13) avocado- decreases visceral abdominal fat 14) chicken (pasture raised): high in protein and iron 15) blueberries- reduce abdominal fat and cholesterol 16) whole grains- decreases calories retained during digestion, speeds metabolism 17) chia seeds- curb appetite 18) chilies- increases fat metabolism  -Discussed supplements that can be used:  1) Metatrim 400mg  BID 30 minutes before breakfast and dinner  2) Sphaeranthus indicus and Garcinia mangostana (combinations of these and #1 can be found in capsicum and zychrome  3) green coffee bean extract 400mg  twice per day or Irvingia (african mango) 150 to 300mg  twice per day.  Pain: 1) Ginger (especially studied for arthritis)- reduce leukotriene production to decrease inflammation 2) Blueberries- high in phytonutrients that decrease inflammation 3) Salmon- marine omega-3s reduce joint swelling and pain 4) Pumpkin seeds- reduce inflammation 5) dark chocolate- reduces inflammation 6) turmeric- reduces inflammation 7) tart cherries - reduce pain and stiffness 8) extra virgin olive oil - its compound olecanthal  helps to block prostaglandins  9) chili peppers- can be eaten or applied topically via capsaicin 10) mint- helpful for headache, muscle aches, joint pain, and itching 11) garlic- reduces inflammation  Link to further information on diet for chronic pain:   HTN: -Advised regarding healthy foods that can help lower blood pressure and provided with a list: 1) citrus foods- high in vitamins and minerals 2) salmon and other fatty fish - reduces inflammation and oxylipins 3) swiss chard (leafy green)- high level of nitrates 4) pumpkin seeds- one of the best natural sources of magnesium 5) Beans and lentils- high in fiber, magnesium, and potassium 6) Berries- high in flavonoids 7) Amaranth (whole grain, can be cooked similarly to rice and oats)- high in magnesium and fiber 8) Pistachios- even more effective at reducing BP than other nuts 9) Carrots- high in phenolic compounds that relax blood vessels and reduce inflammation 10) Celery- contain phthalides that relax tissues of arterial walls 11) Tomatoes- can also improve cholesterol and reduce risk of heart disease 12) Broccoli- good source of magnesium, calcium, and potassium 13) Greek yogurt: high in potassium and calcium 14) Herbs and spices: Celery seed, cilantro, saffron, lemongrass, black cumin, ginseng, cinnamon, cardamom, sweet basil, and ginger 15) Chia and flax seeds- also help to lower cholesterol and blood sugar 16) Beets- high levels of nitrates that relax blood vessels  17) spinach and bananas- high in potassium  -Provided lise of supplements that can help with hypertension:  1) magnesium: one high quality brand is Bioptemizers since it contains all 7 types of magnesium, otherwise over the counter magnesium gluconate 400mg  is a good option 2) B vitamins 3) vitamin D 4) potassium 5) CoQ10 6) L-arginine 7) Vitamin C 8)  Beetroot -Educated that goal BP  is 120/80. -Made goal to incorporate some of the above foods into diet.

## 2021-04-05 ENCOUNTER — Telehealth: Payer: Self-pay | Admitting: *Deleted

## 2021-04-05 NOTE — Telephone Encounter (Signed)
Notified. 

## 2021-04-05 NOTE — Telephone Encounter (Signed)
Ms Milford called inquiring about whether Dr Carlis Abbott had found her a job here or in the [employer] system?

## 2021-04-07 ENCOUNTER — Other Ambulatory Visit: Payer: Self-pay | Admitting: Physical Medicine and Rehabilitation

## 2021-04-08 ENCOUNTER — Other Ambulatory Visit: Payer: Self-pay | Admitting: Physical Medicine and Rehabilitation

## 2021-04-08 ENCOUNTER — Other Ambulatory Visit (HOSPITAL_COMMUNITY): Payer: Self-pay

## 2021-04-08 ENCOUNTER — Telehealth: Payer: Self-pay

## 2021-04-08 ENCOUNTER — Other Ambulatory Visit: Payer: Self-pay | Admitting: Emergency Medicine

## 2021-04-08 DIAGNOSIS — I10 Essential (primary) hypertension: Secondary | ICD-10-CM

## 2021-04-08 MED ORDER — LISINOPRIL 40 MG PO TABS
40.0000 mg | ORAL_TABLET | Freq: Every day | ORAL | 3 refills | Status: DC
  Filled 2021-04-08: qty 30, 30d supply, fill #0

## 2021-04-08 NOTE — Telephone Encounter (Signed)
New prescription for Lisinopril sent to pharmacy of record. Thanks.

## 2021-04-08 NOTE — Telephone Encounter (Signed)
pt has stated she started taking losartan-hydrochlorothiazide (HYZAAR) 50-12.5 MG tablet in replacement Lisinopril and pt states she has been having b/l swelling to her feet a/w dizziness. Pt states she does not want to take the losartan-hydrochlorothiazide (HYZAAR) 50-12.5 MG tablet any longer and would like for you to please rx the Lisinopril as she wants to start back on this ASAP!

## 2021-04-11 ENCOUNTER — Other Ambulatory Visit: Payer: Self-pay | Admitting: Physical Medicine and Rehabilitation

## 2021-04-11 DIAGNOSIS — I10 Essential (primary) hypertension: Secondary | ICD-10-CM

## 2021-04-13 ENCOUNTER — Other Ambulatory Visit: Payer: Self-pay | Admitting: Physical Medicine and Rehabilitation

## 2021-04-13 DIAGNOSIS — I10 Essential (primary) hypertension: Secondary | ICD-10-CM

## 2021-04-14 ENCOUNTER — Other Ambulatory Visit: Payer: Self-pay | Admitting: Emergency Medicine

## 2021-04-14 DIAGNOSIS — I10 Essential (primary) hypertension: Secondary | ICD-10-CM

## 2021-04-15 ENCOUNTER — Ambulatory Visit: Payer: BC Managed Care – PPO | Admitting: Physical Therapy

## 2021-04-18 ENCOUNTER — Ambulatory Visit: Payer: BC Managed Care – PPO | Admitting: Physical Medicine and Rehabilitation

## 2021-04-19 ENCOUNTER — Other Ambulatory Visit: Payer: Self-pay

## 2021-04-19 ENCOUNTER — Ambulatory Visit: Payer: BC Managed Care – PPO | Attending: Physical Medicine and Rehabilitation

## 2021-04-19 ENCOUNTER — Encounter: Payer: Self-pay | Admitting: Emergency Medicine

## 2021-04-19 ENCOUNTER — Ambulatory Visit (INDEPENDENT_AMBULATORY_CARE_PROVIDER_SITE_OTHER): Payer: BC Managed Care – PPO | Admitting: Emergency Medicine

## 2021-04-19 VITALS — BP 130/70 | HR 68 | Temp 98.3°F | Ht 64.0 in | Wt 251.0 lb

## 2021-04-19 DIAGNOSIS — Z8673 Personal history of transient ischemic attack (TIA), and cerebral infarction without residual deficits: Secondary | ICD-10-CM | POA: Diagnosis not present

## 2021-04-19 DIAGNOSIS — E785 Hyperlipidemia, unspecified: Secondary | ICD-10-CM

## 2021-04-19 DIAGNOSIS — M25512 Pain in left shoulder: Secondary | ICD-10-CM | POA: Diagnosis not present

## 2021-04-19 DIAGNOSIS — M25612 Stiffness of left shoulder, not elsewhere classified: Secondary | ICD-10-CM | POA: Diagnosis not present

## 2021-04-19 DIAGNOSIS — I10 Essential (primary) hypertension: Secondary | ICD-10-CM

## 2021-04-19 DIAGNOSIS — G8929 Other chronic pain: Secondary | ICD-10-CM | POA: Diagnosis not present

## 2021-04-19 MED ORDER — ASPIRIN EC 81 MG PO TBEC: 81.0000 mg | DELAYED_RELEASE_TABLET | Freq: Every day | ORAL | 11 refills | Status: DC

## 2021-04-19 MED ORDER — LISINOPRIL 40 MG PO TABS: 40.0000 mg | ORAL_TABLET | Freq: Every day | ORAL | 3 refills | Status: DC

## 2021-04-19 NOTE — Patient Instructions (Signed)

## 2021-04-19 NOTE — Progress Notes (Signed)
Shannon Garza 55 y.o.   Chief Complaint  Patient presents with   Hypertension    Follow up. Pt is no longer taking losartan for BP due to her feet swelling.    HISTORY OF PRESENT ILLNESS: This is a 55 y.o. female with history of hypertension and 2 strokes in the past here for follow-up and medication refill. Presently on lisinopril 40 mg daily, atorvastatin 40 mg daily and baby aspirin daily. Doing well.  Has no complaints or medical concerns today.  Hypertension Pertinent negatives include no chest pain, headaches, palpitations or shortness of breath.    Prior to Admission medications   Medication Sig Start Date End Date Taking? Authorizing Provider  aspirin 81 MG chewable tablet Chew 1 tablet (81 mg total) by mouth daily. 10/19/20  Yes Westley ChandlerBrown, Carina M, MD  atorvastatin (LIPITOR) 40 MG tablet Take 1 tablet (40 mg total) by mouth at bedtime. 01/12/21  Yes Raulkar, Drema PryKrutika P, MD  b complex vitamins capsule Take 1 capsule by mouth daily. 01/12/21  Yes Raulkar, Drema PryKrutika P, MD  lisinopril (ZESTRIL) 40 MG tablet TAKE 1 TABLET BY MOUTH EVERY DAY 04/14/21  Yes Melodee Lupe, Eilleen KempfMiguel Jose, MD  Turmeric (QC TUMERIC COMPLEX PO) Take 1 tablet by mouth daily.   Yes [provider]  Vitamin D, Ergocalciferol, (DRISDOL) 1.25 MG (50000 UNIT) CAPS capsule Take 1 capsule (50,000 Units total) by mouth every 7 (seven) days. 01/13/21  Yes Raulkar, Drema PryKrutika P, MD  acetaminophen (TYLENOL) 325 MG tablet Take 2 tablets (650 mg total) by mouth every 4 (four) hours as needed for mild pain (or temp > 37.5 C (99.5 F)). Patient not taking: No sig reported 10/18/20   Westley ChandlerBrown, Carina M, MD    No Known Allergies  Patient Active Problem List   Diagnosis Date Noted   History of stroke 01/17/2021   Dyslipidemia 01/17/2021   CVA (cerebral vascular accident) (HCC) 10/18/2020   Primary hypertension    Left arm weakness    Left leg weakness    Subcortical infarction (HCC) 10/12/2020    Past Medical History:   Diagnosis Date   Diabetes mellitus without complication (HCC)    Hypertension     History reviewed. No pertinent surgical history.  Social History   Socioeconomic History   Marital status: Single    Spouse name: Not on file   Number of children: Not on file   Years of education: Not on file   Highest education level: Not on file  Occupational History   Not on file  Tobacco Use   Smoking status: Never   Smokeless tobacco: Never  Vaping Use   Vaping Use: Never used  Substance and Sexual Activity   Alcohol use: Not Currently   Drug use: Not Currently   Sexual activity: Not Currently  Other Topics Concern   Not on file  Social History Narrative   Lives with sister in law and brother, and sister in own home   Right Handed   Drinks no caffeine   Social Determinants of Health   Financial Resource Strain: Not on file  Food Insecurity: Not on file  Transportation Needs: Not on file  Physical Activity: Not on file  Stress: Not on file  Social Connections: Not on file  Intimate Partner Violence: Not on file    History reviewed. No pertinent family history.   Review of Systems  Constitutional: Negative.  Negative for chills and fever.  HENT: Negative.  Negative for congestion and sore throat.   Respiratory: Negative.  Negative for cough and shortness of breath.   Cardiovascular: Negative.  Negative for chest pain and palpitations.  Gastrointestinal:  Negative for abdominal pain, blood in stool, diarrhea, nausea and vomiting.  Genitourinary: Negative.  Negative for dysuria and hematuria.  Musculoskeletal: Negative.   Skin: Negative.  Negative for rash.  Neurological:  Negative for dizziness and headaches.  All other systems reviewed and are negative.   Physical Exam Vitals reviewed.  Constitutional:      Appearance: Normal appearance.  HENT:     Head: Normocephalic.  Eyes:     Extraocular Movements: Extraocular movements intact.     Conjunctiva/sclera:  Conjunctivae normal.     Pupils: Pupils are equal, round, and reactive to light.  Cardiovascular:     Rate and Rhythm: Normal rate and regular rhythm.     Pulses: Normal pulses.     Heart sounds: Normal heart sounds.  Pulmonary:     Effort: Pulmonary effort is normal.     Breath sounds: Normal breath sounds.  Musculoskeletal:        General: Normal range of motion.     Cervical back: Normal range of motion and neck supple.  Skin:    General: Skin is warm and dry.     Capillary Refill: Capillary refill takes less than 2 seconds.  Neurological:     General: No focal deficit present.     Mental Status: She is alert and oriented to person, place, and time.  Psychiatric:        Mood and Affect: Mood normal.        Behavior: Behavior normal.     ASSESSMENT & PLAN: Shannon Garza was seen today for hypertension.  Diagnoses and all orders for this visit:  Primary hypertension -     lisinopril (ZESTRIL) 40 MG tablet; Take 1 tablet (40 mg total) by mouth daily.  History of stroke -     aspirin EC 81 MG tablet; Take 1 tablet (81 mg total) by mouth daily. Swallow whole.  Dyslipidemia    Primary hypertension Well-controlled hypertension.  Continue lisinopril 40 mg daily Advised to continue monitoring blood pressure readings at home and keep a log to show me at a later visit.   History of stroke Making progress.  Continue baby aspirin daily. Secondary prevention is the main goal. Diet and nutrition discussed.  Importance of controlling hypertension and dyslipidemia discussed.   Dyslipidemia Diet and nutrition discussed.  Patient is vegetarian.  Continue atorvastatin 40 mg daily.  Patient Instructions  Hypertension, Adult High blood pressure (hypertension) is when the force of blood pumping through the arteries is too strong. The arteries are the blood vessels that carry blood from the heart throughout the body. Hypertension forces the heart to work harder to pump blood and may cause  arteries to become narrow or stiff. Untreated or uncontrolled hypertension can cause a heart attack, heart failure, a stroke, kidney disease, and otherproblems. A blood pressure reading consists of a higher number over a lower number. Ideally, your blood pressure should be below 120/80. The first ("top") number is called the systolic pressure. It is a measure of the pressure in your arteries as your heart beats. The second ("bottom") number is called the diastolic pressure. It is a measure of the pressure in your arteries as theheart relaxes. What are the causes? The exact cause of this condition is not known. There are some conditions thatresult in or are related to high blood pressure. What increases the risk? Some risk factors  for high blood pressure are under your control. The following factors may make you more likely to develop this condition: Smoking. Having type 2 diabetes mellitus, high cholesterol, or both. Not getting enough exercise or physical activity. Being overweight. Having too much fat, sugar, calories, or salt (sodium) in your diet. Drinking too much alcohol. Some risk factors for high blood pressure may be difficult or impossible to change. Some of these factors include: Having chronic kidney disease. Having a family history of high blood pressure. Age. Risk increases with age. Race. You may be at higher risk if you are African American. Gender. Men are at higher risk than women before age 51. After age 58, women are at higher risk than men. Having obstructive sleep apnea. Stress. What are the signs or symptoms? High blood pressure may not cause symptoms. Very high blood pressure (hypertensive crisis) may cause: Headache. Anxiety. Shortness of breath. Nosebleed. Nausea and vomiting. Vision changes. Severe chest pain. Seizures. How is this diagnosed? This condition is diagnosed by measuring your blood pressure while you are seated, with your arm resting on a flat  surface, your legs uncrossed, and your feet flat on the floor. The cuff of the blood pressure monitor will be placed directly against the skin of your upper arm at the level of your heart. It should be measured at least twice using the same arm. Certain conditions cancause a difference in blood pressure between your right and left arms. Certain factors can cause blood pressure readings to be lower or higher than normal for a short period of time: When your blood pressure is higher when you are in a health care provider's office than when you are at home, this is called white coat hypertension. Most people with this condition do not need medicines. When your blood pressure is higher at home than when you are in a health care provider's office, this is called masked hypertension. Most people with this condition may need medicines to control blood pressure. If you have a high blood pressure reading during one visit or you have normal blood pressure with other risk factors, you may be asked to: Return on a different day to have your blood pressure checked again. Monitor your blood pressure at home for 1 week or longer. If you are diagnosed with hypertension, you may have other blood or imaging tests to help your health care provider understand your overall risk for otherconditions. How is this treated? This condition is treated by making healthy lifestyle changes, such as eating healthy foods, exercising more, and reducing your alcohol intake. Your health care provider may prescribe medicine if lifestyle changes are not enough to get your blood pressure under control, and if: Your systolic blood pressure is above 130. Your diastolic blood pressure is above 80. Your personal target blood pressure may vary depending on your medicalconditions, your age, and other factors. Follow these instructions at home: Eating and drinking  Eat a diet that is high in fiber and potassium, and low in sodium, added sugar,  and fat. An example eating plan is called the DASH (Dietary Approaches to Stop Hypertension) diet. To eat this way: Eat plenty of fresh fruits and vegetables. Try to fill one half of your plate at each meal with fruits and vegetables. Eat whole grains, such as whole-wheat pasta, brown rice, or whole-grain bread. Fill about one fourth of your plate with whole grains. Eat or drink low-fat dairy products, such as skim milk or low-fat yogurt. Avoid fatty cuts of  meat, processed or cured meats, and poultry with skin. Fill about one fourth of your plate with lean proteins, such as fish, chicken without skin, beans, eggs, or tofu. Avoid pre-made and processed foods. These tend to be higher in sodium, added sugar, and fat. Reduce your daily sodium intake. Most people with hypertension should eat less than 1,500 mg of sodium a day. Do not drink alcohol if: Your health care provider tells you not to drink. You are pregnant, may be pregnant, or are planning to become pregnant. If you drink alcohol: Limit how much you use to: 0-1 drink a day for women. 0-2 drinks a day for men. Be aware of how much alcohol is in your drink. In the [place] one drink equals one 12 oz bottle of beer (355 mL), one 5 oz glass of wine (148 mL), or one 1 oz glass of hard liquor (44 mL).  Lifestyle  Work with your health care provider to maintain a healthy body weight or to lose weight. Ask what an ideal weight is for you. Get at least 30 minutes of exercise most days of the week. Activities may include walking, swimming, or biking. Include exercise to strengthen your muscles (resistance exercise), such as Pilates or lifting weights, as part of your weekly exercise routine. Try to do these types of exercises for 30 minutes at least 3 days a week. Do not use any products that contain nicotine or tobacco, such as cigarettes, e-cigarettes, and chewing tobacco. If you need help quitting, ask your health care provider. Monitor your  blood pressure at home as told by your health care provider. Keep all follow-up visits as told by your health care provider. This is important.  Medicines Take over-the-counter and prescription medicines only as told by your health care provider. Follow directions carefully. Blood pressure medicines must be taken as prescribed. Do not skip doses of blood pressure medicine. Doing this puts you at risk for problems and can make the medicine less effective. Ask your health care provider about side effects or reactions to medicines that you should watch for. Contact a health care provider if you: Think you are having a reaction to a medicine you are taking. Have headaches that keep coming back (recurring). Feel dizzy. Have swelling in your ankles. Have trouble with your vision. Get help right away if you: Develop a severe headache or confusion. Have unusual weakness or numbness. Feel faint. Have severe pain in your chest or abdomen. Vomit repeatedly. Have trouble breathing. Summary Hypertension is when the force of blood pumping through your arteries is too strong. If this condition is not controlled, it may put you at risk for serious complications. Your personal target blood pressure may vary depending on your medical conditions, your age, and other factors. For most people, a normal blood pressure is less than 120/80. Hypertension is treated with lifestyle changes, medicines, or a combination of both. Lifestyle changes include losing weight, eating a healthy, low-sodium diet, exercising more, and limiting alcohol. This information is not intended to replace advice given to you by your health care provider. Make sure you discuss any questions you have with your healthcare provider. Document Revised: 04/24/2018 Document Reviewed: 04/24/2018 Elsevier Patient Education  2022 [employer]  Edwina Barth, MD [facility]

## 2021-04-20 NOTE — Therapy (Signed)
[facility] [address] [place], [place], [address] Phone: [phone]   Fax:  [phone]  Physical Therapy Evaluation  Patient Details  Name: Shannon Garza MRN: [id number] Date of Birth: 1966/02/05 Referring Provider (PT): Carlis Abbott Drema Pry, MD   Encounter Date: 04/19/2021   PT End of Session - 04/20/21 1831     Visit Number 1    Number of Visits 9    Date for PT Re-Evaluation 06/15/21    Authorization Type BCBS    Progress Note Due on Visit 10    PT Start Time 1745    PT Stop Time 1825    PT Time Calculation (min) 40 min    Activity Tolerance Patient tolerated treatment well    Behavior During Therapy [facility] for tasks assessed/performed             Past Medical History:  Diagnosis Date   Diabetes mellitus without complication (HCC)    Hypertension     History reviewed. No pertinent surgical history.  There were no vitals filed for this visit.    Subjective Assessment - 04/19/21 1746     Subjective Pt presents to PT with reports of chronic L shoulder pain and discomfort d/t L hemiplegia. Pt has PMH significant for CVA affecting both R and L extremities. Pt is RHD and notes that her L side has been more affected by more recent CVA. She was seeing neuro-rehab PT and OT for several months earlier this year. She promotes some occasional n/t in L hand, but denies change in sensation overall down L UE. She also notes some referral of pain into L bicep and is generallly frustrated by pain and inability to fix her hair comfortably.    Patient Stated Goals Pt would like to decrease L shoulder pain in order to get back to fixing her hair and reaching into cabinets wiht improvement function    Currently in Pain? Yes    Pain Score 5    with movement   Pain Location Shoulder    Pain Orientation Left    Pain Descriptors / Indicators Sharp    Pain Type Chronic pain    Pain Onset More than a month ago    Pain Frequency  Intermittent    Aggravating Factors  elevation, laying on L side    Pain Relieving Factors rest                [facility] PT Assessment - 04/20/21 0001       Assessment   Medical Diagnosis M75.02 (ICD-10-CM) - Adhesive capsulitis of left shoulder    Referring Provider (PT) Raulkar, Drema Pry, MD    Hand Dominance Right      Precautions   Precautions Fall      Restrictions   Weight Bearing Restrictions No      Balance Screen   Has the patient fallen in the past 6 months Yes    How many times? once; fell while changing in dressing room    Has the patient had a decrease in activity level because of a fear of falling?  No    Is the patient reluctant to leave their home because of a fear of falling?  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   Vocation Requirements computer work, work from home      Cognition   Overall Cognitive Status Within Functional Limits for tasks assessed    Attention Focused      Observation/Other Assessments   Focus on Therapeutic Outcomes (FOTO)  FOTO was not set up    Other Surveys  Quick Dash    Quick DASH  20% disability      Sensation   Light Touch Impaired by gross assessment    Additional Comments Numbness (pins and needles) Left hand      AROM   Overall AROM Comments R UE WFL    Left Shoulder Flexion 108 Degrees    Left Shoulder ABduction 75 Degrees     Left Shoulder Internal Rotation --   L PSIS   Left Shoulder External Rotation 40 Degrees      Palpation   Palpation comment TTP to L subscap; L biceps tendon proximally; decreased scapular movement during abd L                        Objective measurements completed on examination: See above findings.               PT Education - 04/20/21 1839     Education Details eval findings, HEP, POC    Person(s) Educated Patient    Methods Explanation;Demonstration;Handout    Comprehension Verbalized understanding;Returned demonstration              PT Short Term Goals - 04/20/21 1832       PT SHORT TERM GOAL #1   Title Pt will be compliant with 90% of HEP in order to improve comfort and functional ability    Baseline initial HEP given    Time 3    Period Weeks    Status New    Target Date 05/11/21               PT Long Term Goals - 04/20/21 1833       PT LONG TERM GOAL #1   Title Pt will decrease Quick DASH score to no greater than 5% as proxy for functional improvement    Baseline 20% disability    Time 8    Period Weeks    Status New    Target Date 06/15/21      PT LONG TERM GOAL #2   Title Pt will self report L shoulder pain no greater than 3/10 at worst in order to improve comfort and function    Baseline 5/10 at worst    Time 8    Period Weeks    Status New    Target Date 06/15/21      PT LONG TERM GOAL #3   Title Pt will increase L shoulder flex/abd to no less than 140 degrees in order to improve functional ability    Baseline see flowsheet    Time 8    Period Weeks    Status New    Target Date  06/15/21                    Plan - 04/20/21 1842     Clinical Impression Statement Pt is a pleasant 55 y/o F who presents to PT with reports of chronic L shoulder pain after CVA earlier this year. Physical findings are consistent with MD impression and pt's subjective complaints, as she demonstrates decreaed L shoulder  ROM, strength, and pain during elevation. Her Quick DASH score demonstrates min disability and shows she is operating below her PLOF. Due to presentation of motor impairments in L scapula, it appears likely that her pain and deficits are related to hemiplegia on L s/p CVA. She would benefit from skilled PT services working on improving periscapular strength and biomechanics in order to decrease pain and improve function.    Personal Factors and Comorbidities Comorbidity 3+;Fitness;Past/Current Experience;Transportation    Comorbidities Hx of CVA in 2017 and 2022, DM, HTN    Examination-Activity Limitations Bed Mobility;Carry;Dressing;Stand;Stairs;Locomotion Level;Transfers    Examination-Participation Restrictions Cleaning;Driving;Laundry;Occupation    Stability/Clinical Decision Making Stable/Uncomplicated    Clinical Decision Making Low    Rehab Potential Good    PT Frequency 1x / week    PT Duration 8 weeks    PT Treatment/Interventions ADLs/Self Care Home Management;Biofeedback;Gait training;Stair training;Functional mobility training;Therapeutic activities;Therapeutic exercise;Balance training;Neuromuscular re-education;Orthotic Fit/Training;Patient/family education;Moist Heat;Cryotherapy;Electrical Stimulation;Manual techniques;Taping;Vasopneumatic Device    PT Next Visit Plan assess response to HEP; progress as able    PT Home Exercise Plan Access Code: [email or web address]    Consulted and Agree with Plan of Care Patient             Patient will benefit from skilled therapeutic intervention in order to improve the following deficits and impairments:  Abnormal gait, Decreased strength, Impaired sensation, Postural dysfunction, Decreased balance, Decreased knowledge of use of DME, Impaired flexibility, Decreased endurance, Decreased range of motion, Impaired UE functional use, Pain  Visit Diagnosis: Stiffness of left shoulder, not elsewhere classified  Chronic left shoulder pain     Problem  List Patient Active Problem List   Diagnosis Date Noted   History of stroke 01/17/2021   Dyslipidemia 01/17/2021   CVA (cerebral vascular accident) (HCC) 10/18/2020   Primary hypertension    Left arm weakness    Left leg weakness    Subcortical infarction (HCC) 10/12/2020    Eloy End, PT, DPT 04/20/21 6:48 PM  [facility] [address] [place], [place], [address] Phone: [phone]   Fax:  [phone]  Name: Elsi Stelzer MRN: [id number] Date of Birth: 07-Nov-1965

## 2021-04-21 ENCOUNTER — Ambulatory Visit: Payer: BC Managed Care – PPO | Admitting: Physical Medicine and Rehabilitation

## 2021-04-27 ENCOUNTER — Ambulatory Visit: Payer: BC Managed Care – PPO

## 2021-05-03 ENCOUNTER — Ambulatory Visit (INDEPENDENT_AMBULATORY_CARE_PROVIDER_SITE_OTHER): Payer: BC Managed Care – PPO | Admitting: Neurology

## 2021-05-03 ENCOUNTER — Encounter: Payer: Self-pay | Admitting: Neurology

## 2021-05-03 VITALS — BP 185/78 | HR 80 | Ht 64.0 in | Wt 256.6 lb

## 2021-05-03 DIAGNOSIS — R011 Cardiac murmur, unspecified: Secondary | ICD-10-CM

## 2021-05-03 DIAGNOSIS — Z8673 Personal history of transient ischemic attack (TIA), and cerebral infarction without residual deficits: Secondary | ICD-10-CM

## 2021-05-03 DIAGNOSIS — I6381 Other cerebral infarction due to occlusion or stenosis of small artery: Secondary | ICD-10-CM | POA: Diagnosis not present

## 2021-05-03 DIAGNOSIS — R030 Elevated blood-pressure reading, without diagnosis of hypertension: Secondary | ICD-10-CM | POA: Diagnosis not present

## 2021-05-03 DIAGNOSIS — R0681 Apnea, not elsewhere classified: Secondary | ICD-10-CM

## 2021-05-03 DIAGNOSIS — R351 Nocturia: Secondary | ICD-10-CM

## 2021-05-03 NOTE — Patient Instructions (Signed)
It was nice to meet you today.   Please contact your primary care physician as you do have a systolic heart murmur and may benefit from seeing a cardiologist.  Your blood pressure was elevated today.  Please also talk to primary care about ongoing blood pressure management.  Please get back on your baby aspirin 81 mg daily.  Based on your symptoms and your exam I believe you are at risk for obstructive sleep apnea (aka OSA), and I think we should proceed with a sleep study to determine whether you do or do not have OSA and how severe it is. Even, if you have mild OSA, I may want you to consider treatment with CPAP, as treatment of even borderline or mild sleep apnea can result and improvement of symptoms such as sleep disruption, daytime sleepiness, nighttime bathroom breaks, restless leg symptoms, improvement of headache syndromes, even improved mood disorder.   As explained, an attended sleep study meaning you get to stay overnight in the sleep lab, lets [place] monitor sleep-related behaviors such as sleep talking and leg movements in sleep, in addition to monitoring for sleep apnea.  A home sleep test is a screening tool for sleep apnea only, and unfortunately does not help with any other sleep-related diagnoses.  Please remember, the long-term risks and ramifications of untreated moderate to severe obstructive sleep apnea are: increased Cardiovascular disease, including congestive heart failure, stroke, difficult to control hypertension, treatment resistant obesity, arrhythmias, especially irregular heartbeat commonly known as A. Fib. (atrial fibrillation); even type 2 diabetes has been linked to untreated OSA.   Sleep apnea can cause disruption of sleep and sleep deprivation in most cases, which, in turn, can cause recurrent headaches, problems with memory, mood, concentration, focus, and vigilance. Most people with untreated sleep apnea report excessive daytime sleepiness, which can affect their ability  to drive. Please do not drive if you feel sleepy. Patients with sleep apnea can also develop difficulty initiating and maintaining sleep (aka insomnia).   Having sleep apnea may increase your risk for other sleep disorders, including involuntary behaviors sleep such as sleep terrors, sleep talking, sleepwalking.    Having sleep apnea can also increase your risk for restless leg syndrome and leg movements at night.   Please note that untreated obstructive sleep apnea may carry additional perioperative morbidity. Patients with significant obstructive sleep apnea (typically, in the moderate to severe degree) should receive, if possible, perioperative PAP (positive airway pressure) therapy and the surgeons and particularly the anesthesiologists should be informed of the diagnosis and the severity of the sleep disordered breathing.   I will likely see you back after your sleep study to go over the test results and where to go from there. We will call you after your sleep study to advise about the results (most likely, you will hear from [facility], my nurse) and to set up an appointment at the time, as necessary.    Our sleep lab administrative assistant will call you to schedule your sleep study and give you further instructions, regarding the check in process for the sleep study, arrival time, what to bring, when you can expect to leave after the study, etc., and to answer any other logistical questions you may have. If you don't hear back from her by about 2 weeks from now, please feel free to call her direct line at [phone] or you can call our general clinic number, or email [place] through My Chart.

## 2021-05-03 NOTE — Progress Notes (Signed)
Subjective:    Patient ID: Shannon Garza is a 55 y.o. female.  HPI    Huston Foley, MD, PhD [facility] [address] [address] [place], [place] [address]  Dear Shannon Garza,   I saw your patient, [name], upon your kind request in my sleep clinic today for initial consultation of her sleep disorder, in particular, concern for underlying obstructive sleep apnea.  The patient is unaccompanied today.  As you know, Ms. Kosanke is a 55 year old right-handed woman with an underlying medical history of hypertension, diabetes, stroke in 2017 (when she was in [place]), lacunar stroke in February 2022, hyperlipidemia, and severe obesity with a BMI of over 40, who reports witnessed breathing pauses and daytime tiredness.  She reports residual weakness in the left hemibody and some pain in the left upper and left lower extremities.  She is currently in physical therapy.  I reviewed your office note from 01/12/2021.  Her Epworth sleepiness score is 8 out of 24, fatigue severity score is 12 out of 63. Her sister has witnessed pauses in her breathing while asleep, she is not sure if she snores.  She has no family history of sleep apnea.  Weight has been fluctuating.  Her highest weight in the past was over 300 pounds.  She is working on weight loss.  She tries to hydrate well with water.  She does not drink caffeine daily, she does not drink any alcohol, she is a non-smoker.  Of note, she stopped taking her baby aspirin about 2 months ago.  She reports that she is taking garlic and turmeric instead.  She is encouraged to get back on her baby aspirin.  Her blood pressure is elevated today but she reports being consistent with her lisinopril.  She goes to bed around 8 PM and rise time is between 7 and 8 AM.  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  She has nocturia about twice per average night and denies recurrent morning headaches.  She lives with her  brother and sister-in-law, no kids in the household, she has no biological children, and no pets in the household, no TV in her bedroom.    Her Past Medical History Is Significant For: Past Medical History:  Diagnosis Date   Diabetes mellitus without complication (HCC)    Hypertension     Her Past Surgical History Is Significant For: History reviewed. No pertinent surgical history.  Her Family History Is Significant For: Family History  Problem Relation Age of Onset   Sleep apnea Neg Hx     Her Social History Is Significant For: Social History   Socioeconomic History   Marital status: Single    Spouse name: Not on file   Number of children: Not on file   Years of education: Not on file   Highest education level: Not on file  Occupational History   Not on file  Tobacco Use   Smoking status: Never   Smokeless tobacco: Never  Vaping Use   Vaping Use: Never used  Substance and Sexual Activity   Alcohol use: Not Currently   Drug use: Not Currently   Sexual activity: Not Currently  Other Topics Concern   Not on file  Social History Narrative   Lives with sister in law and brother, and sister in own home   Right Handed   Drinks no caffeine   Social Determinants of Health   Financial Resource Strain: Not on file  Food  Insecurity: Not on file  Transportation Needs: Not on file  Physical Activity: Not on file  Stress: Not on file  Social Connections: Not on file    Her Allergies Are:  No Known Allergies:   Her Current Medications Are:  Outpatient Encounter Medications as of 05/03/2021  Medication Sig   aspirin EC 81 MG tablet Take 1 tablet (81 mg total) by mouth daily. Swallow whole.   atorvastatin (LIPITOR) 40 MG tablet Take 1 tablet (40 mg total) by mouth at bedtime.   b complex vitamins capsule Take 1 capsule by mouth daily.   lisinopril (ZESTRIL) 40 MG tablet Take 1 tablet (40 mg total) by mouth daily.   Turmeric (QC TUMERIC COMPLEX PO) Take 1 tablet by mouth  daily.   Vitamin D, Ergocalciferol, (DRISDOL) 1.25 MG (50000 UNIT) CAPS capsule Take 1 capsule (50,000 Units total) by mouth every 7 (seven) days.   acetaminophen (TYLENOL) 325 MG tablet Take 2 tablets (650 mg total) by mouth every 4 (four) hours as needed for mild pain (or temp > 37.5 C (99.5 F)). (Patient not taking: No sig reported)   No facility-administered encounter medications on file as of 05/03/2021.  :   Review of Systems:  Out of a complete 14 point review of systems, all are reviewed and negative with the exception of these symptoms as listed below:  Review of Systems  Neurological:        Pt PCP wanted her to have sleep consult. Pt denies snoring , fatigue , and headaches. Pt does state she has hypertension. .   FSS:12 ESS :8   Objective:  Neurological Exam  Physical Exam Physical Examination:   Vitals:   05/03/21 0858  BP: (!) 185/78  Pulse: 80    General Examination: The patient is a very pleasant 55 y.o. female in no acute distress. She appears well-developed and well-nourished and well groomed.   HEENT: Normocephalic, atraumatic, pupils are equal, round and reactive to light, extraocular tracking is good without limitation to gaze excursion or nystagmus noted. Hearing is grossly intact. Face is slightly asymmetric with mild left facial weakness noted.  Speech is slow but without dysarthria.   There is no lip, neck/head, jaw or voice tremor. Neck is supple with full range of passive and active motion. There are no carotid bruits on auscultation. Oropharynx exam reveals: Mild mouth dryness, adequate dental hygiene and moderate airway crowding, due to larger tongue, prominent uvula, tonsils of 1-2+ bilaterally.  Mallampati class III.  Neck circumference of 15 3/8 inches.  Tongue protrudes centrally and palate elevates symmetrically.  She has a minimal overbite.  Chest: Clear to auscultation without wheezing, rhonchi or crackles noted.  Heart: S1+S2+0, regular with a  mild systolic murmur noted.    Abdomen: Soft, non-tender and non-distended with normal bowel sounds appreciated on auscultation.  Extremities: There is trace pitting edema in the distal lower extremities bilaterally.   Skin: Warm and dry without trophic changes noted.   Musculoskeletal: exam reveals no obvious joint deformities, tenderness or joint swelling or erythema.   Neurologically:  Mental status: The patient is awake, alert and oriented in all 4 spheres. Her immediate and remote memory, attention, language skills and fund of knowledge are appropriate. There is no evidence of aphasia, agnosia, apraxia or anomia. Speech is clear with normal prosody and enunciation. Thought process is linear. Mood is normal and affect is normal.  Cranial nerves II - XII are as described above under HEENT exam.  Motor exam: Normal  bulk, mild weakness in the left upper and lower extremity, no obvious limping, no tremor noted.  Fine motor skills and coordination: grossly intact.  Cerebellar testing: No dysmetria or intention tremor. There is no truncal or gait ataxia.  Sensory exam: intact to light touch in the upper and lower extremities.  Gait, station and balance: She stands slowly, she walks slightly slowly and cautiously, no obvious limp, no shuffling, has preserved arm swing.   Assessment and Plan:  In summary, Reneta Niehaus is a very pleasant 55 y.o.-year old female with an underlying medical history of hypertension, diabetes, stroke in 2017 (when she was in [place]), lacunar stroke in February 2022, hyperlipidemia, and severe obesity with a BMI of over 40, whose history and physical exam are concerning for obstructive sleep apnea (OSA). I had a long chat with the patient about my findings and the diagnosis of OSA, its prognosis and treatment options. We talked about medical treatments, surgical interventions and non-pharmacological approaches. I explained in particular the risks and ramifications of  untreated moderate to severe OSA, especially with respect to developing cardiovascular disease down the Road, including congestive heart failure, difficult to treat hypertension, cardiac arrhythmias, or stroke. Even type 2 diabetes has, in part, been linked to untreated OSA. Symptoms of untreated OSA include daytime sleepiness, memory problems, mood irritability and mood disorder such as depression and anxiety, lack of energy, as well as recurrent headaches, especially morning headaches. We talked about trying to maintain a healthy lifestyle in general, as well as the importance of weight control. We also talked about the importance of good sleep hygiene.  She is advised to get back on her baby aspirin once daily.  She is advised to talk to her primary care physician about evaluation of her systolic murmur and blood pressure management. I recommended the following at this time: sleep study.  I outlined the difference between a laboratory attended sleep study versus home sleep test. I explained possible surgical and non-surgical treatment options of OSA, including the use of a custom-made dental device (which would require a referral to a specialist dentist or oral surgeon), upper airway surgical options, such as traditional UPPP or a novel less invasive surgical option in the form of Inspire hypoglossal nerve stimulation (which would involve a referral to an ENT surgeon). I also explained the CPAP treatment option to the patient, who indicated that she would be willing to try CPAP if the need arises.  We will pick up our discussion after testing.  We will keep her posted as to her test results by phone call and plan a follow-up accordingly.  I answered all her questions today and she was in agreement.    Thank you very much for allowing me to participate in the care of this nice patient. If I can be of any further assistance to you please do not hesitate to talk to me,  Sincerely,   Huston Foley, MD, PhD

## 2021-05-04 ENCOUNTER — Ambulatory Visit: Payer: BC Managed Care – PPO | Admitting: Physical Therapy

## 2021-05-04 MED ORDER — VITAMIN D (ERGOCALCIFEROL) 1.25 MG (50000 UNIT) PO CAPS: 50000.0000 [IU] | ORAL_CAPSULE | ORAL | 0 refills | Status: AC

## 2021-05-04 NOTE — Addendum Note (Signed)
Addended by: Horton Chin on: 05/04/2021 10:48 AM   Modules accepted: Orders

## 2021-05-05 ENCOUNTER — Ambulatory Visit: Payer: BC Managed Care – PPO | Attending: Physical Medicine and Rehabilitation

## 2021-05-05 ENCOUNTER — Other Ambulatory Visit: Payer: Self-pay

## 2021-05-05 DIAGNOSIS — M25612 Stiffness of left shoulder, not elsewhere classified: Secondary | ICD-10-CM | POA: Diagnosis not present

## 2021-05-05 DIAGNOSIS — M25512 Pain in left shoulder: Secondary | ICD-10-CM | POA: Insufficient documentation

## 2021-05-05 DIAGNOSIS — G8929 Other chronic pain: Secondary | ICD-10-CM | POA: Insufficient documentation

## 2021-05-05 DIAGNOSIS — I69354 Hemiplegia and hemiparesis following cerebral infarction affecting left non-dominant side: Secondary | ICD-10-CM | POA: Insufficient documentation

## 2021-05-05 DIAGNOSIS — M6281 Muscle weakness (generalized): Secondary | ICD-10-CM | POA: Insufficient documentation

## 2021-05-05 NOTE — Therapy (Signed)
[facility] Outpatient Rehabilitation [facility] [address] [place], [place], [address] Phone: [phone]   Fax:  [phone]  Physical Therapy Treatment  Patient Details  Name: Shannon Garza MRN: [id number] Date of Birth: [birth year] Referring Provider (PT): Carlis Abbott Drema Pry, MD   Encounter Date: 05/05/2021   PT End of Session - 05/05/21 1754     Visit Number 2    Number of Visits 9    Date for PT Re-Evaluation 06/15/21    Authorization Type BCBS    Progress Note Due on Visit 10    PT Start Time 1755   arrived late   PT Stop Time 1826    PT Time Calculation (min) 31 min    Activity Tolerance Patient tolerated treatment well    Behavior During Therapy [facility] for tasks assessed/performed             Past Medical History:  Diagnosis Date   Diabetes mellitus without complication (HCC)    Hypertension     No past surgical history on file.  There were no vitals filed for this visit.   Subjective Assessment - 05/05/21 1754     Subjective Pt presents to PT with no current reports of L shoulder pain, although she does have pain with movement. Pt has not been compliant with her HEP per reports. She is ready to begin PT treatment at this time.    Currently in Pain? No/denies    Pain Score 0-No pain           [facility] Adult PT Treatment/Exercise:   Therapeutic Exercise:  UBE lvl 1.0 x 4 min (83fwd/2bwd) while taking subjective Row 3x10 red tband Shoulder ext red tband 2x10 Seated horizontal abd 2x10 yellow tband Table slide 2x10 flex/abd L shoulder L shoulder IR/ER isometric x10 ea - 5 sec hold                               PT Short Term Goals - 04/20/21 1832       PT SHORT TERM GOAL #1   Title Pt will be compliant with 90% of HEP in order to improve comfort and functional ability    Baseline initial HEP given    Time 3    Period Weeks    Status New    Target Date 05/11/21               PT Long Term Goals -  04/20/21 1833       PT LONG TERM GOAL #1   Title Pt will decrease Quick DASH score to no greater than 5% as proxy for functional improvement    Baseline 20% disability    Time 8    Period Weeks    Status New    Target Date 06/15/21      PT LONG TERM GOAL #2   Title Pt will self report L shoulder pain no greater than 3/10 at worst in order to improve comfort and function    Baseline 5/10 at worst    Time 8    Period Weeks    Status New    Target Date 06/15/21      PT LONG TERM GOAL #3   Title Pt will increase L shoulder flex/abd to no less than 140 degrees in order to improve functional ability    Baseline see flowsheet    Time 8    Period Weeks    Status New  Target Date 06/15/21                   Plan - 05/05/21 1759     Clinical Impression Statement Pt was able to complete prescribed exercises with no adverse effect. Today's session focused on increasing periscapular musculature and increasing L shoulder ROM. HEP updated for AAROM stretches at home along with median nerve glide to reduce neural tension. Pt is progressing as expected, with PT encouraging more compliance HEP. Will continue to progress as tolerated per POC.    PT Treatment/Interventions ADLs/Self Care Home Management;Biofeedback;Gait training;Stair training;Functional mobility training;Therapeutic activities;Therapeutic exercise;Balance training;Neuromuscular re-education;Orthotic Fit/Training;Patient/family education;Moist Heat;Cryotherapy;Electrical Stimulation;Manual techniques;Taping;Vasopneumatic Device    PT Next Visit Plan progress periscapular and RTC strength as tolerated    PT Home Exercise Plan Access Code: [email or web address]             Patient will benefit from skilled therapeutic intervention in order to improve the following deficits and impairments:  Abnormal gait, Decreased strength, Impaired sensation, Postural dysfunction, Decreased balance, Decreased knowledge of use of DME, Impaired  flexibility, Decreased endurance, Decreased range of motion, Impaired UE functional use, Pain  Visit Diagnosis: Stiffness of left shoulder, not elsewhere classified  Chronic left shoulder pain  Hemiplegia and hemiparesis following cerebral infarction affecting left non-dominant side (HCC)  Muscle weakness (generalized)     Problem List Patient Active Problem List   Diagnosis Date Noted   History of stroke 01/17/2021   Dyslipidemia 01/17/2021   CVA (cerebral vascular accident) (HCC) 10/18/2020   Primary hypertension    Left arm weakness    Left leg weakness    Subcortical infarction (HCC) 10/12/2020    Eloy End, PT 05/05/2021, 6:28 PM  [facility] [address] [place], [place], [address] Phone: [phone]   Fax:  [phone]  Name: Shannon Garza MRN: [id number] Date of Birth: May 12, 1966

## 2021-05-11 ENCOUNTER — Ambulatory Visit: Payer: BC Managed Care – PPO | Admitting: Physical Therapy

## 2021-05-12 ENCOUNTER — Other Ambulatory Visit: Payer: Self-pay

## 2021-05-12 ENCOUNTER — Ambulatory Visit: Payer: BC Managed Care – PPO | Admitting: Physical Therapy

## 2021-05-12 ENCOUNTER — Encounter: Payer: Self-pay | Admitting: Physical Therapy

## 2021-05-12 DIAGNOSIS — I69354 Hemiplegia and hemiparesis following cerebral infarction affecting left non-dominant side: Secondary | ICD-10-CM

## 2021-05-12 DIAGNOSIS — M6281 Muscle weakness (generalized): Secondary | ICD-10-CM | POA: Diagnosis not present

## 2021-05-12 DIAGNOSIS — M25512 Pain in left shoulder: Secondary | ICD-10-CM | POA: Diagnosis not present

## 2021-05-12 DIAGNOSIS — M25612 Stiffness of left shoulder, not elsewhere classified: Secondary | ICD-10-CM | POA: Diagnosis not present

## 2021-05-12 DIAGNOSIS — G8929 Other chronic pain: Secondary | ICD-10-CM

## 2021-05-12 NOTE — Therapy (Signed)
[facility] Outpatient Rehabilitation [facility] [address] [place], [place], [address] Phone: [phone]   Fax:  [phone]  Physical Therapy Treatment  Patient Details  Name: Shannon Garza MRN: [id number] Date of Birth: 1966/02/20 Referring Provider (PT): Carlis Abbott Drema Pry, MD   Encounter Date: 05/12/2021   PT End of Session - 05/12/21 1752     Visit Number 3    Number of Visits 9    Date for PT Re-Evaluation 06/15/21    Authorization Type BCBS    Progress Note Due on Visit 10    PT Start Time 1751    PT Stop Time 1830    PT Time Calculation (min) 39 min    Activity Tolerance Patient tolerated treatment well    Behavior During Therapy [facility] for tasks assessed/performed             Past Medical History:  Diagnosis Date   Diabetes mellitus without complication (HCC)    Hypertension     History reviewed. No pertinent surgical history.  There were no vitals filed for this visit.   Subjective Assessment - 05/12/21 1756     Subjective Pt reports that she feels that her L shoulder is doing somewhat better.  She has been HEP compliant.  She has 0/10 pain currently.                [facility] PT Assessment - 05/12/21 0001       AROM   Left Shoulder Flexion 107 Degrees    Left Shoulder ABduction 85 Degrees            Therapeutic Exercise:  UBE lvl 1.5 x 4 min (25fwd/2bwd) while taking subjective Row 3x10 green tband Shoulder ext red tband 2x10 Seated horizontal abd 2x10 yellow tband UE ranger L shoulder flexion @ 30'' 3x10 L shoulder ER YTB - 3x10  L shoulder IR YTB - 3x10     PT Short Term Goals - 05/12/21 1755       PT SHORT TERM GOAL #1   Title Pt will be compliant with 90% of HEP in order to improve comfort and functional ability    Baseline initial HEP given    Time 3    Period Weeks    Status Achieved    Target Date 05/11/21               PT Long Term Goals - 04/20/21 1833       PT LONG TERM GOAL #1   Title  Pt will decrease Quick DASH score to no greater than 5% as proxy for functional improvement    Baseline 20% disability    Time 8    Period Weeks    Status New    Target Date 06/15/21      PT LONG TERM GOAL #2   Title Pt will self report L shoulder pain no greater than 3/10 at worst in order to improve comfort and function    Baseline 5/10 at worst    Time 8    Period Weeks    Status New    Target Date 06/15/21      PT LONG TERM GOAL #3   Title Pt will increase L shoulder flex/abd to no less than 140 degrees in order to improve functional ability    Baseline see flowsheet    Time 8    Period Weeks    Status New    Target Date 06/15/21  Plan - 05/12/21 1828     Clinical Impression Statement Pt reports no increase in baseline pain following therapy  HEP was reviewed, but left unchanged    Overall, Ascencion Morss is progressing well with therapy.  Today we concentrated on rotator cuff strengthening, periscapular strengthening, and shoulder range of motion.  Pt improved abd AROM significantly.  She fatigues quickly but is able to complete with good form.  Pt will continue to benefit from skilled physical therapy to address remaining deficits and achieve listed goals.  Continue per POC.    PT Treatment/Interventions ADLs/Self Care Home Management;Biofeedback;Gait training;Stair training;Functional mobility training;Therapeutic activities;Therapeutic exercise;Balance training;Neuromuscular re-education;Orthotic Fit/Training;Patient/family education;Moist Heat;Cryotherapy;Electrical Stimulation;Manual techniques;Taping;Vasopneumatic Device    PT Next Visit Plan progress periscapular and RTC strength as tolerated    PT Home Exercise Plan Access Code: [email or web address]             Patient will benefit from skilled therapeutic intervention in order to improve the following deficits and impairments:  Abnormal gait, Decreased strength, Impaired sensation, Postural  dysfunction, Decreased balance, Decreased knowledge of use of DME, Impaired flexibility, Decreased endurance, Decreased range of motion, Impaired UE functional use, Pain  Visit Diagnosis: Stiffness of left shoulder, not elsewhere classified  Chronic left shoulder pain  Hemiplegia and hemiparesis following cerebral infarction affecting left non-dominant side (HCC)  Muscle weakness (generalized)     Problem List Patient Active Problem List   Diagnosis Date Noted   History of stroke 01/17/2021   Dyslipidemia 01/17/2021   CVA (cerebral vascular accident) (HCC) 10/18/2020   Primary hypertension    Left arm weakness    Left leg weakness    Subcortical infarction (HCC) 10/12/2020    Fredderick Phenix, PT 05/12/2021, 6:28 PM  [facility] Out[facility] [address] [place], [place], [address] Phone: [phone]   Fax:  [phone]  Name: Rosabell Geyer MRN: [id number] Date of Birth: [birth year]

## 2021-05-18 ENCOUNTER — Ambulatory Visit: Payer: BC Managed Care – PPO

## 2021-05-25 ENCOUNTER — Encounter: Payer: Self-pay | Admitting: Physical Therapy

## 2021-05-25 ENCOUNTER — Ambulatory Visit: Payer: BC Managed Care – PPO | Admitting: Physical Therapy

## 2021-05-25 ENCOUNTER — Other Ambulatory Visit: Payer: Self-pay

## 2021-05-25 DIAGNOSIS — G8929 Other chronic pain: Secondary | ICD-10-CM | POA: Diagnosis not present

## 2021-05-25 DIAGNOSIS — M25612 Stiffness of left shoulder, not elsewhere classified: Secondary | ICD-10-CM | POA: Diagnosis not present

## 2021-05-25 DIAGNOSIS — I69354 Hemiplegia and hemiparesis following cerebral infarction affecting left non-dominant side: Secondary | ICD-10-CM | POA: Diagnosis not present

## 2021-05-25 DIAGNOSIS — M6281 Muscle weakness (generalized): Secondary | ICD-10-CM

## 2021-05-25 DIAGNOSIS — M25512 Pain in left shoulder: Secondary | ICD-10-CM | POA: Diagnosis not present

## 2021-05-25 NOTE — Therapy (Signed)
[place], [place], [address] Phone: [phone]   Fax:  [phone]  PHYSICAL THERAPY DISCHARGE SUMMARY  Visits from Start of Care: 4  Current functional level related to goals / functional outcomes: See assessment/goals   Remaining deficits: See assessment/goals   Education / Equipment: HEP and D/C plans  Patient agrees to discharge. Patient goals were partially met. Patient is being discharged due to being pleased with the current functional level.   Physical Therapy Treatment  Patient Details  Name: Shannon Garza MRN: [id number] Date of Birth: 08-09-1966 Referring Provider (PT): Ranell Patrick, Clide Deutscher, MD   Encounter Date: 05/25/2021   PT End of Session - 05/25/21 1659     Visit Number 4    Number of Visits 9    Date for PT Re-Evaluation 06/15/21    Authorization Type BCBS    Progress Note Due on Visit 10    PT Start Time 0500    PT Stop Time 0530    PT Time Calculation (min) 30 min    Activity Tolerance Patient tolerated treatment well    Behavior During Therapy WFL for tasks assessed/performed             Past Medical History:  Diagnosis Date   Diabetes mellitus without complication ([place])    Hypertension     History reviewed. No pertinent surgical history.  There were no vitals filed for this visit.  Shoulder flexion to 110 degrees  Quick dash 15% disability   Therapeutic Exercise:  UBE lvl 1.5 x 4 min (104fd/2bwd) while taking subjective Row 3x10 black tband Shoulder ext blue  tband 2x10 Seated horizontal abd 2x10 yellow tband L shoulder ER YTB - 3x10  L shoulder IR YTB - 3x10       PT Short Term Goals - 05/12/21 1755       PT SHORT TERM GOAL #1   Title Pt will be compliant with 90% of HEP in order to improve comfort and functional ability    Baseline initial HEP given    Time 3    Period Weeks    Status Achieved    Target Date 05/11/21                PT Long Term Goals - 05/25/21 1716       PT LONG TERM GOAL #1   Title Pt will decrease Quick DASH score to no greater than 5% as proxy for functional improvement    Baseline 20% disability 9/28: 15%    Time 8    Period Weeks    Status Partially Met      PT LONG TERM GOAL #2   Title Pt will self report L shoulder pain no greater than 3/10 at worst in order to improve comfort and function    Baseline 5/10 at worst 9/28: MET    Time 8    Period Weeks    Status Achieved      PT LONG TERM GOAL #3   Title Pt will increase L shoulder flex/abd to no less than 140 degrees in order to improve functional ability    Baseline see flowsheet 9/28: 110    Time 8    Period Weeks    Status Partially Met                   Plan - 05/25/21 1739     Clinical Impression Statement GAntonieta Perthas progressed well with therapy.  Improved  impairments include: shoulder ROM and shoulder pain.  Functional improvements include: ability to complete housework and lift light and moderate weight objects below shoulder level.  Progressions needed include: continued consistent stretching and strengthening at home via HEP.  Barriers to progress include: L sided weakness secondary to CVA.  Please see baseline and/or status section in "Goals" for specific progress on short term and long term goals established at evaluation.  I recommend D/C home with HEP; pt agrees with plan.    PT Treatment/Interventions ADLs/Self Care Home Management;Biofeedback;Gait training;Stair training;Functional mobility training;Therapeutic activities;Therapeutic exercise;Balance training;Neuromuscular re-education;Orthotic Fit/Training;Patient/family education;Moist Heat;Cryotherapy;Electrical Stimulation;Manual techniques;Taping;Vasopneumatic Device    PT Next Visit Plan progress periscapular and RTC strength as tolerated    PT Home Exercise Plan Access Code: [email or web address]             Patient will benefit from skilled  therapeutic intervention in order to improve the following deficits and impairments:  Abnormal gait, Decreased strength, Impaired sensation, Postural dysfunction, Decreased balance, Decreased knowledge of use of DME, Impaired flexibility, Decreased endurance, Decreased range of motion, Impaired UE functional use, Pain  Visit Diagnosis: Stiffness of left shoulder, not elsewhere classified  Chronic left shoulder pain  Hemiplegia and hemiparesis following cerebral infarction affecting left non-dominant side (HCC)  Muscle weakness (generalized)     Problem List Patient Active Problem List   Diagnosis Date Noted   History of stroke 01/17/2021   Dyslipidemia 01/17/2021   CVA (cerebral vascular accident) ([place]) 10/18/2020   Primary hypertension    Left arm weakness    Left leg weakness    Subcortical infarction ([place]) 10/12/2020    Mathis Dad, PT 05/25/2021, 5:41 PM  [facility] [address] [place], [place], [address] Phone: [phone]   Fax:  [phone]  Name: Shannon Garza MRN: [id number] Date of Birth: 1965-12-12

## 2021-07-01 ENCOUNTER — Encounter: Payer: BC Managed Care – PPO | Admitting: Physical Medicine and Rehabilitation

## 2021-07-14 ENCOUNTER — Ambulatory Visit (INDEPENDENT_AMBULATORY_CARE_PROVIDER_SITE_OTHER): Payer: BC Managed Care – PPO | Admitting: Neurology

## 2021-07-14 ENCOUNTER — Other Ambulatory Visit: Payer: Self-pay

## 2021-07-14 DIAGNOSIS — Z8673 Personal history of transient ischemic attack (TIA), and cerebral infarction without residual deficits: Secondary | ICD-10-CM

## 2021-07-14 DIAGNOSIS — I6381 Other cerebral infarction due to occlusion or stenosis of small artery: Secondary | ICD-10-CM

## 2021-07-14 DIAGNOSIS — R0681 Apnea, not elsewhere classified: Secondary | ICD-10-CM

## 2021-07-14 DIAGNOSIS — R030 Elevated blood-pressure reading, without diagnosis of hypertension: Secondary | ICD-10-CM

## 2021-07-14 DIAGNOSIS — G4733 Obstructive sleep apnea (adult) (pediatric): Secondary | ICD-10-CM

## 2021-07-14 DIAGNOSIS — R351 Nocturia: Secondary | ICD-10-CM

## 2021-07-14 DIAGNOSIS — G4720 Circadian rhythm sleep disorder, unspecified type: Secondary | ICD-10-CM

## 2021-07-14 DIAGNOSIS — R011 Cardiac murmur, unspecified: Secondary | ICD-10-CM

## 2021-07-20 ENCOUNTER — Ambulatory Visit: Payer: BC Managed Care – PPO | Admitting: Emergency Medicine

## 2021-07-25 NOTE — Procedures (Signed)
PATIENT'S NAME:  Shannon Garza, Shannon Garza DOB:      1966-02-15      MR#:    [id number]     DATE OF RECORDING: 07/14/2021 REFERRING M.D.:  Fulton Mole, MD Study Performed:   Baseline Polysomnogram HISTORY: 55 year old woman with a history of hypertension, diabetes, stroke in 2017 (when she was in [place]), lacunar stroke in February 2022, hyperlipidemia, and severe obesity with a BMI of over 40, who reports witnessed breathing pauses and daytime tiredness. The patient endorsed the Epworth Sleepiness Scale at 8 points. The patient's weight 256 pounds with a height of 64 (inches), resulting in a BMI of 43.7 kg/m2. The patient's neck circumference measured 15 inches.  CURRENT MEDICATIONS: Aspirin, Lipitor, Vitamin B complex, Zestril, Tumeric, Vitamin D, Tylenol   PROCEDURE:  This is a multichannel digital polysomnogram utilizing the Somnostar 11.2 system.  Electrodes and sensors were applied and monitored per AASM Specifications.   EEG, EOG, Chin and Limb EMG, were sampled at 200 Hz.  ECG, Snore and Nasal Pressure, Thermal Airflow, Respiratory Effort, CPAP Flow and Pressure, Oximetry was sampled at 50 Hz. Digital video and audio were recorded.      BASELINE STUDY  Lights Out was at 20:51 and Lights On at 04:49. She felt cold and a heater was placed in her room. She used her tablet computer intermittently during the study.  Total recording time (TRT) was 478.5 minutes, with a total sleep time (TST) of 214 minutes.   The patient's sleep latency was 29 minutes.  REM latency was 78.5 minutes.  The sleep efficiency was 44.7 %, which is markedly reduced.     SLEEP ARCHITECTURE: WASO (Wake after sleep onset) was 228 minutes with moderate sleep fragmentation and several longer periods of wakefulness. There were 26.5 minutes in Stage N1, 97.5 minutes Stage N2, 58 minutes Stage N3 and 32 minutes in Stage REM.  The percentage of Stage N1 was 12.4%, which is increased, Stage N2 was 45.6%, Stage N3 was 27.1% and Stage R (REM  sleep) was 15.%, which is reduced. The arousals were noted as: 35 were spontaneous, 0 were associated with PLMs, 0 were associated with respiratory events.  RESPIRATORY ANALYSIS:  There were a total of 9 respiratory events:  0 obstructive apneas, 0 central apneas and 0 mixed apneas with a total of 0 apneas and an apnea index (AI) of 0 /hour. There were 9 hypopneas with a hypopnea index of 2.5 /hour. The patient also had 0 respiratory event related arousals (RERAs).      The total APNEA/HYPOPNEA INDEX (AHI) was 2.5/hour and the total RESPIRATORY DISTURBANCE INDEX was  2.5 /hour.  8 events occurred in REM sleep and 2 events in NREM. The REM AHI was  15 /hour, versus a non-REM AHI of .3. The patient spent 0.5 minutes of total sleep time in the supine position and 214 minutes in non-supine.. The supine AHI was 0.0 versus a non-supine AHI of 2.5.  OXYGEN SATURATION & C02:  The Wake baseline 02 saturation was 98%, with the lowest being 88%. Time spent below 89% saturation equaled 0 minutes. PERIODIC LIMB MOVEMENTS:   The patient had a total of 0 Periodic Limb Movements.  The Periodic Limb Movement (PLM) index was 0 and the PLM Arousal index was 0/hour.  Audio and video analysis did not show any abnormal or unusual movements, behaviors, phonations or vocalizations. The patient took 6 bathroom breaks. No significant snoring was noted. The EKG was in keeping with normal sinus rhythm (NSR).  Post-study, the patient indicated that sleep was worse than usual.   IMPRESSION:  Dysfunctions associated with sleep stages or arousal from sleep  RECOMMENDATIONS:  This study was limited due to decreased sleep efficiency and poor sleep consolidation as well as absence of supine sleep.  This study does not indicate any significant obstructive or central sleep disordered breathing and the patient does not qualify for positive airway pressure treatment.  This study shows sleep fragmentation and abnormal sleep stage  percentages; these are nonspecific findings and per se do not signify an intrinsic sleep disorder or a cause for the patient's sleep-related symptoms. Causes include (but are not limited to) the first night effect of the sleep study, circadian rhythm disturbances, medication effect or an underlying mood disorder or medical problem.  The patient should be cautioned not to drive, work at heights, or operate dangerous or heavy equipment when tired or sleepy. Review and reiteration of good sleep hygiene measures should be pursued with any patient. The patient can follow-up with her referring provider, who will be notified of the test results.   I certify that I have reviewed the entire raw data recording prior to the issuance of this report in accordance with the Standards of Accreditation of the American Academy of Sleep Medicine (AASM)  Huston Foley, MD, PhD Diplomat, American Board of Neurology and Sleep Medicine (Neurology and Sleep Medicine)

## 2021-07-26 ENCOUNTER — Telehealth: Payer: Self-pay | Admitting: *Deleted

## 2021-07-26 NOTE — Telephone Encounter (Signed)
Spoke to patient about results of Sleep Study  Per Dr. Frances Furbish recent sleep study did not show any significant obstructive sleep apnea. However, she did not sleep very well and the study was limited due to decreased sleep efficiency and poor sleep consolidation as well as absence of supine sleep. Based on this study, she does not have any significant obstructive or central sleep disordered breathing and the patient does not qualify for positive airway pressure treatment. Please inform patient that she can follow up with Dr. Pearlean Brownie as scheduled at this point. Thanks,    Pt thanked me for calling.

## 2021-07-28 ENCOUNTER — Encounter: Payer: Self-pay | Admitting: Emergency Medicine

## 2021-07-28 ENCOUNTER — Other Ambulatory Visit: Payer: Self-pay

## 2021-07-28 ENCOUNTER — Ambulatory Visit (INDEPENDENT_AMBULATORY_CARE_PROVIDER_SITE_OTHER): Payer: BC Managed Care – PPO | Admitting: Emergency Medicine

## 2021-07-28 VITALS — BP 136/86 | HR 104 | Ht 64.0 in | Wt 251.0 lb

## 2021-07-28 DIAGNOSIS — I10 Essential (primary) hypertension: Secondary | ICD-10-CM

## 2021-07-28 DIAGNOSIS — E785 Hyperlipidemia, unspecified: Secondary | ICD-10-CM | POA: Diagnosis not present

## 2021-07-28 DIAGNOSIS — Z8673 Personal history of transient ischemic attack (TIA), and cerebral infarction without residual deficits: Secondary | ICD-10-CM | POA: Diagnosis not present

## 2021-07-28 MED ORDER — LOSARTAN POTASSIUM-HCTZ 50-12.5 MG PO TABS: 1.0000 | ORAL_TABLET | Freq: Every day | ORAL | 3 refills | Status: DC

## 2021-07-28 NOTE — Assessment & Plan Note (Signed)
Secondary prevention measures discussed with patient. Blood pressure and cholesterol control emphasized. Continue daily baby aspirin. Fall precautions given.

## 2021-07-28 NOTE — Assessment & Plan Note (Signed)
Diet and nutrition discussed. Continue atorvastatin 40 mg daily. 

## 2021-07-28 NOTE — Patient Instructions (Signed)

## 2021-07-28 NOTE — Assessment & Plan Note (Signed)
Elevated blood pressure readings at home. Will change lisinopril to losartan-HCTZ 50-12.5 mg daily. Advised to continue monitoring blood pressure readings at home and keep a log. Dietary approaches to stop hypertension discussed.

## 2021-07-28 NOTE — Progress Notes (Signed)
Shannon Garza 55 y.o.   Chief Complaint  Patient presents with   Hypertension    3 mon f/u    HISTORY OF PRESENT ILLNESS: This is a 55 y.o. female with history of hypertension here for 29-month follow-up. Presently on lisinopril 40 mg daily.  Blood pressure readings at home have been on the high side. Status post stroke 7 months ago, second stroke. Takes 1 baby aspirins daily and atorvastatin for dyslipidemia. No complaints or any other medical concerns today. BP Readings from Last 3 Encounters:  07/28/21 136/86  05/03/21 (!) 185/78  04/19/21 130/70     Hypertension Pertinent negatives include no chest pain, headaches, palpitations or shortness of breath.    Prior to Admission medications   Medication Sig Start Date End Date Taking? Authorizing Provider  acetaminophen (TYLENOL) 325 MG tablet Take 2 tablets (650 mg total) by mouth every 4 (four) hours as needed for mild pain (or temp > 37.5 C (99.5 F)). 10/18/20  Yes Martyn Malay, MD  aspirin EC 81 MG tablet Take 1 tablet (81 mg total) by mouth daily. Swallow whole. 04/19/21  Yes Andric Kerce, Ines Bloomer, MD  atorvastatin (LIPITOR) 40 MG tablet Take 1 tablet (40 mg total) by mouth at bedtime. 01/12/21  Yes Raulkar, Clide Deutscher, MD  b complex vitamins capsule Take 1 capsule by mouth daily. 01/12/21  Yes Raulkar, Clide Deutscher, MD  lisinopril (ZESTRIL) 40 MG tablet Take 1 tablet (40 mg total) by mouth daily. 04/19/21  Yes Adreona Brand, Ines Bloomer, MD  Turmeric (QC TUMERIC COMPLEX PO) Take 1 tablet by mouth daily.   Yes [provider]  Vitamin D, Ergocalciferol, (DRISDOL) 1.25 MG (50000 UNIT) CAPS capsule Take 1 capsule (50,000 Units total) by mouth every 7 (seven) days. 05/04/21  Yes Raulkar, Clide Deutscher, MD    No Known Allergies  Patient Active Problem List   Diagnosis Date Noted   History of stroke 01/17/2021   Dyslipidemia 01/17/2021   CVA (cerebral vascular accident) ([place]) 10/18/2020   Primary hypertension    Left arm  weakness    Left leg weakness    Subcortical infarction ([place]) 10/12/2020    Past Medical History:  Diagnosis Date   Diabetes mellitus without complication ([place])    Hypertension     No past surgical history on file.  Social History   Socioeconomic History   Marital status: Single    Spouse name: Not on file   Number of children: Not on file   Years of education: Not on file   Highest education level: Not on file  Occupational History   Not on file  Tobacco Use   Smoking status: Never   Smokeless tobacco: Never  Vaping Use   Vaping Use: Never used  Substance and Sexual Activity   Alcohol use: Not Currently   Drug use: Not Currently   Sexual activity: Not Currently  Other Topics Concern   Not on file  Social History Narrative   Lives with sister in law and brother, and sister in own home   Right Handed   Drinks no caffeine   Social Determinants of Health   Financial Resource Strain: Not on file  Food Insecurity: Not on file  Transportation Needs: Not on file  Physical Activity: Not on file  Stress: Not on file  Social Connections: Not on file  Intimate Partner Violence: Not on file    Family History  Problem Relation Age of Onset   Sleep apnea Neg Hx  Review of Systems  Constitutional: Negative.  Negative for chills and fever.  HENT: Negative.  Negative for congestion and sore throat.   Respiratory: Negative.  Negative for cough and shortness of breath.   Cardiovascular: Negative.  Negative for chest pain and palpitations.  Gastrointestinal: Negative.  Negative for abdominal pain, diarrhea, nausea and vomiting.  Genitourinary: Negative.  Negative for dysuria and hematuria.  Skin: Negative.  Negative for rash.  Neurological: Negative.  Negative for dizziness and headaches.  All other systems reviewed and are negative.  Today's Vitals   07/28/21 1049  BP: 136/86  Pulse: (!) 104  SpO2: 99%  Weight: 251 lb (113.9 kg)  Height: 5\' 4"  (1.626 m)    Body mass index is 43.08 kg/m. Wt Readings from Last 3 Encounters:  07/28/21 251 lb (113.9 kg)  05/03/21 256 lb 9.6 oz (116.4 kg)  04/19/21 251 lb (113.9 kg)    Physical Exam Vitals reviewed.  Constitutional:      Appearance: Normal appearance.  HENT:     Head: Normocephalic.  Eyes:     Extraocular Movements: Extraocular movements intact.     Conjunctiva/sclera: Conjunctivae normal.     Pupils: Pupils are equal, round, and reactive to light.  Cardiovascular:     Rate and Rhythm: Normal rate and regular rhythm.     Pulses: Normal pulses.     Heart sounds: Murmur (Aortic systolic 3/6) heard.  Pulmonary:     Effort: Pulmonary effort is normal.     Breath sounds: Normal breath sounds.  Musculoskeletal:     Cervical back: Normal range of motion and neck supple. No tenderness.     Right lower leg: No edema.     Left lower leg: No edema.  Lymphadenopathy:     Cervical: No cervical adenopathy.  Skin:    General: Skin is warm and dry.     Capillary Refill: Capillary refill takes less than 2 seconds.  Neurological:     General: No focal deficit present.     Mental Status: She is alert and oriented to person, place, and time.  Psychiatric:        Mood and Affect: Mood normal.        Behavior: Behavior normal.     ASSESSMENT & PLAN: Problem List Items Addressed This Visit       Cardiovascular and Mediastinum   Primary hypertension - Primary    Elevated blood pressure readings at home. Will change lisinopril to losartan-HCTZ 50-12.5 mg daily. Advised to continue monitoring blood pressure readings at home and keep a log. Dietary approaches to stop hypertension discussed.      Relevant Medications   losartan-hydrochlorothiazide (HYZAAR) 50-12.5 MG tablet     Other   History of stroke    Secondary prevention measures discussed with patient. Blood pressure and cholesterol control emphasized. Continue daily baby aspirin. Fall precautions given.      Dyslipidemia     Diet and nutrition discussed.  Continue atorvastatin 40 mg daily.        Patient Instructions  Hypertension, Adult High blood pressure (hypertension) is when the force of blood pumping through the arteries is too strong. The arteries are the blood vessels that carry blood from the heart throughout the body. Hypertension forces the heart to work harder to pump blood and may cause arteries to become narrow or stiff. Untreated or uncontrolled hypertension can cause a heart attack, heart failure, a stroke, kidney disease, and other problems. A blood pressure reading consists of a  higher number over a lower number. Ideally, your blood pressure should be below 120/80. The first ("top") number is called the systolic pressure. It is a measure of the pressure in your arteries as your heart beats. The second ("bottom") number is called the diastolic pressure. It is a measure of the pressure in your arteries as the heart relaxes. What are the causes? The exact cause of this condition is not known. There are some conditions that result in or are related to high blood pressure. What increases the risk? Some risk factors for high blood pressure are under your control. The following factors may make you more likely to develop this condition: Smoking. Having type 2 diabetes mellitus, high cholesterol, or both. Not getting enough exercise or physical activity. Being overweight. Having too much fat, sugar, calories, or salt (sodium) in your diet. Drinking too much alcohol. Some risk factors for high blood pressure may be difficult or impossible to change. Some of these factors include: Having chronic kidney disease. Having a family history of high blood pressure. Age. Risk increases with age. Race. You may be at higher risk if you are African American. Gender. Men are at higher risk than women before age 2. After age 109, women are at higher risk than men. Having obstructive sleep apnea. Stress. What are  the signs or symptoms? High blood pressure may not cause symptoms. Very high blood pressure (hypertensive crisis) may cause: Headache. Anxiety. Shortness of breath. Nosebleed. Nausea and vomiting. Vision changes. Severe chest pain. Seizures. How is this diagnosed? This condition is diagnosed by measuring your blood pressure while you are seated, with your arm resting on a flat surface, your legs uncrossed, and your feet flat on the floor. The cuff of the blood pressure monitor will be placed directly against the skin of your upper arm at the level of your heart. It should be measured at least twice using the same arm. Certain conditions can cause a difference in blood pressure between your right and left arms. Certain factors can cause blood pressure readings to be lower or higher than normal for a short period of time: When your blood pressure is higher when you are in a health care provider's office than when you are at home, this is called white coat hypertension. Most people with this condition do not need medicines. When your blood pressure is higher at home than when you are in a health care provider's office, this is called masked hypertension. Most people with this condition may need medicines to control blood pressure. If you have a high blood pressure reading during one visit or you have normal blood pressure with other risk factors, you may be asked to: Return on a different day to have your blood pressure checked again. Monitor your blood pressure at home for 1 week or longer. If you are diagnosed with hypertension, you may have other blood or imaging tests to help your health care provider understand your overall risk for other conditions. How is this treated? This condition is treated by making healthy lifestyle changes, such as eating healthy foods, exercising more, and reducing your alcohol intake. Your health care provider may prescribe medicine if lifestyle changes are not  enough to get your blood pressure under control, and if: Your systolic blood pressure is above 130. Your diastolic blood pressure is above 80. Your personal target blood pressure may vary depending on your medical conditions, your age, and other factors. Follow these instructions at home: Eating and drinking  Eat a diet that is high in fiber and potassium, and low in sodium, added sugar, and fat. An example eating plan is called the DASH (Dietary Approaches to Stop Hypertension) diet. To eat this way: Eat plenty of fresh fruits and vegetables. Try to fill one half of your plate at each meal with fruits and vegetables. Eat whole grains, such as whole-wheat pasta, brown rice, or whole-grain bread. Fill about one fourth of your plate with whole grains. Eat or drink low-fat dairy products, such as skim milk or low-fat yogurt. Avoid fatty cuts of meat, processed or cured meats, and poultry with skin. Fill about one fourth of your plate with lean proteins, such as fish, chicken without skin, beans, eggs, or tofu. Avoid pre-made and processed foods. These tend to be higher in sodium, added sugar, and fat. Reduce your daily sodium intake. Most people with hypertension should eat less than 1,500 mg of sodium a day. Do not drink alcohol if: Your health care provider tells you not to drink. You are pregnant, may be pregnant, or are planning to become pregnant. If you drink alcohol: Limit how much you use to: 0-1 drink a day for women. 0-2 drinks a day for men. Be aware of how much alcohol is in your drink. In the [place] one drink equals one 12 oz bottle of beer (355 mL), one 5 oz glass of wine (148 mL), or one 1 oz glass of hard liquor (44 mL). Lifestyle  Work with your health care provider to maintain a healthy body weight or to lose weight. Ask what an ideal weight is for you. Get at least 30 minutes of exercise most days of the week. Activities may include walking, swimming, or biking. Include  exercise to strengthen your muscles (resistance exercise), such as Pilates or lifting weights, as part of your weekly exercise routine. Try to do these types of exercises for 30 minutes at least 3 days a week. Do not use any products that contain nicotine or tobacco, such as cigarettes, e-cigarettes, and chewing tobacco. If you need help quitting, ask your health care provider. Monitor your blood pressure at home as told by your health care provider. Keep all follow-up visits as told by your health care provider. This is important. Medicines Take over-the-counter and prescription medicines only as told by your health care provider. Follow directions carefully. Blood pressure medicines must be taken as prescribed. Do not skip doses of blood pressure medicine. Doing this puts you at risk for problems and can make the medicine less effective. Ask your health care provider about side effects or reactions to medicines that you should watch for. Contact a health care provider if you: Think you are having a reaction to a medicine you are taking. Have headaches that keep coming back (recurring). Feel dizzy. Have swelling in your ankles. Have trouble with your vision. Get help right away if you: Develop a severe headache or confusion. Have unusual weakness or numbness. Feel faint. Have severe pain in your chest or abdomen. Vomit repeatedly. Have trouble breathing. Summary Hypertension is when the force of blood pumping through your arteries is too strong. If this condition is not controlled, it may put you at risk for serious complications. Your personal target blood pressure may vary depending on your medical conditions, your age, and other factors. For most people, a normal blood pressure is less than 120/80. Hypertension is treated with lifestyle changes, medicines, or a combination of both. Lifestyle changes include losing weight, eating a  healthy, low-sodium diet, exercising more, and limiting  alcohol. This information is not intended to replace advice given to you by your health care provider. Make sure you discuss any questions you have with your health care provider. Document Revised: 04/24/2018 Document Reviewed: 04/24/2018 Elsevier Patient Education  2022 [place], MD [facility]

## 2021-09-05 ENCOUNTER — Telehealth: Payer: Self-pay | Admitting: Emergency Medicine

## 2021-09-05 ENCOUNTER — Other Ambulatory Visit (HOSPITAL_COMMUNITY): Payer: Self-pay

## 2021-09-05 ENCOUNTER — Other Ambulatory Visit: Payer: Self-pay | Admitting: Emergency Medicine

## 2021-09-05 DIAGNOSIS — I10 Essential (primary) hypertension: Secondary | ICD-10-CM

## 2021-09-05 MED ORDER — LISINOPRIL 40 MG PO TABS
40.0000 mg | ORAL_TABLET | Freq: Every day | ORAL | 3 refills | Status: DC
  Filled 2021-09-05: qty 30, 30d supply, fill #0

## 2021-09-05 NOTE — Telephone Encounter (Signed)
Okay to switch back to lisinopril.  New prescription sent to pharmacy of record.

## 2021-09-05 NOTE — Telephone Encounter (Signed)
Patient states she is experiencing dizziness and body aches when taking losartan-hydrochlorothiazide (HYZAAR) 50-12.5 MG tablet  Patient states she wants to discontinue taking the medication and would like to request switching back to lisinopril  Pharmacy [id number] - [place], [place].

## 2021-09-06 NOTE — Telephone Encounter (Signed)
Called and spoke with pt about medication change.

## 2021-09-13 ENCOUNTER — Encounter: Payer: Self-pay | Admitting: Physical Medicine and Rehabilitation

## 2021-09-13 ENCOUNTER — Encounter
Payer: BC Managed Care – PPO | Attending: Physical Medicine and Rehabilitation | Admitting: Physical Medicine and Rehabilitation

## 2021-09-13 ENCOUNTER — Other Ambulatory Visit: Payer: Self-pay

## 2021-09-13 VITALS — BP 167/75 | HR 60 | Temp 98.5°F | Ht 64.0 in | Wt 244.0 lb

## 2021-09-13 DIAGNOSIS — Z6841 Body Mass Index (BMI) 40.0 and over, adult: Secondary | ICD-10-CM | POA: Diagnosis not present

## 2021-09-13 DIAGNOSIS — I10 Essential (primary) hypertension: Secondary | ICD-10-CM

## 2021-09-13 DIAGNOSIS — E66813 Obesity, class 3: Secondary | ICD-10-CM

## 2021-09-13 NOTE — Patient Instructions (Addendum)
[employer]   [place] [employer] [place]  Prediabetes:  -avoid sugar, bread, pasta, rice -avoid snacking -try to incorporate into your diet some of the following foods which are good for diabetes: 1) cinnamon- imitates effects of insulin, increasing glucose transport into cells ([place] cinnamon is best, least processed) 2) nuts- can slow down the blood sugar response of carbohydrate rich foods 3) oatmeal- contains and anti-inflammatory compound avenanthramide 4) whole-milk yogurt (best types are no sugar, [place] yogurt, or goat/sheep yogurt) 5) beans- high in protein, fiber, and vitamins, low glycemic index 6) broccoli- great source of vitamin A and C 7) quinoa- higher in protein and fiber than other grains 8) spinach- high in vitamin A, fiber, and protein 9) olive oil- reduces glucose levels, LDL, and triglycerides 10) salmon- excellent amount of omega-3-fatty acids 11) walnuts- rich in antioxidants 12) apples- high in fiber and quercetin 13) carrots- highly nutritious with low impact on blood sugar 14) eggs- improve HDL (good cholesterol), high in protein, keep you satiated 15) turmeric: improves blood sugars, cardiovascular disease, and protects kidney health 16) garlic: improves blood sugar, blood pressure, pain 17) tomatoes: highly nutritious with low impact on blood sugar

## 2021-09-13 NOTE — Progress Notes (Signed)
Subjective:    Patient ID: Shannon Garza, female    DOB: 1966-05-25, 56 y.o.   MRN: [id number]  HPI  Shannon Garza presents for follow-up of left sided weakness post-stroke  1) Weakness in left side -she feels weaker some times at night -she was concerned regarding whether she was having repeat stroke -she is doing well with walking. -she is still working on hand strengthening. -strength is improving  2) HTN: -BP is 165/63 -she continues to take her blood pressure medication -she recently started back on the lisinopril as she was tried on another blood pressure medication and it made her dizziness.   3) obesity: -she started walking yesterday -she has continued to lose a lot of weight! -she lost 7 lbs since her last visit on 12/1 -she has [place] nuts, cashew nuts.  -she is doing ok with walking, she has not recently since it has been cold.  -she tries not to eat after 6pm -she is thinking of doing a cleanse.  -she caught herself eating more during the holidays.  -she bout a health drink and she finds it calms her down  4) diabetes: has been 82 when she checked at home.  -she been very careful with her diet.  -she has been trying to eat about 2 meals per day.  -she eats Ezekial bread and peanut butter with bananas on top of it -sometimes she will eat oatmeal of grits. -she uses yellow grits.   5) fatigue:  -sister in law concerned about this and recommended that she postpone her return to work.   6) h/o stroke: -followed yo with Dr. Benita Stabile   Pain Inventory Average Pain 5 Pain Right Now 3 My pain is sharp, tingling, and aching  LOCATION OF PAIN   Right hand & fingers    BOWEL Number of stools per week: 7 Oral laxative use Yes  Type of laxative herbal laxative OTC Enema or suppository use No  History of colostomy No  Incontinent No   BLADDER Normal In and out cath, frequency n/a Able to self cath No  Bladder incontinence No  Frequent urination No   Leakage with coughing No  Difficulty starting stream No  Incomplete bladder emptying No    Mobility walk without assistance ability to climb steps?  yes do you drive?  yes Do you have any goals in this area?  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  yes  Neuro/Psych weakness  Prior Studies Any changes since last visit?  no  Physicians involved in your care Neurologist Dr. Pearlean Brownie   Family History  Problem Relation Age of Onset   Sleep apnea Neg Hx    Social History   Socioeconomic History   Marital status: Single    Spouse name: Not on file   Number of children: Not on file   Years of education: Not on file   Highest education level: Not on file  Occupational History   Not on file  Tobacco Use   Smoking status: Never   Smokeless tobacco: Never  Vaping Use   Vaping Use: Never used  Substance and Sexual Activity   Alcohol use: Not Currently   Drug use: Not Currently   Sexual activity: Not Currently  Other Topics Concern   Not on file  Social History Narrative   Lives with sister in law and brother, and sister in own home   Right Handed  Drinks no caffeine   Social Determinants of Corporate investment bankerHealth   Financial Resource Strain: Not on file  Food Insecurity: Not on file  Transportation Needs: Not on file  Physical Activity: Not on file  Stress: Not on file  Social Connections: Not on file   No past surgical history on file. Past Medical History:  Diagnosis Date   Diabetes mellitus without complication (HCC)    Hypertension    There were no vitals taken for this visit.  Opioid Risk Score:   Fall Risk Score:  `1  Depression screen PHQ 2/9  [facility] screen [facility] 04/19/2021 01/17/2021 12/24/2020 12/01/2020  Decreased Interest 0 0 0 2  Down, Depressed, Hopeless 0 0 0 0  PHQ - 2 Score 0 0 0 2  Altered sleeping - - - 0  Tired, decreased energy - - - 1  Change in appetite - - - 0   Feeling bad or failure about yourself  - - - 0  Trouble concentrating - - - 0  Moving slowly or fidgety/restless - - - 1  Suicidal thoughts - - - 0  PHQ-9 Score - - - 4    Review of Systems  Musculoskeletal:        Left hand pain & fingers pain  Neurological:  Positive for weakness.  All other systems reviewed and are negative.     Objective:   Physical Exam Gen: no distress, normal appearing, 244 lbs, 41.88 HEENT: oral mucosa pink and moist, NCAT Cardio: Reg rate Chest: normal effort, normal rate of breathing Abd: soft, non-distended Ext: no edema Psych: pleasant, normal affect Skin: intact Neuro: Alert and oriented x3. Musculoskeletal: 4/5 strength in left upper and lower extremity.     Assessment & Plan:  Shannon Garza is a 56 year old woman who presents for post-stroke follow-up.  1) Subcortical infarction -continue HEP -discussed weakness and prognosis -walk 15 minutes per day.  -may restart work on 8/11 -encouraged weight loss.   2) Prediabetes: -Reviewed HgbA1c  -avoid sugar, bread, pasta, rice -avoid snacking -try to incorporate into your diet some of the following foods which are good for diabetes: 1) cinnamon- imitates effects of insulin, increasing glucose transport into cells ([place] or [place] cinnamon is best, least processed) 2) nuts- can slow down the blood sugar response of carbohydrate rich foods 3) oatmeal- contains and anti-inflammatory compound avenanthramide 4) whole-milk yogurt (best types are no sugar, [place] yogurt, or goat/sheep yogurt) 5) beans- high in protein, fiber, and vitamins, low glycemic index 6) broccoli- great source of vitamin A and C 7) quinoa- higher in protein and fiber than other grains 8) spinach- high in vitamin A, fiber, and protein 9) olive oil- reduces glucose levels, LDL, and triglycerides 10) salmon- excellent amount of omega-3-fatty acids 11) walnuts- rich in antioxidants 12) apples- high in fiber and  quercetin 13) carrots- highly nutritious with low impact on blood sugar 14) eggs- improve HDL (good cholesterol), high in protein, keep you satiated 15) turmeric: improves blood sugars, cardiovascular disease, and protects kidney health 16) garlic: improves blood sugar, blood pressure, pain 17) tomatoes: highly nutritious with low impact on blood sugar   3) HTN: -BP is 167/75 today -Advised checking BP daily at home and logging results to bring into follow-up appointment with her PCP and myself. -recommended Bioptemizer's -Reviewed BP meds today.  -Advised regarding healthy foods that can help lower blood pressure and provided with a list: 1) citrus foods- high in vitamins and minerals 2) salmon and other fatty  fish - reduces inflammation and oxylipins 3) swiss chard (leafy green)- high level of nitrates 4) pumpkin seeds- one of the best natural sources of magnesium 5) Beans and lentils- high in fiber, magnesium, and potassium 6) Berries- high in flavonoids 7) Amaranth (whole grain, can be cooked similarly to rice and oats)- high in magnesium and fiber 8) Pistachios- even more effective at reducing BP than other nuts 9) Carrots- high in phenolic compounds that relax blood vessels and reduce inflammation 10) Celery- contain phthalides that relax tissues of arterial walls 11) Tomatoes- can also improve cholesterol and reduce risk of heart disease 12) Broccoli- good source of magnesium, calcium, and potassium 13) Greek yogurt: high in potassium and calcium 14) Herbs and spices: Celery seed, cilantro, saffron, lemongrass, black cumin, ginseng, cinnamon, cardamom, sweet basil, and ginger 15) Chia and flax seeds- also help to lower cholesterol and blood sugar 16) Beets- high levels of nitrates that relax blood vessels  17) spinach and bananas- high in potassium  -Provided lise of supplements that can help with hypertension:  1) magnesium: one high quality brand is Bioptemizers since it  contains all 7 types of magnesium, otherwise over the counter magnesium gluconate 400mg  is a good option 2) B vitamins 3) vitamin D 4) potassium 5) CoQ10 6) L-arginine 7) Vitamin C 8) Beetroot -Educated that goal BP is 120/80. -Made goal to incorporate some of the above foods into diet.    -Provided lise of supplements that can help with hypertension:  1) magnesium: one high quality brand is Bioptemizers since it contains all 7 types of magnesium, otherwise over the counter magnesium gluconate 400mg  is a good option 2) B vitamins 3) vitamin D 4) potassium 5) CoQ10 6) L-arginine 7) Vitamin C 8) Beetroot -Educated that goal BP is 120/80. -Made goal to incorporate some of the above foods into diet.  -Check Vitamin D, potassium, B12 folate,   4) Obesity: Weight is 244- lost 25 lbs!

## 2021-11-07 ENCOUNTER — Ambulatory Visit: Payer: BC Managed Care – PPO | Admitting: Physical Medicine and Rehabilitation

## 2021-11-24 ENCOUNTER — Ambulatory Visit: Payer: BC Managed Care – PPO | Admitting: Physical Medicine and Rehabilitation

## 2021-11-29 ENCOUNTER — Telehealth: Payer: Self-pay

## 2021-11-29 NOTE — Telephone Encounter (Signed)
? ?  Hypertension Outreach Note  ?Pharmacy Note ? ?Attempted to call patient to discuss BP ? ?No answer, unable to leave message ? ?Ellin Saba, PharmD ?Clinical Pharmacist, [facility]  ? ? ? ?

## 2021-12-27 ENCOUNTER — Telehealth: Payer: Self-pay

## 2021-12-27 NOTE — Telephone Encounter (Signed)
Hypertension Outreach Note  ?Pharmacy Note ?  ?Attempted to call patient to discuss BP ?  ?No answer, unable to leave message ?  ?Ellin Saba, PharmD ?Clinical Pharmacist, [facility]  ?

## 2022-04-03 ENCOUNTER — Encounter: Payer: BC Managed Care – PPO | Admitting: Physical Medicine and Rehabilitation

## 2022-04-30 IMAGING — CT CT ANGIO HEAD
1 of 11 series · 5 of 34 positions shown · IV contrast (omnipaque)
Comparison: 10/12/2020

CLINICAL DATA: Acute infarct on MRI

EXAM:
CT ANGIOGRAPHY HEAD AND NECK
TECHNIQUE: Multidetector CT imaging of the head and neck was performed using
the standard protocol during bolus administration of intravenous
contrast. Multiplanar CT image reconstructions and MIPs were
obtained to evaluate the vascular anatomy. Carotid stenosis
measurements (when applicable) are obtained utilizing NASCET
criteria, using the distal internal carotid diameter as the
denominator.
CONTRAST:  75mL OMNIPAQUE IOHEXOL 350 MG/ML SOLN

[Series 8: cta neck axial · axial · 0.36mm/px · z∈[-311,-103]mm · 5 of 318 slices shown]
[im 53/318  soft-tissue]
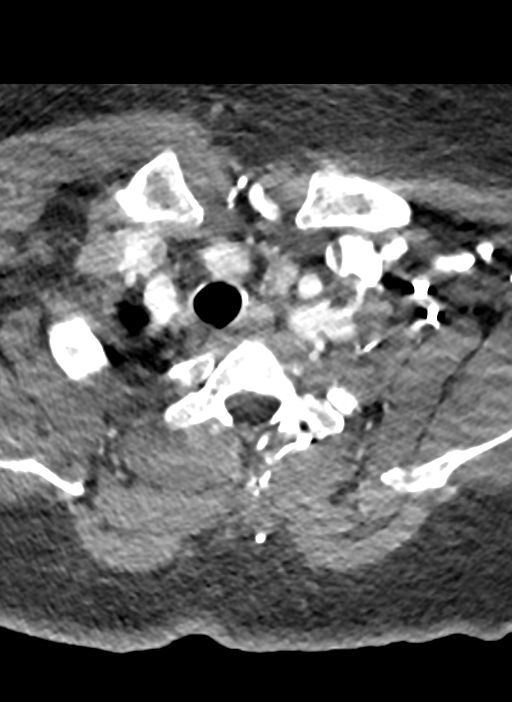
[im 106/318  bone]
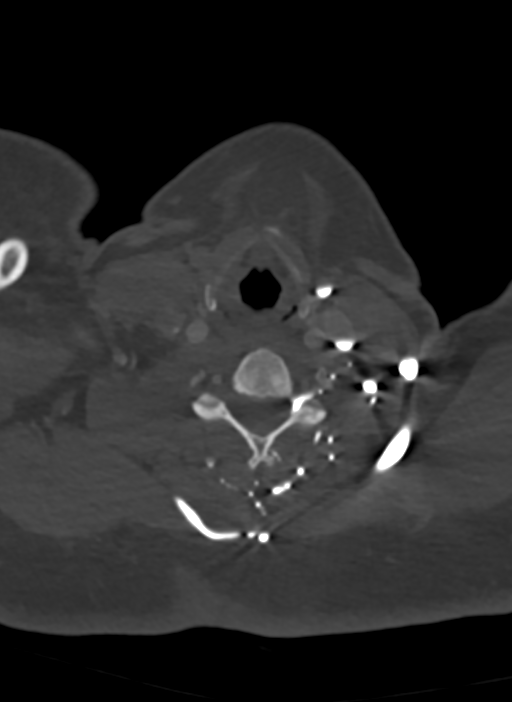
[im 159/318  soft-tissue]
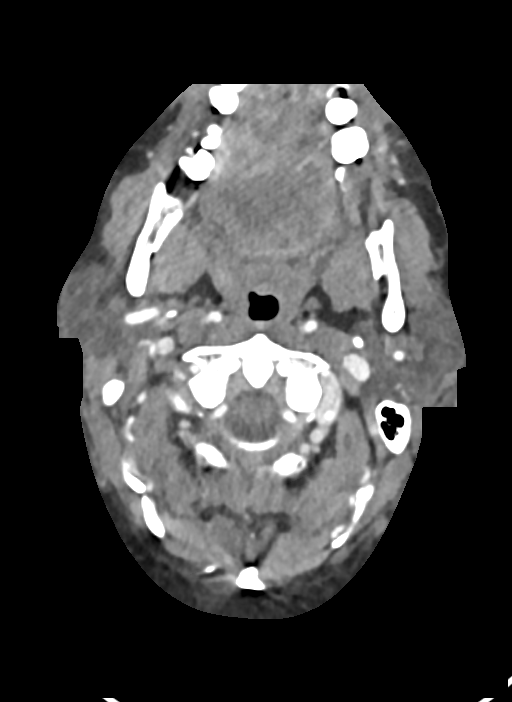
[im 212/318  bone]
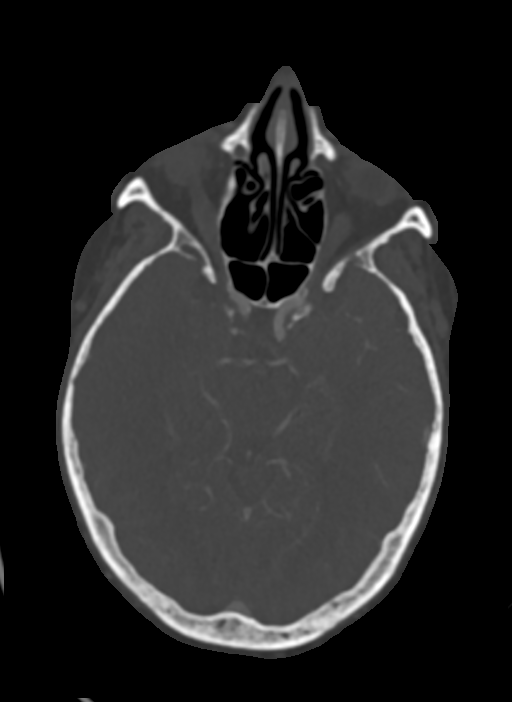
[im 265/318  soft-tissue]
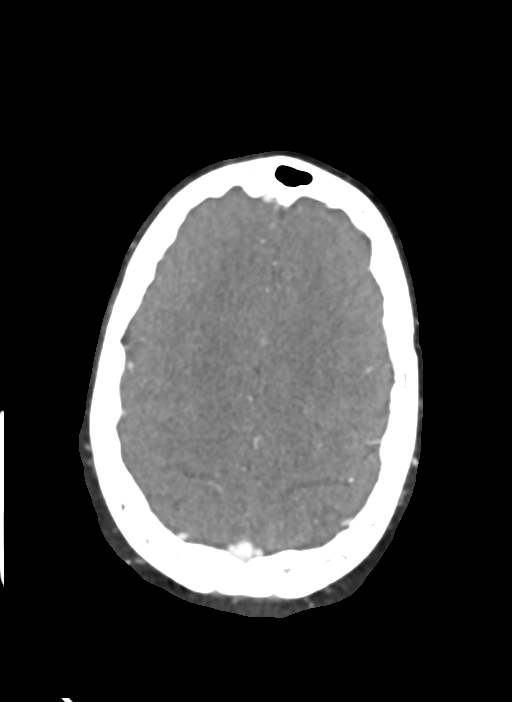

[5 of 34 positions shown; findings below may reference images not displayed]

FINDINGS: CT HEAD

Brain: There is no acute intracranial hemorrhage or mass effect.
There is subtle low-attenuation in the right corona radiata
corresponding to infarction on MRI. Chronic infarct of the left
basal ganglia and adjacent white matter. Additional patchy and
confluent areas of hypoattenuation in the supratentorial white
likely reflect age advanced chronic microvascular ischemic changes.
Ventricles are stable size. There is no extra-axial fluid
collection.

Vascular: No new findings.

Skull: Calvarium is unremarkable.

Sinuses/Orbits: No acute finding.

Other: None.

Review of the MIP images confirms the above findings

CTA NECK

Aortic arch: Great vessel origins are patent.

Right carotid system: Patent. No measurable stenosis at the ICA
origin.

Left carotid system: Patent. No measurable stenosis at the ICA
origin.

Vertebral arteries: Patent and codominant.

Skeleton: Mild cervical spine degenerative changes.

Other neck: No significant abnormality.

Upper chest: No apical lung mass.

Review of the MIP images confirms the above findings

CTA HEAD

Anterior circulation: Intracranial internal carotid arteries patent
with atherosclerotic irregularity primarily along the cavernous
segments, right greater left. Irregular noncalcified plaque is
present the floor of the posterior genu. There is a 3 x 2 mm
inferiorly directed aneurysm the distal supraclinoid right ICA.
Anterior cerebral arteries are patent. Hypoplastic right A1 ACA.
Middle cerebral arteries are patent. There is mild atherosclerotic
irregularity of distal branches.

Posterior circulation: Intracranial vertebral arteries, basilar
artery, and posterior cerebral arteries are patent.

Venous sinuses: Patent as allowed by contrast bolus timing.

Review of the MIP images confirms the above findings
IMPRESSION: No acute intracranial hemorrhage. Evolving acute infarction of the
corona radiata better seen on MRI.

No hemodynamically significant stenosis in the neck.

Right greater than left intracranial internal carotid
atherosclerosis. Irregular noncalcified plaque the floor of the
posterior genu.

Mild atherosclerotic irregularity of distal MCA branches
bilaterally.

3 x 2 mm aneurysm of the distal supraclinoid right ICA.

## 2022-05-26 ENCOUNTER — Encounter: Payer: 59 | Attending: Physical Medicine and Rehabilitation | Admitting: Physical Medicine and Rehabilitation

## 2022-05-26 VITALS — BP 164/72 | HR 60 | Ht 64.0 in | Wt 246.8 lb

## 2022-05-26 DIAGNOSIS — I10 Essential (primary) hypertension: Secondary | ICD-10-CM | POA: Insufficient documentation

## 2022-05-26 DIAGNOSIS — Z6841 Body Mass Index (BMI) 40.0 and over, adult: Secondary | ICD-10-CM | POA: Diagnosis not present

## 2022-05-26 DIAGNOSIS — R29898 Other symptoms and signs involving the musculoskeletal system: Secondary | ICD-10-CM | POA: Insufficient documentation

## 2022-05-26 MED ORDER — LISINOPRIL 40 MG PO TABS: 40.0000 mg | ORAL_TABLET | Freq: Every day | ORAL | 3 refills | Status: DC

## 2022-05-26 NOTE — Patient Instructions (Addendum)
Foods that can assist in weight loss: 1) leafy greens- high in fiber and nutrients 2) dark chocolate- improves metabolism (if prefer sweetened, best to sweeten with honey instead of sugar).  3) cruciferous vegetables- high in fiber and protein 4) full fat yogurt: high in healthy fat, protein, calcium, and probiotics 5) apples- high in a variety of phytochemicals 6) nuts- high in fiber and protein that increase feelings of fullness 7) grapefruit: rich in nutrients, antioxidants, and fiber (not to be taken with anticoagulation) 8) beans- high in protein and fiber 9) salmon- has high quality protein and healthy fats 10) green tea- rich in polyphenols 11) eggs- rich in choline and vitamin D 12) tuna- high protein, boosts metabolism 13) avocado- decreases visceral abdominal fat 14) chicken (pasture raised): high in protein and iron 15) blueberries- reduce abdominal fat and cholesterol 16) whole grains- decreases calories retained during digestion, speeds metabolism 17) chia seeds- curb appetite 18) chilies- increases fat metabolism  -Discussed supplements that can be used:  1) Metatrim 400mg  BID 30 minutes before breakfast and dinner  2) Sphaeranthus indicus and Garcinia mangostana (combinations of these and #1 can be found in capsicum and zychrome  3) green coffee bean extract 400mg  twice per day or [place] (african mango) 150 to 300mg  twice per day.   HTN: -BP is 164/72 -Advised checking BP daily at home and logging results to bring into follow-up appointment with PCP and myself. -Reviewed BP meds today.  -Advised regarding healthy foods that can help lower blood pressure and provided with a list: 1) citrus foods- high in vitamins and minerals 2) salmon and other fatty fish - reduces inflammation and oxylipins 3) swiss chard (leafy green)- high level of nitrates 4) pumpkin seeds- one of the best natural sources of magnesium 5) Beans and lentils- high in fiber, magnesium, and  potassium 6) Berries- high in flavonoids 7) Amaranth (whole grain, can be cooked similarly to rice and oats)- high in magnesium and fiber 8) Pistachios- even more effective at reducing BP than other nuts 9) Carrots- high in phenolic compounds that relax blood vessels and reduce inflammation 10) Celery- contain phthalides that relax tissues of arterial walls 11) Tomatoes- can also improve cholesterol and reduce risk of heart disease 12) Broccoli- good source of magnesium, calcium, and potassium 13) Greek yogurt: high in potassium and calcium 14) Herbs and spices: Celery seed, cilantro, saffron, lemongrass, black cumin, ginseng, cinnamon, cardamom, sweet basil, and ginger 15) Chia and flax seeds- also help to lower cholesterol and blood sugar 16) Beets- high levels of nitrates that relax blood vessels  17) spinach and bananas- high in potassium  -Provided lise of supplements that can help with hypertension:  1) magnesium: one high quality brand is Bioptemizers since it contains all 7 types of magnesium, otherwise over the counter magnesium gluconate 400mg  is a good option 2) B vitamins 3) vitamin D 4) potassium 5) CoQ10 6) L-arginine 7) Vitamin C 8) Beetroot -Educated that goal BP is 120/80. -Made goal to incorporate some of the above foods into diet.     Bioptemizer's [employer]

## 2022-05-26 NOTE — Progress Notes (Signed)
Subjective:    Patient ID: Shannon Garza, female    DOB: 05/09/1966, 56 y.o.   MRN: [id number]  HPI  Shannon Garza presents for follow-up of left sided weakness post-stroke  1) Weakness in left side -she feels weaker some times at night -she was concerned regarding whether she was having repeat stroke -she is doing well with walking. -she is still working on Geneticist, molecular. -strength is improving  2) HTN: -BP is 164/72 -she has not been checking it at home.  -she continues to take her blood pressure medication -she recently started back on the lisinopril as she was tried on another blood pressure medication and it made her dizziness.  -she is going to get beet root powder  3) obesity: -she started walking yesterday -she stopped eating a lot -she feels she needs to build up her fruit and vegetable intake -she ate pasta last night and this was ok -she eats salads -she finally got away from her sister-in-law who cooks unhealthy foods.  -she has continued to lose a lot of weight! -she lost 7 lbs since her last visit on 12/1 -she has [place] nuts, cashew nuts.  -she is doing ok with walking, she has not recently since it has been cold.  -she tries not to eat after 6pm -she is thinking of doing a cleanse.  -she caught herself eating more during the holidays.  -she bout a health drink and she finds it calms her down  4) diabetes: has been 82 when she checked at home.  -she been very careful with her diet.  -she has been trying to eat about 2 meals per day.  -she eats Ezekial bread and peanut butter with bananas on top of it -sometimes she will eat oatmeal of grits. -she uses yellow grits.   5) fatigue:  -sister in law concerned about this and recommended that she postpone her return to work.   6) h/o stroke: -followed yo with Dr. Benita Stabile  7) Bladder dysfunction -she feels she is urinating too much She feels she has too much flow at a time -when her feet swell she  goes more frequently -she is getting incontinent at times   Pain Inventory Average Pain 3 Pain Right Now 2 My pain is sharp, dull, and aching  LOCATION OF PAIN   Right hand & fingers    BOWEL Number of stools per week: 7 Oral laxative use No  Type of laxative herbal laxative  Enema or suppository use No  History of colostomy No  Incontinent No   BLADDER Normal In and out cath, frequency n/a Able to self cath No  Bladder incontinence Yes  Frequent urination No  Leakage with coughing Yes  Difficulty starting stream No  Incomplete bladder emptying No    Mobility walk without assistance how many minutes can you walk? 15-30 ability to climb steps?  yes do you drive?  yes Do you have any goals in this area?  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  yes  Neuro/Psych bladder control problems weakness  Prior Studies Any changes since last visit?  no  Physicians involved in your care Neurologist Dr. Pearlean Brownie   Family History  Problem Relation Age of Onset   Sleep apnea Neg Hx    Social History   Socioeconomic History   Marital status: Single    Spouse name: Not on file   Number of children:  Not on file   Years of education: Not on file   Highest education level: Not on file  Occupational History   Not on file  Tobacco Use   Smoking status: Never   Smokeless tobacco: Never  Vaping Use   Vaping Use: Never used  Substance and Sexual Activity   Alcohol use: Not Currently   Drug use: Not Currently   Sexual activity: Not Currently  Other Topics Concern   Not on file  Social History Narrative   Lives with sister in law and brother, and sister in own home   Right Handed   Drinks no caffeine   Social Determinants of Health   Financial Resource Strain: Not on file  Food Insecurity: Not on file  Transportation Needs: Not on file  Physical Activity: Not on file  Stress: Not on file   Social Connections: Not on file   No past surgical history on file. Past Medical History:  Diagnosis Date   Diabetes mellitus without complication (HCC)    Hypertension    BP (!) 164/72   Pulse 60   Ht 5\' 4"  (1.626 m)   Wt 246 lb 12.8 oz (111.9 kg)   SpO2 100%   BMI 42.36 kg/m   Opioid Risk Score:   Fall Risk Score:  `1  Depression screen [facility] 2/9     09/13/2021   10:54 AM 04/19/2021    9:25 AM 01/17/2021   11:04 AM 12/24/2020   12:52 PM 12/01/2020   11:25 AM  Depression screen PHQ 2/9  Decreased Interest 0 0 0 0 2  Down, Depressed, Hopeless 0 0 0 0 0  PHQ - 2 Score 0 0 0 0 2  Altered sleeping     0  Tired, decreased energy     1  Change in appetite     0  Feeling bad or failure about yourself      0  Trouble concentrating     0  Moving slowly or fidgety/restless     1  Suicidal thoughts     0  PHQ-9 Score     4    Review of Systems  Musculoskeletal:        Left hand pain & fingers pain  Neurological:  Positive for weakness.  All other systems reviewed and are negative.     Objective:   Physical Exam Gen: no distress, normal appearing, 246 lbs, BMO 42.36 HEENT: oral mucosa pink and moist, NCAT Cardio: Reg rate Chest: normal effort, normal rate of breathing Abd: soft, non-distended Ext: no edema Psych: pleasant, normal affect Skin: intact Neuro: Alert and oriented x3. Musculoskeletal: 4/5 strength in left upper and lower extremity.     Assessment & Plan:  Shannon Garza is a 56 year old woman who presents for post-stroke follow-up.  1) Subcortical infarction -continue HEP -discussed weakness and prognosis -walk 15 minutes per day.  -may restart work on 8/11 -encouraged weight loss.  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   2) Prediabetes: -Reviewed HgbA1c  -will recheck next time -avoid sugar, bread, pasta, rice -avoid snacking -try to incorporate into your diet some of the following foods  which are good for diabetes: 1) cinnamon- imitates effects of insulin, increasing glucose transport into cells ([place] or [place] cinnamon is best, least processed) 2) nuts- can slow down the blood sugar response of carbohydrate rich foods 3) oatmeal- contains and anti-inflammatory compound avenanthramide 4) whole-milk yogurt (best types  are no sugar, [place] yogurt, or goat/sheep yogurt) 5) beans- high in protein, fiber, and vitamins, low glycemic index 6) broccoli- great source of vitamin A and C 7) quinoa- higher in protein and fiber than other grains 8) spinach- high in vitamin A, fiber, and protein 9) olive oil- reduces glucose levels, LDL, and triglycerides 10) salmon- excellent amount of omega-3-fatty acids 11) walnuts- rich in antioxidants 12) apples- high in fiber and quercetin 13) carrots- highly nutritious with low impact on blood sugar 14) eggs- improve HDL (good cholesterol), high in protein, keep you satiated 15) turmeric: improves blood sugars, cardiovascular disease, and protects kidney health 16) garlic: improves blood sugar, blood pressure, pain 17) tomatoes: highly nutritious with low impact on blood sugar   3) HTN: -BP is 164/72 -Advised checking BP daily at home and logging results to bring into follow-up appointment with PCP and myself. Refilled Lisinopril -Reviewed BP meds today.  -Advised regarding healthy foods that can help lower blood pressure and provided with a list: 1) citrus foods- high in vitamins and minerals 2) salmon and other fatty fish - reduces inflammation and oxylipins 3) swiss chard (leafy green)- high level of nitrates 4) pumpkin seeds- one of the best natural sources of magnesium 5) Beans and lentils- high in fiber, magnesium, and potassium 6) Berries- high in flavonoids 7) Amaranth (whole grain, can be cooked similarly to rice and oats)- high in magnesium and fiber 8) Pistachios- even more effective at reducing BP than other nuts 9)  Carrots- high in phenolic compounds that relax blood vessels and reduce inflammation 10) Celery- contain phthalides that relax tissues of arterial walls 11) Tomatoes- can also improve cholesterol and reduce risk of heart disease 12) Broccoli- good source of magnesium, calcium, and potassium 13) Greek yogurt: high in potassium and calcium 14) Herbs and spices: Celery seed, cilantro, saffron, lemongrass, black cumin, ginseng, cinnamon, cardamom, sweet basil, and ginger 15) Chia and flax seeds- also help to lower cholesterol and blood sugar 16) Beets- high levels of nitrates that relax blood vessels  17) spinach and bananas- high in potassium  -Provided lise of supplements that can help with hypertension:  1) magnesium: one high quality brand is Bioptemizers since it contains all 7 types of magnesium, otherwise over the counter magnesium gluconate 400mg  is a good option 2) B vitamins 3) vitamin D 4) potassium 5) CoQ10 6) L-arginine 7) Vitamin C 8) Beetroot -Educated that goal BP is 120/80. -Made goal to incorporate some of the above foods into diet.    4) Obesity: Weight is 244- lost 23 lbs! -Educated regarding health benefits of weight loss- for pain, general health, chronic disease prevention, immune health, mental health.  -Will monitor weight every visit.  -Consider [place] tea daily.  -Discussed the benefits of intermittent fasting. -Discussed foods that can assist in weight loss: 1) leafy greens- high in fiber and nutrients 2) dark chocolate- improves metabolism (if prefer sweetened, best to sweeten with honey instead of sugar).  3) cruciferous vegetables- high in fiber and protein 4) full fat yogurt: high in healthy fat, protein, calcium, and probiotics 5) apples- high in a variety of phytochemicals 6) nuts- high in fiber and protein that increase feelings of fullness 7) grapefruit: rich in nutrients, antioxidants, and fiber (not to be taken with anticoagulation) 8) beans-  high in protein and fiber 9) salmon- has high quality protein and healthy fats 10) green tea- rich in polyphenols 11) eggs- rich in choline and vitamin D 12) tuna- high protein, boosts metabolism  13) avocado- decreases visceral abdominal fat 14) chicken (pasture raised): high in protein and iron 15) blueberries- reduce abdominal fat and cholesterol 16) whole grains- decreases calories retained during digestion, speeds metabolism 17) chia seeds- curb appetite 18) chilies- increases fat metabolism  -Discussed supplements that can be used:  1) Metatrim 400mg  BID 30 minutes before breakfast and dinner  2) Sphaeranthus indicus and Garcinia mangostana (combinations of these and #1 can be found in capsicum and zychrome  3) green coffee bean extract 400mg  twice per day or [place] (african mango) 150 to 300mg  twice per day.  5) Bladder dysfunction -continue slippery elm since appears to be helping

## 2022-06-07 ENCOUNTER — Ambulatory Visit
Payer: 59 | Attending: Physical Medicine and Rehabilitation | Admitting: Rehabilitative and Restorative Service Providers"

## 2022-06-07 ENCOUNTER — Encounter: Payer: Self-pay | Admitting: Rehabilitative and Restorative Service Providers"

## 2022-06-07 ENCOUNTER — Other Ambulatory Visit: Payer: Self-pay

## 2022-06-07 DIAGNOSIS — M25642 Stiffness of left hand, not elsewhere classified: Secondary | ICD-10-CM | POA: Diagnosis present

## 2022-06-07 DIAGNOSIS — R41840 Attention and concentration deficit: Secondary | ICD-10-CM | POA: Insufficient documentation

## 2022-06-07 DIAGNOSIS — M25612 Stiffness of left shoulder, not elsewhere classified: Secondary | ICD-10-CM | POA: Diagnosis present

## 2022-06-07 DIAGNOSIS — M25512 Pain in left shoulder: Secondary | ICD-10-CM | POA: Insufficient documentation

## 2022-06-07 DIAGNOSIS — I69354 Hemiplegia and hemiparesis following cerebral infarction affecting left non-dominant side: Secondary | ICD-10-CM | POA: Diagnosis present

## 2022-06-07 DIAGNOSIS — M6281 Muscle weakness (generalized): Secondary | ICD-10-CM | POA: Diagnosis present

## 2022-06-07 DIAGNOSIS — R278 Other lack of coordination: Secondary | ICD-10-CM | POA: Diagnosis present

## 2022-06-07 DIAGNOSIS — G8929 Other chronic pain: Secondary | ICD-10-CM | POA: Diagnosis present

## 2022-06-07 NOTE — Therapy (Signed)
OUTPATIENT OCCUPATIONAL THERAPY NEURO EVALUATION  Patient Name: Shannon Garza MRN: [id number] DOB:[birth year], 56 y.o., female Today's Date: 06/07/2022  PCP: Horald Pollen, MD REFERRING PROVIDER: Izora Ribas, MD   OT End of Session - 06/07/22 1404     Visit Number 1    Number of Visits 6    Date for OT Re-Evaluation 07/21/22    Authorization Type [employer]    OT Start Time 1404    OT Stop Time 1442    OT Time Calculation (min) 38 min    Activity Tolerance Patient tolerated treatment well;No increased pain;Patient limited by fatigue    Behavior During Therapy [facility] for tasks assessed/performed             Past Medical History:  Diagnosis Date   Diabetes mellitus without complication ([place])    Hypertension    History reviewed. No pertinent surgical history. Patient Active Problem List   Diagnosis Date Noted   History of stroke 01/17/2021   Dyslipidemia 01/17/2021   CVA (cerebral vascular accident) ([place]) 10/18/2020   Primary hypertension    Left arm weakness    Left leg weakness    Subcortical infarction ([place]) 10/12/2020    ONSET DATE: CVA 10/12/20 - chronic   REFERRING DIAG: R29.898 (ICD-10-CM) - Left hand weakness  THERAPY DIAG:  Stiffness of left hand, not elsewhere classified  Muscle weakness (generalized)  Other lack of coordination  Rationale for Evaluation and Treatment Rehabilitation  SUBJECTIVE:   SUBJECTIVE STATEMENT: She states her Lt ring finger feels more stiff and slow. She states difficulty with IADLs like sewing for leisure, manipulating pills and small objects, etc. She is not working, living with her sister.   PERTINENT HISTORY: Chronic stroke from Feb 2022. She was seen here in May 2022, and just finished course of 4 PT visits in Sept 2023 for L shoulder pain.  Per MD: "Left hand weakness"   PRECAUTIONS: None  WEIGHT BEARING RESTRICTIONS No  PAIN:  Are you having pain? No Rting: 0/10 at rest now  FALLS: Has patient  fallen in last 6 months? No  LIVING ENVIRONMENT: Lives with:  with her sister Has following equipment at home: None  PLOF: Independent with basic ADLs, Independent with household mobility without device, Independent with community mobility without device, Independent with gait, Independent with transfers, and Independent with IADLs (cooking, cleaning, driving)   PATIENT GOALS "Get left hand moving and working better."    OBJECTIVE: (All objective assessments below are from initial evaluation on: 06/07/22 unless otherwise specified.)    BASELINE HAND DOMINANCE: Right    ADLs: Overall ADLs: No complaints, states able to do all herself without her sister's help, no equipment.   IADLs: Med management and leisure skills seem to be biggest complaint today, but likely receives assist from sister for finances, shopping, etc., and she does not drive.   MOBILITY STATUS: Independent  POSTURE COMMENTS:  No Significant postural limitations   ACTIVITY TOLERANCE: Activity tolerance: slightly decreased in Lt UE compared to Rt UE   FUNCTIONAL OUTCOME MEASURES: Eval: Patient Specific Functional Scale: 6 (sewing tasks, hold/manipulate pills, play/hold cards)  (Higher Score  =  Better Ability for the Selected Tasks)      UPPER EXTREMITY ROM    Eval: Lt Arm:   Pt is able to make a full fist.  Pt can fully oppose the thumb to the small finger.   AROM Left eval  Shoulder flexion 150* b/l   Shoulder abduction 150* b/l   Elbow  flexion Full b/l  Elbow extension Full b/l  Forearm supination Equal 80* b/l  Forearm pronation Equal 80* b/l  Wrist flexion 60 (opposite 60)    Wrist extension 50 (opposite 55*)  (Blank rows = not tested)    UPPER EXTREMITY MMT:     MMT Left eval  Elbow flexion 5/5  Elbow extension 5/5  Forearm supination 5/5  Forearm pronation 5/5  Wrist flexion 5/5  Wrist extension 4/5  (Blank rows = not tested)  HAND FUNCTION: Eval: Pt with observed weakness in  affected hand.  Grip strength: Right: 72 lbs; Left: 55 lbs   COORDINATION: Eval: Pt with observed coordination deficits in affected UE. [facility] Peg Test: Lt hand 32 sec  (scored 53 sec on 01/06/21)   SENSATION: Eval: does describe warm sensation when Lt hand touched but no lack of LT sensation.   EDEMA:  Eval: none   MUSCLE TONE:   Eval: LUE: Within functional limits  COGNITION: Eval: Overall cognitive status: Within functional limits for tasks assessed, however she does appear "childlike" at times, mildly regressed perhaps, needing slower and repeated explanations that are somewhat simplified.  PERCEPTION:  Eval: WFL proprioception and stereognosis   PRAXIS:  Eval: WFL  OBSERVATIONS:  Eval: mainly seems to have FMS coordination deficits and issues with fast twitch accurate motion, etc.   TODAY'S TREATMENT:  Post-evaluation treatment:  She was edu on the following HEP to help with FMS and wrist tightness. She performs back all to OT with some difficulty and dropping pen with manipulations ("pencil tricks" - twirl, flip, shift, translate palm to fingers).   Exercises - Wrist Prayer Stretch  - 3- 4 x daily - 3 reps - 15 sec hold -In-Hand Manipulations: rotate, flip, translation, shift - x10 each   PATIENT EDUCATION: Education details: See tx section above for details  Person educated: Patient  Education method: Explanation, Demonstration, Verbal cues, and Handouts Education comprehension: verbalized understanding, returned demonstration, verbal cues required, and needs further education   HOME EXERCISE PROGRAM: Access Code: [email or web address] URL: [email or web address] Date: 06/07/2022 Prepared by: Benito Mccreedy      GOALS: Goals reviewed with patient? Yes  SHORT TERM GOALS: Target date: 06/23/22  Pt will demo/state understanding of initial home exercise program (HEP) to improve function, pain, and independence.   Baseline: Needs a plan for rehabilitation   Goal status: INITIAL    LONG TERM GOALS: Target date: 07/21/22  Pt will improve functional ability by decreased impairment per  PSFS assessment to 8 or better, for better quality of life. Baseline: 6 Goal status: INITIAL  2.  Pt will improve A/ROM in Lt wist ext to at least 60*, to have prerequisite/functional motion for tasks like reach and grasp.  Baseline: 50* Goal status: INITIAL  3.  Pt will improve endurance in Lt wrist/hand to perform resistive activities for at least 20 mins without breaks to have increased functional ability to carry out selfcare and higher-level homecare tasks with no difficulty Baseline: 5-10 mins Goal status: INITIAL  4.  Pt will improve coordination skills in Lt hand, as seen by increasing score on 9HPT testing to at least 27sec to have increased functional ability to carry out fine motor tasks (fasteners, etc.) and more complex, coordinated IADLs (meal prep, sports, etc.).  Baseline: 32 sec Goal status: INITIAL    ASSESSMENT:  CLINICAL IMPRESSION: Patient is a 56 y.o. female who was seen today for occupational therapy evaluation for Lt hand weakness/poor coordination. She presents mostly as very slightly  stiff in hand and overall sluggish motion and coordination.  She will benefit from course of OP OT to increase ability for function.   PERFORMANCE DEFICITS in functional skills including IADLs, coordination, dexterity, sensation, strength, flexibility, FMC, endurance, and UE functional use, cognitive skills including memory and problem solving, and psychosocial skills including coping strategies and habits.   IMPAIRMENTS are limiting patient from IADLs and leisure.   COMORBIDITIES has co-morbidities such as HTN, obesity, and others  that affects occupational performance. Patient will benefit from skilled OT to address above impairments and improve overall function.  MODIFICATION OR ASSISTANCE TO COMPLETE EVALUATION: No modification of tasks or assist  necessary to complete an evaluation.  OT OCCUPATIONAL PROFILE AND HISTORY: Problem focused assessment: Including review of records relating to presenting problem.  CLINICAL DECISION MAKING: LOW - limited treatment options, no task modification necessary  REHAB POTENTIAL: Excellent  EVALUATION COMPLEXITY: Low    PLAN: OT FREQUENCY: 1x/week  OT DURATION: 6 weeks (however 4 weeks therapy (~ 4 additional visits) is anticipated need)   PLANNED INTERVENTIONS: therapeutic exercise, therapeutic activity, neuromuscular re-education, manual therapy, fluidotherapy, moist heat, contrast bath, patient/family education, and coping strategies training  RECOMMENDED OTHER SERVICES: none now   CONSULTED AND AGREED WITH PLAN OF CARE: Patient  PLAN FOR NEXT SESSION:  Go over initial HEP for in-hand manipulations ("pencil tricks" - twirl, flip, shift, translate palm to fingers), and prayer stretches for wrist tightness. Challenge with FMS and coordinated activities. Add to HEP as appropriate.    Benito Mccreedy, OTR/L, CHT 06/07/2022, 4:00 PM

## 2022-06-20 NOTE — Therapy (Signed)
OUTPATIENT OCCUPATIONAL THERAPY TREATMENT NOTE  Patient Name: Shannon Garza MRN: [id number] DOB:10-17-1965, 56 y.o., female Today's Date: 06/21/2022  PCP: Horald Pollen, MD REFERRING PROVIDER: Izora Ribas, MD   OT End of Session - 06/21/22 1535     Visit Number 2    Number of Visits 6    Date for OT Re-Evaluation 07/21/22    Authorization Type [employer]    OT Start Time 1534    OT Stop Time 1614    OT Time Calculation (min) 40 min    Activity Tolerance Patient tolerated treatment well;No increased pain;Patient limited by fatigue    Behavior During Therapy [facility] for tasks assessed/performed              Past Medical History:  Diagnosis Date   Diabetes mellitus without complication ([place])    Hypertension    History reviewed. No pertinent surgical history. Patient Active Problem List   Diagnosis Date Noted   History of stroke 01/17/2021   Dyslipidemia 01/17/2021   CVA (cerebral vascular accident) ([place]) 10/18/2020   Primary hypertension    Left arm weakness    Left leg weakness    Subcortical infarction ([place]) 10/12/2020    ONSET DATE: CVA 10/12/20 - chronic   REFERRING DIAG: R29.898 (ICD-10-CM) - Left hand weakness  THERAPY DIAG:  Stiffness of left hand, not elsewhere classified  Stiffness of left shoulder, not elsewhere classified  Chronic left shoulder pain  Muscle weakness (generalized)  Other lack of coordination  Hemiplegia and hemiparesis following cerebral infarction affecting left non-dominant side (HCC)  Attention and concentration deficit  Rationale for Evaluation and Treatment Rehabilitation  PERTINENT HISTORY: Chronic stroke from Feb 2022. She was seen here in May 2022, and just finished course of 4 PT visits in Sept 2023 for L shoulder pain.  Per MD: "Left hand weakness"   PRECAUTIONS: None  WEIGHT BEARING RESTRICTIONS No   SUBJECTIVE:   SUBJECTIVE STATEMENT: She states she has been practicing sewing.   PAIN:  Are you  having pain? No  Rting: 0/10 at rest now   PLOF: Independent with basic ADLs, Independent with household mobility without device, Independent with community mobility without device, Independent with gait, Independent with transfers, and Independent with IADLs (cooking, cleaning, driving)   PATIENT GOALS "Get left hand moving and working better."    OBJECTIVE: (All objective assessments below are from initial evaluation on: 06/07/22 unless otherwise specified.)    BASELINE HAND DOMINANCE: Right    ADLs: Overall ADLs: No complaints, states able to do all herself without her sister's help, no equipment.   IADLs: Med management and leisure skills seem to be biggest complaint today, but likely receives assist from sister for finances, shopping, etc., and she does not drive.    ACTIVITY TOLERANCE: Activity tolerance: slightly decreased in Lt UE compared to Rt UE   FUNCTIONAL OUTCOME MEASURES: Eval: Patient Specific Functional Scale: 6 (sewing tasks, hold/manipulate pills, play/hold cards)  (Higher Score  =  Better Ability for the Selected Tasks)      UPPER EXTREMITY ROM    Eval: Lt Arm:   Pt is able to make a full fist.  Pt can fully oppose the thumb to the small finger.   AROM Left eval  Shoulder flexion 150* b/l   Shoulder abduction 150* b/l   Elbow flexion Full b/l  Elbow extension Full b/l  Forearm supination Equal 80* b/l  Forearm pronation Equal 80* b/l  Wrist flexion 60 (opposite 60)    Wrist  extension 50 (opposite 55*)  (Blank rows = not tested)    UPPER EXTREMITY MMT:     MMT Left eval  Elbow flexion 5/5  Elbow extension 5/5  Forearm supination 5/5  Forearm pronation 5/5  Wrist flexion 5/5  Wrist extension 4/5  (Blank rows = not tested)  HAND FUNCTION: Eval: Pt with observed weakness in affected hand.  Grip strength: Right: 72 lbs; Left: 55 lbs   COORDINATION: Eval: Pt with observed coordination deficits in affected UE. [facility] Peg Test: Lt hand 32 sec   (scored 53 sec on 01/06/21)   SENSATION: Eval: does describe warm sensation when Lt hand touched but no lack of LT sensation.   EDEMA:  Eval: none   MUSCLE TONE:   Eval: LUE: Within functional limits  COGNITION: Eval: Overall cognitive status: Within functional limits for tasks assessed, however she does appear "childlike" at times, mildly regressed perhaps, needing slower and repeated explanations that are somewhat simplified.  OBSERVATIONS:  Eval: mainly seems to have FMS coordination deficits and issues with fast twitch accurate motion, etc.   TODAY'S TREATMENT:  06/21/22:  OT reviews initial HEP for in-hand manipulations ("pencil tricks" - twirl, flip, shift, translate palm to fingers), and she drops several times but seems to be doing better now.  She states forgetting them, so it's likely sh hasn't been doing these 3 x day as asked.  Next we review prayer stretches for wrist tightness, which she does fine with, but has tightness. For the rest of the session, OT challenges her with functional activities for FMS and coordination (like pinching/moving clothespins, stacking small pieces, [employer] Pegboard, mini-connect 4).  She states these are a challenge. Needs cues and coaching at times.  Encouraged to keep doing FMS at home. OT also recommends to squeeze stress-ball fast and slow at home for strength and coordination. She does 5 fast squeezes followed by 2 prolonged squeezes.    PATIENT EDUCATION: Education details: See tx section above for details  Person educated: Patient  Education method: Explanation, Demonstration, Verbal cues, and Handouts Education comprehension: verbalized understanding, returned demonstration, verbal cues required, and needs further education   HOME EXERCISE PROGRAM: Access Code: [email or web address] URL: [email or web address]   GOALS: Goals reviewed with patient? Yes  SHORT TERM GOALS: Target date: 06/23/22  Pt will demo/state understanding of  initial home exercise program (HEP) to improve function, pain, and independence.   Baseline: Needs a plan for rehabilitation  Goal status: 06/21/22: Met    LONG TERM GOALS: Target date: 07/21/22  Pt will improve functional ability by decreased impairment per  PSFS assessment to 8 or better, for better quality of life. Baseline: 6 Goal status: INITIAL  2.  Pt will improve A/ROM in Lt wist ext to at least 60*, to have prerequisite/functional motion for tasks like reach and grasp.  Baseline: 50* Goal status: INITIAL  3.  Pt will improve endurance in Lt wrist/hand to perform resistive activities for at least 20 mins without breaks to have increased functional ability to carry out selfcare and higher-level homecare tasks with no difficulty Baseline: 5-10 mins Goal status: INITIAL  4.  Pt will improve coordination skills in Lt hand, as seen by increasing score on 9HPT testing to at least 27sec to have increased functional ability to carry out fine motor tasks (fasteners, etc.) and more complex, coordinated IADLs (meal prep, sports, etc.).  Baseline: 32 sec Goal status: INITIAL   ASSESSMENT:  CLINICAL IMPRESSION: 06/21/22: She is improving her FMS and coordination and  has increased HEP repertoire.    Eval: Patient is a 56 y.o. female who was seen today for occupational therapy evaluation for Lt hand weakness/poor coordination. She presents mostly as very slightly stiff in hand and overall sluggish motion and coordination.  She will benefit from course of OP OT to increase ability for function.     PLAN: OT FREQUENCY: 1x/week  OT DURATION: 6 weeks (however 4 weeks therapy (~ 4 additional visits) is anticipated need)   PLANNED INTERVENTIONS: therapeutic exercise, therapeutic activity, neuromuscular re-education, manual therapy, fluidotherapy, moist heat, contrast bath, patient/family education, and coping strategies training  RECOMMENDED OTHER SERVICES: none now   CONSULTED AND  AGREED WITH PLAN OF CARE: Patient  PLAN FOR NEXT SESSION:  Continue to challenge FMS/coord and work toward goals.    Benito Mccreedy, OTR/L, CHT 06/21/2022, 4:16 PM

## 2022-06-21 ENCOUNTER — Ambulatory Visit: Payer: 59 | Admitting: Rehabilitative and Restorative Service Providers"

## 2022-06-21 ENCOUNTER — Encounter: Payer: Self-pay | Admitting: Rehabilitative and Restorative Service Providers"

## 2022-06-21 DIAGNOSIS — G8929 Other chronic pain: Secondary | ICD-10-CM

## 2022-06-21 DIAGNOSIS — R278 Other lack of coordination: Secondary | ICD-10-CM

## 2022-06-21 DIAGNOSIS — M25612 Stiffness of left shoulder, not elsewhere classified: Secondary | ICD-10-CM

## 2022-06-21 DIAGNOSIS — M6281 Muscle weakness (generalized): Secondary | ICD-10-CM

## 2022-06-21 DIAGNOSIS — R41840 Attention and concentration deficit: Secondary | ICD-10-CM

## 2022-06-21 DIAGNOSIS — I69354 Hemiplegia and hemiparesis following cerebral infarction affecting left non-dominant side: Secondary | ICD-10-CM

## 2022-06-21 DIAGNOSIS — M25642 Stiffness of left hand, not elsewhere classified: Secondary | ICD-10-CM

## 2022-07-05 ENCOUNTER — Ambulatory Visit: Payer: 59 | Attending: Physical Medicine and Rehabilitation | Admitting: Occupational Therapy

## 2022-07-05 ENCOUNTER — Encounter: Payer: Self-pay | Admitting: Occupational Therapy

## 2022-07-05 VITALS — BP 181/88

## 2022-07-05 DIAGNOSIS — R2681 Unsteadiness on feet: Secondary | ICD-10-CM | POA: Insufficient documentation

## 2022-07-05 DIAGNOSIS — R41840 Attention and concentration deficit: Secondary | ICD-10-CM | POA: Diagnosis present

## 2022-07-05 DIAGNOSIS — G8929 Other chronic pain: Secondary | ICD-10-CM | POA: Diagnosis present

## 2022-07-05 DIAGNOSIS — R278 Other lack of coordination: Secondary | ICD-10-CM | POA: Diagnosis present

## 2022-07-05 DIAGNOSIS — M25612 Stiffness of left shoulder, not elsewhere classified: Secondary | ICD-10-CM | POA: Insufficient documentation

## 2022-07-05 DIAGNOSIS — M25512 Pain in left shoulder: Secondary | ICD-10-CM | POA: Insufficient documentation

## 2022-07-05 DIAGNOSIS — I69354 Hemiplegia and hemiparesis following cerebral infarction affecting left non-dominant side: Secondary | ICD-10-CM | POA: Diagnosis present

## 2022-07-05 DIAGNOSIS — M25642 Stiffness of left hand, not elsewhere classified: Secondary | ICD-10-CM | POA: Insufficient documentation

## 2022-07-05 DIAGNOSIS — M6281 Muscle weakness (generalized): Secondary | ICD-10-CM | POA: Diagnosis present

## 2022-07-05 DIAGNOSIS — R208 Other disturbances of skin sensation: Secondary | ICD-10-CM | POA: Diagnosis present

## 2022-07-05 NOTE — Patient Instructions (Signed)
1. Grip Strengthening (Resistive Putty)   Squeeze putty using thumb and all fingers. Repeat _20___ times. Do __2__ sessions per day.   2. Roll putty into tube on table and pinch between first two fingers and thumb x 10 reps. Do 2 sessions per day     Copyright  VHI. All rights reserved.     

## 2022-07-05 NOTE — Therapy (Addendum)
OUTPATIENT OCCUPATIONAL THERAPY TREATMENT NOTE  Patient Name: Shannon Garza MRN: [id number] DOB:07/07/1966, 56 y.o., female Today's Date: 07/05/2022  PCP: Sagardia, Miguel Jose, MD REFERRING PROVIDER: Raulkar, Krutika P, MD   OT End of Session - 07/05/22 1453     Visit Number 3    Number of Visits 6    Date for OT Re-Evaluation 07/21/22    Authorization Type UHC    OT Start Time 1451    OT Stop Time 1529    OT Time Calculation (min) 38 min    Activity Tolerance Patient tolerated treatment well;No increased pain;Patient limited by fatigue    Behavior During Therapy [facility] for tasks assessed/performed               Past Medical History:  Diagnosis Date   Diabetes mellitus without complication (HCC)    Hypertension    No past surgical history on file. Patient Active Problem List   Diagnosis Date Noted   History of stroke 01/17/2021   Dyslipidemia 01/17/2021   CVA (cerebral vascular accident) (HCC) 10/18/2020   Primary hypertension    Left arm weakness    Left leg weakness    Subcortical infarction (HCC) 10/12/2020    ONSET DATE: CVA 10/12/20 - chronic   REFERRING DIAG: R29.898 (ICD-10-CM) - Left hand weakness  THERAPY DIAG:  Stiffness of left hand, not elsewhere classified  Stiffness of left shoulder, not elsewhere classified  Chronic left shoulder pain  Other lack of coordination  Hemiplegia and hemiparesis following cerebral infarction affecting left non-dominant side (HCC)  Attention and concentration deficit  Rationale for Evaluation and Treatment Rehabilitation  PERTINENT HISTORY: Chronic stroke from Feb 2022. She was seen here in May 2022, and just finished course of 4 PT visits in Sept 2023 for L shoulder pain.  Per MD: "Left hand weakness"   PRECAUTIONS: None  WEIGHT BEARING RESTRICTIONS No   SUBJECTIVE:   SUBJECTIVE STATEMENT: She states she worked some today  PAIN:  Are you having pain? No  Rting: 0/10 at rest now   PLOF:  Independent with basic ADLs, Independent with household mobility without device, Independent with community mobility without device, Independent with gait, Independent with transfers, and Independent with IADLs (cooking, cleaning, driving)   PATIENT GOALS "Get left hand moving and working better."    OBJECTIVE: (All objective assessments below are from initial evaluation on: 06/07/22 unless otherwise specified.)    BASELINE HAND DOMINANCE: Right    ADLs: Overall ADLs: No complaints, states able to do all herself without her sister's help, no equipment.   IADLs: Med management and leisure skills seem to be biggest complaint today, but likely receives assist from sister for finances, shopping, etc., and she does not drive.    ACTIVITY TOLERANCE: Activity tolerance: slightly decreased in Lt UE compared to Rt UE   FUNCTIONAL OUTCOME MEASURES: Eval: Patient Specific Functional Scale: 6 (sewing tasks, hold/manipulate pills, play/hold cards)  (Higher Score  =  Better Ability for the Selected Tasks)      UPPER EXTREMITY ROM    Eval: Lt Arm:   Pt is able to make a full fist.  Pt can fully oppose the thumb to the small finger.   AROM Left eval  Shoulder flexion 150* b/l   Shoulder abduction 150* b/l   Elbow flexion Full b/l  Elbow extension Full b/l  Forearm supination Equal 80* b/l  Forearm pronation Equal 80* b/l  Wrist flexion 60 (opposite 60)    Wrist extension 50 (opposite 55*)  (  Blank rows = not tested)    UPPER EXTREMITY MMT:     MMT Left eval  Elbow flexion 5/5  Elbow extension 5/5  Forearm supination 5/5  Forearm pronation 5/5  Wrist flexion 5/5  Wrist extension 4/5  (Blank rows = not tested)  HAND FUNCTION: Eval: Pt with observed weakness in affected hand.  Grip strength: Right: 72 lbs; Left: 55 lbs   COORDINATION: Eval: Pt with observed coordination deficits in affected UE. ( [place] Peg Test: Lt hand 32 sec  (scored 53 sec on 01/06/21)   SENSATION: Eval:  does describe warm sensation when Lt hand touched but no lack of LT sensation.   EDEMA:  Eval: none   MUSCLE TONE:   Eval: LUE: Within functional limits  COGNITION: Eval: Overall cognitive status: Within functional limits for tasks assessed, however she does appear "childlike" at times, mildly regressed perhaps, needing slower and repeated explanations that are somewhat simplified.  OBSERVATIONS:  Eval: mainly seems to have FMS coordination deficits and issues with fast twitch accurate motion, etc.   TODAY'S TREATMENT:   Red putty exercises for sustained grip and pinch, min-mod v.c. and demonstration, Finger tapping activity with handout for pt to follow written instructions to tape each finger in a sequence, pt returned demonstration in prep for typing. Placing graded clothespins on vertical antennae with LUE, min difficulty/ drops. BP 181/88, reviewed CVA warning signs, pt is asymptomatic. Pt verbalized understanding.Pt was instructed to call 911, if any CVA symptoms. PATIENT EDUCATION: Education details: red putty HEP, finger tapping exercise handout Person educated: Patient  Education method: Explanation, Demonstration, Verbal cues, and Handouts Education comprehension: verbalized understanding, returned demonstration, verbal cues required,    HOME EXERCISE PROGRAM: Access Code: [email or web address] URL: [email or web address]   GOALS: Goals reviewed with patient? Yes  SHORT TERM GOALS: Target date: 06/23/22  Pt will demo/state understanding of initial home exercise program (HEP) to improve function, pain, and independence.   Baseline: Needs a plan for rehabilitation  Goal status: 06/21/22: Met    LONG TERM GOALS: Target date: 07/21/22  Pt will improve functional ability by decreased impairment per  PSFS assessment to 8 or better, for better quality of life. Baseline: 6 Goal status: INITIAL  2.  Pt will improve A/ROM in Lt wist ext to at least 60*, to have  prerequisite/functional motion for tasks like reach and grasp.  Baseline: 50* Goal status: INITIAL  3.  Pt will improve endurance in Lt wrist/hand to perform resistive activities for at least 20 mins without breaks to have increased functional ability to carry out selfcare and higher-level homecare tasks with no difficulty Baseline: 5-10 mins Goal status: INITIAL  4.  Pt will improve coordination skills in Lt hand, as seen by increasing score on 9HPT testing to at least 27sec to have increased functional ability to carry out fine motor tasks (fasteners, etc.) and more complex, coordinated IADLs (meal prep, sports, etc.).  Baseline: 32 sec Goal status: INITIAL   ASSESSMENT:  CLINICAL IMPRESSION: Pt is progressing towards goals. She demonstrates understanding of red putty HEP.    PLAN: OT FREQUENCY: 1x/week  OT DURATION: 6 weeks (however 4 weeks therapy (~ 4 additional visits) is anticipated need)   PLANNED INTERVENTIONS: therapeutic exercise, therapeutic activity, neuromuscular re-education, manual therapy, fluidotherapy, moist heat, contrast bath, patient/family education, and coping strategies training  RECOMMENDED OTHER SERVICES: none now   CONSULTED AND AGREED WITH PLAN OF CARE: Patient  PLAN FOR NEXT SESSION:  Monitor BP, Continue to challenge FM coordination, typing   and simulated work activities using LUE, pt is scheduled through 07/19/22   Theone Murdoch, OTR/L [phone] Phone: [phone] 3:06 PM 07/05/22

## 2022-07-12 ENCOUNTER — Ambulatory Visit: Payer: 59 | Admitting: Occupational Therapy

## 2022-07-12 DIAGNOSIS — R208 Other disturbances of skin sensation: Secondary | ICD-10-CM

## 2022-07-12 DIAGNOSIS — R278 Other lack of coordination: Secondary | ICD-10-CM

## 2022-07-12 DIAGNOSIS — M25612 Stiffness of left shoulder, not elsewhere classified: Secondary | ICD-10-CM

## 2022-07-12 DIAGNOSIS — I69354 Hemiplegia and hemiparesis following cerebral infarction affecting left non-dominant side: Secondary | ICD-10-CM

## 2022-07-12 DIAGNOSIS — M25642 Stiffness of left hand, not elsewhere classified: Secondary | ICD-10-CM | POA: Diagnosis not present

## 2022-07-12 DIAGNOSIS — G8929 Other chronic pain: Secondary | ICD-10-CM

## 2022-07-12 DIAGNOSIS — R41840 Attention and concentration deficit: Secondary | ICD-10-CM

## 2022-07-12 DIAGNOSIS — M6281 Muscle weakness (generalized): Secondary | ICD-10-CM

## 2022-07-12 DIAGNOSIS — R2681 Unsteadiness on feet: Secondary | ICD-10-CM

## 2022-07-12 NOTE — Therapy (Signed)
OUTPATIENT OCCUPATIONAL THERAPY TREATMENT NOTE  Patient Name: Shannon Garza MRN: [id number] DOB:[birth year], 56 y.o., female Today's Date: 07/13/2022  PCP: Georgina Quint, MD REFERRING PROVIDER: Horton Chin, MD   OT End of Session - 07/12/22 1448     Visit Number 4    Number of Visits 6    Date for OT Re-Evaluation 07/21/22    Authorization Type UHC    OT Start Time 1450    OT Stop Time 1530    OT Time Calculation (min) 40 min    Activity Tolerance Patient tolerated treatment well;No increased pain;Patient limited by fatigue    Behavior During Therapy [name] for tasks assessed/performed                Past Medical History:  Diagnosis Date   Diabetes mellitus without complication (HCC)    Hypertension    History reviewed. No pertinent surgical history. Patient Active Problem List   Diagnosis Date Noted   History of stroke 01/17/2021   Dyslipidemia 01/17/2021   CVA (cerebral vascular accident) (HCC) 10/18/2020   Primary hypertension    Left arm weakness    Left leg weakness    Subcortical infarction (HCC) 10/12/2020    ONSET DATE: CVA 10/12/20 - chronic   REFERRING DIAG: R29.898 (ICD-10-CM) - Left hand weakness  THERAPY DIAG:  Stiffness of left hand, not elsewhere classified  Stiffness of left shoulder, not elsewhere classified  Chronic left shoulder pain  Other lack of coordination  Hemiplegia and hemiparesis following cerebral infarction affecting left non-dominant side (HCC)  Attention and concentration deficit  Muscle weakness (generalized)  Other disturbances of skin sensation  Unsteadiness on feet  Rationale for Evaluation and Treatment Rehabilitation  PERTINENT HISTORY: Chronic stroke from Feb 2022. She was seen here in May 2022, and just finished course of 4 PT visits in Sept 2023 for L shoulder pain.  Per MD: "Left hand weakness"   PRECAUTIONS: None  WEIGHT BEARING RESTRICTIONS No   SUBJECTIVE:   SUBJECTIVE  STATEMENT: She states she just got off work  PAIN:  Are you having pain? No  Rting: 0/10 at rest now   PLOF: Independent with basic ADLs, Independent with household mobility without device, Independent with community mobility without device, Independent with gait, Independent with transfers, and Independent with IADLs (cooking, cleaning, driving)   PATIENT GOALS "Get left hand moving and working better."    OBJECTIVE: (All objective assessments below are from initial evaluation on: 06/07/22 unless otherwise specified.)    BASELINE HAND DOMINANCE: Right    ADLs: Overall ADLs: No complaints, states able to do all herself without her sister's help, no equipment.   IADLs: Med management and leisure skills seem to be biggest complaint today, but likely receives assist from sister for finances, shopping, etc., and she does not drive.    ACTIVITY TOLERANCE: Activity tolerance: slightly decreased in Lt UE compared to Rt UE   FUNCTIONAL OUTCOME MEASURES: Eval: Patient Specific Functional Scale: 6 (sewing tasks, hold/manipulate pills, play/hold cards)  (Higher Score  =  Better Ability for the Selected Tasks)      UPPER EXTREMITY ROM    Eval: Lt Arm:   Pt is able to make a full fist.  Pt can fully oppose the thumb to the small finger.   AROM Left eval  Shoulder flexion 150* b/l   Shoulder abduction 150* b/l   Elbow flexion Full b/l  Elbow extension Full b/l  Forearm supination Equal 80* b/l  Forearm pronation Equal 80*  b/l  Wrist flexion 60 (opposite 60)    Wrist extension 50 (opposite 55*)  (Blank rows = not tested)    UPPER EXTREMITY MMT:     MMT Left eval  Elbow flexion 5/5  Elbow extension 5/5  Forearm supination 5/5  Forearm pronation 5/5  Wrist flexion 5/5  Wrist extension 4/5  (Blank rows = not tested)  HAND FUNCTION: Eval: Pt with observed weakness in affected hand.  Grip strength: Right: 72 lbs; Left: 55 lbs   COORDINATION: Eval: Pt with observed  coordination deficits in affected UE. [facility] Peg Test: Lt hand 32 sec  (scored 53 sec on 01/06/21)   SENSATION: Eval: does describe warm sensation when Lt hand touched but no lack of LT sensation.   EDEMA:  Eval: none   MUSCLE TONE:   Eval: LUE: Within functional limits  COGNITION: Eval: Overall cognitive status: Within functional limits for tasks assessed, however she does appear "childlike" at times, mildly regressed perhaps, needing slower and repeated explanations that are somewhat simplified.  OBSERVATIONS:  Eval: mainly seems to have FMS coordination deficits and issues with fast twitch accurate motion, etc.   TODAY'S TREATMENT:  Typing test 11 wpm, 84% accuracy Typing games for increased speed and accuracy, min difficulty Pt was instructed in updates to HEP, see pt instructions, min difficulty/ v.c Placing and removing grooved pegs from pegboard with LUE, min difficulty, v.c for in hand manipulation when removing and avoiding compensation. Pt reports one of her finger's triggers, pt was cautioned against overuse of putty if she is having triggering. Pt reports no problems today.  BP 167/72,   PATIENT EDUCATION: Education details: coordination HEP, wrist flexion extension- see pt instructions Person educated: Patient  Education method: Explanation, Demonstration, Verbal cues, and Handouts Education comprehension: verbalized understanding, returned demonstration, verbal cues required,    HOME EXERCISE PROGRAM: Previously issued-Access Code: [email or web address] URL: [email or web address]  Putty exercises 07/13/22- coordination HEP GOALS: Goals reviewed with patient? Yes  SHORT TERM GOALS: Target date: 06/23/22  Pt will demo/state understanding of initial home exercise program (HEP) to improve function, pain, and independence.   Baseline: Needs a plan for rehabilitation  Goal status: 06/21/22: Met    LONG TERM GOALS: Target date: 07/21/22  Pt will improve  functional ability by decreased impairment per  PSFS assessment to 8 or better, for better quality of life. Baseline: 6 Goal status: ongoing  2.  Pt will improve A/ROM in Lt wist ext to at least 60*, to have prerequisite/functional motion for tasks like reach and grasp.  Baseline: 50* Goal status: met 60*  3.  Pt will improve endurance in Lt wrist/hand to perform resistive activities for at least 20 mins without breaks to have increased functional ability to carry out selfcare and higher-level homecare tasks with no difficulty Baseline: 5-10 mins Goal status: met per pt report she is completing laundry, carrying groceries and cooking  4.  Pt will improve coordination skills in Lt hand, as seen by increasing score on 9HPT testing to at least 27sec to have increased functional ability to carry out fine motor tasks (fasteners, etc.) and more complex, coordinated IADLs (meal prep, sports, etc.).  Baseline: 32 sec Goal status: ongoing   ASSESSMENT:  CLINICAL IMPRESSION: Pt is progressing towards goals for fine motor and UE functional use. Pt.is working from home. Discussed with pt. Plans for d/c next visit, she verbalizes understanding.    PLAN: OT FREQUENCY: 1x/week  OT DURATION: 6 weeks (however 4 weeks therapy (~ 4 additional  visits) is anticipated need)   PLANNED INTERVENTIONS: therapeutic exercise, therapeutic activity, neuromuscular re-education, manual therapy, fluidotherapy, moist heat, contrast bath, patient/family education, and coping strategies training  RECOMMENDED OTHER SERVICES: none now   CONSULTED AND AGREED WITH PLAN OF CARE: Patient  PLAN FOR NEXT SESSION:  Monitor BP, Check remaining goals and d/c next visit , LUE, pt is scheduled through 07/19/22   Keene Breath, OTR/L [phone] Phone: [phone] 8:35 AM 07/13/22

## 2022-07-12 NOTE — Patient Instructions (Signed)
          Coordination Activities  Perform the following activities for 20 minutes 1 times per day with left hand(s).  Rotate ball in fingertips (clockwise and counter-clockwise). Flip cards 1 at a time as fast as you can. Deal cards with your thumb (Hold deck in hand and push card off top with thumb). Pick up coins and stack. Pick up coins one at a time until you get 5-10 in your hand, then move coins from palm to fingertips to stack one at a time.AROM: Wrist Extension   .  With __left__ palm down, bend wrist up. Repeat __10__ times per set.  Do __2_ sessions per day.

## 2022-07-12 NOTE — Therapy (Deleted)
OUTPATIENT OCCUPATIONAL THERAPY TREATMENT NOTE  Patient Name: Shannon Garza MRN: [id number] DOB:Nov 29, 1965, 56 y.o., female Today's Date: 07/12/2022  PCP: Horald Pollen, MD REFERRING PROVIDER: Izora Ribas, MD       Past Medical History:  Diagnosis Date   Diabetes mellitus without complication ([place])    Hypertension    No past surgical history on file. Patient Active Problem List   Diagnosis Date Noted   History of stroke 01/17/2021   Dyslipidemia 01/17/2021   CVA (cerebral vascular accident) ([place]) 10/18/2020   Primary hypertension    Left arm weakness    Left leg weakness    Subcortical infarction ([place]) 10/12/2020    ONSET DATE: CVA 10/12/20 - chronic   REFERRING DIAG: R29.898 (ICD-10-CM) - Left hand weakness  THERAPY DIAG:  No diagnosis found.  Rationale for Evaluation and Treatment Rehabilitation  PERTINENT HISTORY: Chronic stroke from Feb 2022. She was seen here in May 2022, and just finished course of 4 PT visits in Sept 2023 for L shoulder pain.  Per MD: "Left hand weakness"   PRECAUTIONS: None  WEIGHT BEARING RESTRICTIONS No   SUBJECTIVE:   SUBJECTIVE STATEMENT: She states she worked some today  PAIN:  Are you having pain? No  Rting: 0/10 at rest now   PLOF: Independent with basic ADLs, Independent with household mobility without device, Independent with community mobility without device, Independent with gait, Independent with transfers, and Independent with IADLs (cooking, cleaning, driving)   PATIENT GOALS "Get left hand moving and working better."    OBJECTIVE: (All objective assessments below are from initial evaluation on: 06/07/22 unless otherwise specified.)    BASELINE HAND DOMINANCE: Right    ADLs: Overall ADLs: No complaints, states able to do all herself without her sister's help, no equipment.   IADLs: Med management and leisure skills seem to be biggest complaint today, but likely receives assist from sister  for finances, shopping, etc., and she does not drive.    ACTIVITY TOLERANCE: Activity tolerance: slightly decreased in Lt UE compared to Rt UE   FUNCTIONAL OUTCOME MEASURES: Eval: Patient Specific Functional Scale: 6 (sewing tasks, hold/manipulate pills, play/hold cards)  (Higher Score  =  Better Ability for the Selected Tasks)      UPPER EXTREMITY ROM    Eval: Lt Arm:   Pt is able to make a full fist.  Pt can fully oppose the thumb to the small finger.   AROM Left eval  Shoulder flexion 150* b/l   Shoulder abduction 150* b/l   Elbow flexion Full b/l  Elbow extension Full b/l  Forearm supination Equal 80* b/l  Forearm pronation Equal 80* b/l  Wrist flexion 60 (opposite 60)    Wrist extension 50 (opposite 55*)  (Blank rows = not tested)    UPPER EXTREMITY MMT:     MMT Left eval  Elbow flexion 5/5  Elbow extension 5/5  Forearm supination 5/5  Forearm pronation 5/5  Wrist flexion 5/5  Wrist extension 4/5  (Blank rows = not tested)  HAND FUNCTION: Eval: Pt with observed weakness in affected hand.  Grip strength: Right: 72 lbs; Left: 55 lbs   COORDINATION: Eval: Pt with observed coordination deficits in affected UE. [facility] Peg Test: Lt hand 32 sec  (scored 53 sec on 01/06/21)   SENSATION: Eval: does describe warm sensation when Lt hand touched but no lack of LT sensation.   EDEMA:  Eval: none   MUSCLE TONE:   Eval: LUE: Within functional limits  COGNITION:  Eval: Overall cognitive status: Within functional limits for tasks assessed, however she does appear "childlike" at times, mildly regressed perhaps, needing slower and repeated explanations that are somewhat simplified.  OBSERVATIONS:  Eval: mainly seems to have FMS coordination deficits and issues with fast twitch accurate motion, etc.   TODAY'S TREATMENT:   Red putty exercises for sustained grip and pinch, min-mod v.c. and demonstration, Finger tapping activity with handout for pt to follow written  instructions to tape each finger in a sequence, pt returned demonstration in prep for typing. Placing graded clothespins on vertical antennae with LUE, min difficulty/ drops. BP 181/88, reviewed CVA warning signs, pt is asymptomatic. Pt verbalized understanding.Pt was instructed to call 911, if any CVA symptoms. PATIENT EDUCATION: Education details: red putty HEP, finger tapping exercise handout Person educated: Patient  Education method: Explanation, Demonstration, Verbal cues, and Handouts Education comprehension: verbalized understanding, returned demonstration, verbal cues required,    HOME EXERCISE PROGRAM: Access Code: [email or web address] URL: [email or web address]   GOALS: Goals reviewed with patient? Yes  SHORT TERM GOALS: Target date: 06/23/22  Pt will demo/state understanding of initial home exercise program (HEP) to improve function, pain, and independence.   Baseline: Needs a plan for rehabilitation  Goal status: 06/21/22: Met    LONG TERM GOALS: Target date: 07/21/22  Pt will improve functional ability by decreased impairment per  PSFS assessment to 8 or better, for better quality of life. Baseline: 6 Goal status: INITIAL  2.  Pt will improve A/ROM in Lt wist ext to at least 60*, to have prerequisite/functional motion for tasks like reach and grasp.  Baseline: 50* Goal status: INITIAL  3.  Pt will improve endurance in Lt wrist/hand to perform resistive activities for at least 20 mins without breaks to have increased functional ability to carry out selfcare and higher-level homecare tasks with no difficulty Baseline: 5-10 mins Goal status: INITIAL  4.  Pt will improve coordination skills in Lt hand, as seen by increasing score on 9HPT testing to at least 27sec to have increased functional ability to carry out fine motor tasks (fasteners, etc.) and more complex, coordinated IADLs (meal prep, sports, etc.).  Baseline: 32 sec Goal status:  INITIAL   ASSESSMENT:  CLINICAL IMPRESSION: Pt is progressing towards goals. She demonstrates understanding of red putty HEP.    PLAN: OT FREQUENCY: 1x/week  OT DURATION: 6 weeks (however 4 weeks therapy (~ 4 additional visits) is anticipated need)   PLANNED INTERVENTIONS: therapeutic exercise, therapeutic activity, neuromuscular re-education, manual therapy, fluidotherapy, moist heat, contrast bath, patient/family education, and coping strategies training  RECOMMENDED OTHER SERVICES: none now   CONSULTED AND AGREED WITH PLAN OF CARE: Patient  PLAN FOR NEXT SESSION:  Monitor BP, Continue to challenge FM coordination, typing and simulated work activities using LUE, pt is scheduled through 07/19/22   Theone Murdoch, OTR/L [phone] Phone: [phone] 10:16 AM 07/12/22

## 2022-07-13 ENCOUNTER — Encounter: Payer: Self-pay | Admitting: Occupational Therapy

## 2022-07-19 ENCOUNTER — Encounter: Payer: Self-pay | Admitting: Occupational Therapy

## 2022-07-19 ENCOUNTER — Ambulatory Visit: Payer: 59 | Admitting: Occupational Therapy

## 2022-07-19 VITALS — BP 186/82

## 2022-07-19 DIAGNOSIS — R278 Other lack of coordination: Secondary | ICD-10-CM

## 2022-07-19 DIAGNOSIS — I69354 Hemiplegia and hemiparesis following cerebral infarction affecting left non-dominant side: Secondary | ICD-10-CM

## 2022-07-19 DIAGNOSIS — R208 Other disturbances of skin sensation: Secondary | ICD-10-CM

## 2022-07-19 DIAGNOSIS — G8929 Other chronic pain: Secondary | ICD-10-CM

## 2022-07-19 DIAGNOSIS — M6281 Muscle weakness (generalized): Secondary | ICD-10-CM

## 2022-07-19 DIAGNOSIS — R41840 Attention and concentration deficit: Secondary | ICD-10-CM

## 2022-07-19 DIAGNOSIS — M25642 Stiffness of left hand, not elsewhere classified: Secondary | ICD-10-CM

## 2022-07-19 DIAGNOSIS — M25612 Stiffness of left shoulder, not elsewhere classified: Secondary | ICD-10-CM

## 2022-07-19 NOTE — Therapy (Signed)
OUTPATIENT OCCUPATIONAL THERAPY TREATMENT NOTE  Patient Name: Shannon Garza MRN: [id number] DOB:03-01-1966, 56 y.o., female Today's Date: 07/19/2022  PCP: Horald Pollen, MD REFERRING PROVIDER: Izora Ribas, MD   OCCUPATIONAL THERAPY DISCHARGE SUMMARY   Current functional level related to goals / functional outcomes: Pt made excellent progress, she achieved 3/4 goals.   Remaining deficits: Decreased strength, decreased coordination   Education / Equipment: Pt was educated in HEP. She demonstrates understanding of all education.  Patient agrees to discharge. Patient goals were partially met. Patient is being discharged due to being pleased with the current functional level..        Past Medical History:  Diagnosis Date   Diabetes mellitus without complication ([place])    Hypertension    No past surgical history on file. Patient Active Problem List   Diagnosis Date Noted   History of stroke 01/17/2021   Dyslipidemia 01/17/2021   CVA (cerebral vascular accident) ([place]) 10/18/2020   Primary hypertension    Left arm weakness    Left leg weakness    Subcortical infarction ([place]) 10/12/2020    ONSET DATE: CVA 10/12/20 - chronic   REFERRING DIAG: R29.898 (ICD-10-CM) - Left hand weakness  THERAPY DIAG:  Stiffness of left hand, not elsewhere classified  Stiffness of left shoulder, not elsewhere classified  Chronic left shoulder pain  Other lack of coordination  Hemiplegia and hemiparesis following cerebral infarction affecting left non-dominant side (H[facility])  Attention and concentration deficit  Muscle weakness (generalized)  Other disturbances of skin sensation  Rationale for Evaluation and Treatment Rehabilitation  PERTINENT HISTORY: Chronic stroke from Feb 2022. She was seen here in May 2022, and just finished course of 4 PT visits in Sept 2023 for L shoulder pain.  Per MD: "Left hand weakness"   PRECAUTIONS: None  WEIGHT BEARING RESTRICTIONS  No   SUBJECTIVE:   SUBJECTIVE STATEMENT: Pt. Agrees with plans for d/c PAIN:  Are you having pain? No  Rting: 0/10 at rest now   PLOF: Independent with basic ADLs, Independent with household mobility without device, Independent with community mobility without device, Independent with gait, Independent with transfers, and Independent with IADLs (cooking, cleaning, driving)   PATIENT GOALS "Get left hand moving and working better."    OBJECTIVE: (All objective assessments below are from initial evaluation on: 06/07/22 unless otherwise specified.)    BASELINE HAND DOMINANCE: Right    ADLs: Overall ADLs: No complaints, states able to do all herself without her sister's help, no equipment.   IADLs: Med management and leisure skills seem to be biggest complaint today, but likely receives assist from sister for finances, shopping, etc., and she does not drive.    ACTIVITY TOLERANCE: Activity tolerance: slightly decreased in Lt UE compared to Rt UE   FUNCTIONAL OUTCOME MEASURES: Eval: Patient Specific Functional Scale: 6 (sewing tasks, hold/manipulate pills, play/hold cards)  (Higher Score  =  Better Ability for the Selected Tasks)      UPPER EXTREMITY ROM    Eval: Lt Arm:   Pt is able to make a full fist.  Pt can fully oppose the thumb to the small finger.   AROM Left eval  Shoulder flexion 150* b/l   Shoulder abduction 150* b/l   Elbow flexion Full b/l  Elbow extension Full b/l  Forearm supination Equal 80* b/l  Forearm pronation Equal 80* b/l  Wrist flexion 60 (opposite 60)    Wrist extension 50 (opposite 55*)  (Blank rows = not tested)    UPPER EXTREMITY  MMT:     MMT Left eval  Elbow flexion 5/5  Elbow extension 5/5  Forearm supination 5/5  Forearm pronation 5/5  Wrist flexion 5/5  Wrist extension 4/5  (Blank rows = not tested)  HAND FUNCTION: Eval: Pt with observed weakness in affected hand.  Grip strength: Right: 72 lbs; Left: 55 lbs    COORDINATION: Eval: Pt with observed coordination deficits in affected UE. [facility] Peg Test: Lt hand 32 sec  (scored 53 sec on 01/06/21)   SENSATION: Eval: does describe warm sensation when Lt hand touched but no lack of LT sensation.   EDEMA:  Eval: none   MUSCLE TONE:   Eval: LUE: Within functional limits  COGNITION: Eval: Overall cognitive status: Within functional limits for tasks assessed, however she does appear "childlike" at times, mildly regressed perhaps, needing slower and repeated explanations that are somewhat simplified.  OBSERVATIONS:  Eval: mainly seems to have FMS coordination deficits and issues with fast twitch accurate motion, etc.   TODAY'S TREATMENT:  Therapist checked progress towards goals. Pt agrees with plans for d/c. [place] pegboard for increased fine motor coordination and in hand manipulation, min difficulty v.c to avoid compensation Gripper set at level 3 for sustained grip, min-mod difficulty Pt is working and doing well overall.  PATIENT EDUCATION: Education details: progress towards goals, and plans for d/c  Person educated: Patient  Education method: Explanation,  Education comprehension: verbalized understanding,    HOME EXERCISE PROGRAM: Previously issued-Access Code: [email or web address] URL: [email or web address]  Putty exercises 07/13/22- coordination HEP GOALS: Goals reviewed with patient? Yes  SHORT TERM GOALS: Target date: 06/23/22  Pt will demo/state understanding of initial home exercise program (HEP) to improve function, pain, and independence.   Baseline: Needs a plan for rehabilitation  Goal status: 06/21/22: Met    LONG TERM GOALS: Target date: 07/21/22  Pt will improve functional ability by decreased impairment per  PSFS assessment to 8 or better, for better quality of life. Baseline: 6 Goal status: met  8, per pt report  2.  Pt will improve A/ROM in Lt wist ext to at least 60*, to have prerequisite/functional  motion for tasks like reach and grasp.  Baseline: 50* Goal status: met 60*  3.  Pt will improve endurance in Lt wrist/hand to perform resistive activities for at least 20 mins without breaks to have increased functional ability to carry out selfcare and higher-level homecare tasks with no difficulty Baseline: 5-10 mins Goal status: met per pt report she is completing laundry, carrying groceries and cooking  4.  Pt will improve coordination skills in Lt hand, as seen by increasing score on 9HPT testing to at least 27sec to have increased functional ability to carry out fine motor tasks (fasteners, etc.) and more complex, coordinated IADLs (meal prep, sports, etc.).  Baseline: 32 sec Goal status: 36.25,not met   ASSESSMENT:  CLINICAL IMPRESSION: Pt made excellent overall progress. She achieved 3/4  PLAN: OT FREQUENCY: 1x/week  OT DURATION: 6 weeks (however 4 weeks therapy (~ 4 additional visits) is anticipated need)   PLANNED INTERVENTIONS: therapeutic exercise, therapeutic activity, neuromuscular re-education, manual therapy, fluidotherapy, moist heat, contrast bath, patient/family education, and coping strategies training  RECOMMENDED OTHER SERVICES: none now   CONSULTED AND AGREED WITH PLAN OF CARE: Patient  PLAN FOR NEXT SESSION: d/c OT- pt to perform exercises at home    Theone Murdoch, OTR/L [phone] Phone: [phone] 2:43 PM 07/19/22

## 2022-09-05 ENCOUNTER — Encounter: Payer: 59 | Attending: Physical Medicine and Rehabilitation | Admitting: Physical Medicine and Rehabilitation

## 2022-09-05 DIAGNOSIS — I10 Essential (primary) hypertension: Secondary | ICD-10-CM

## 2022-09-05 MED ORDER — AMLODIPINE BESYLATE 5 MG PO TABS: 5.0000 mg | ORAL_TABLET | Freq: Every day | ORAL | 11 refills | Status: AC

## 2022-09-05 NOTE — Progress Notes (Signed)
Subjective:    Patient ID: Shannon Garza, female    DOB: September 03, 1965, 57 y.o.   MRN: [id number]  HPI  An audio/video tele-health visit is felt to be the most appropriate encounter for this patient at this time. This is a follow up tele-visit via phone. The patient is at home. MD is at office. Prior to scheduling this appointment, our staff discussed the limitations of evaluation and management by telemedicine and the availability of in-person appointments. The patient expressed understanding and agreed to proceed.   Shannon Garza presents for f/u of HTN  1) Weakness in left side -she feels weaker some times at night -she was concerned regarding whether she was having repeat stroke -she is doing well with walking. -she is still working on Retail banker. -strength is improving  2) HTN: -BP has been elevated to 170s/100s -she has been taking Lisinopril 40mg  daily -she is not taking amlodipine -she felt some numbness in her mouth yesterday when her BP was elevated -she does not eat kiwi -she continues to take her blood pressure medication -she recently started back on the lisinopril as she was tried on another blood pressure medication and it made her dizziness.  -she is going to get beet root powder  3) obesity: -she started walking yesterday -she stopped eating a lot -she feels she needs to build up her fruit and vegetable intake -she ate pasta last night and this was ok -she eats salads -she finally got away from her sister-in-law who cooks unhealthy foods.  -she has continued to lose a lot of weight! -she lost 7 lbs since her last visit on 12/1 -she has [place] nuts, cashew nuts.  -she is doing ok with walking, she has not recently since it has been cold.  -she tries not to eat after 6pm -she is thinking of doing a cleanse.  -she caught herself eating more during the holidays.  -she bout a health drink and she finds it calms her down  4) diabetes: has been 82 when she  checked at home.  -she been very careful with her diet.  -she has been trying to eat about 2 meals per day.  -she eats Ezekial bread and peanut butter with bananas on top of it -sometimes she will eat oatmeal of grits. -she uses yellow grits.   5) fatigue:  -sister in law concerned about this and recommended that she postpone her return to work.   6) h/o stroke: -followed yo with Dr. Leone Haven  7) Bladder dysfunction -she feels she is urinating too much She feels she has too much flow at a time -when her feet swell she goes more frequently -she is getting incontinent at times   Pain Inventory Average Pain 3 Pain Right Now 2 My pain is sharp, dull, and aching  LOCATION OF PAIN   Right hand & fingers    BOWEL Number of stools per week: 7 Oral laxative use No  Type of laxative herbal laxative  Enema or suppository use No  History of colostomy No  Incontinent No   BLADDER Normal In and out cath, frequency n/a Able to self cath No  Bladder incontinence Yes  Frequent urination No  Leakage with coughing Yes  Difficulty starting stream No  Incomplete bladder emptying No    Mobility walk without assistance how many minutes can you walk? 15-30 ability to climb steps?  yes do you drive?  yes Do you have any goals in this area?  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  yes  Neuro/Psych bladder control problems weakness  Prior Studies Any changes since last visit?  no  Physicians involved in your care Neurologist Dr. Pearlean Brownie   Family History  Problem Relation Age of Onset   Sleep apnea Neg Hx    Social History   Socioeconomic History   Marital status: Single    Spouse name: Not on file   Number of children: Not on file   Years of education: Not on file   Highest education level: Not on file  Occupational History   Not on file  Tobacco Use   Smoking status: Never   Smokeless  tobacco: Never  Vaping Use   Vaping Use: Never used  Substance and Sexual Activity   Alcohol use: Not Currently   Drug use: Not Currently   Sexual activity: Not Currently  Other Topics Concern   Not on file  Social History Narrative   Lives with sister in law and brother, and sister in own home   Right Handed   Drinks no caffeine   Social Determinants of Health   Financial Resource Strain: Not on file  Food Insecurity: Not on file  Transportation Needs: Not on file  Physical Activity: Not on file  Stress: Not on file  Social Connections: Not on file   No past surgical history on file. Past Medical History:  Diagnosis Date   Diabetes mellitus without complication (HCC)    Hypertension    There were no vitals taken for this visit.  Opioid Risk Score:   Fall Risk Score:  `1  Depression screen [facility] 2/9     09/13/2021   10:54 AM 04/19/2021    9:25 AM 01/17/2021   11:04 AM 12/24/2020   12:52 PM 12/01/2020   11:25 AM  Depression screen PHQ 2/9  Decreased Interest 0 0 0 0 2  Down, Depressed, Hopeless 0 0 0 0 0  PHQ - 2 Score 0 0 0 0 2  Altered sleeping     0  Tired, decreased energy     1  Change in appetite     0  Feeling bad or failure about yourself      0  Trouble concentrating     0  Moving slowly or fidgety/restless     1  Suicidal thoughts     0  PHQ-9 Score     4    Review of Systems  Musculoskeletal:        Left hand pain & fingers pain  Neurological:  Positive for weakness.  All other systems reviewed and are negative.      Objective:   Physical Exam Not performed    Assessment & Plan:  Shannon Garza is a 57 year old woman who presents for HTN f/u  1) Subcortical infarction -continue HEP -discussed weakness and prognosis -walk 15 minutes per day.  -may restart work on 8/11 -encouraged weight loss.  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   2) Prediabetes: -Reviewed HgbA1c  -will recheck  next time -avoid sugar, bread, pasta, rice -avoid snacking -try to incorporate into your diet some of the following foods which are good for diabetes: 1) cinnamon- imitates effects of insulin, increasing glucose transport into cells ([place] or [place] cinnamon is best, least processed) 2) nuts- can slow down the blood sugar response of carbohydrate rich foods  3) oatmeal- contains and anti-inflammatory compound avenanthramide 4) whole-milk yogurt (best types are no sugar, [place] yogurt, or goat/sheep yogurt) 5) beans- high in protein, fiber, and vitamins, low glycemic index 6) broccoli- great source of vitamin A and C 7) quinoa- higher in protein and fiber than other grains 8) spinach- high in vitamin A, fiber, and protein 9) olive oil- reduces glucose levels, LDL, and triglycerides 10) salmon- excellent amount of omega-3-fatty acids 11) walnuts- rich in antioxidants 12) apples- high in fiber and quercetin 13) carrots- highly nutritious with low impact on blood sugar 14) eggs- improve HDL (good cholesterol), high in protein, keep you satiated 15) turmeric: improves blood sugars, cardiovascular disease, and protects kidney health 16) garlic: improves blood sugar, blood pressure, pain 17) tomatoes: highly nutritious with low impact on blood sugar   3) HTN: -BP is 170s/100s -prescribed amlodipine 5mg  daily -continue Lisinopril 40mg  daily -advised eating beets, kiwi, nuts, high potassium fruits weekly -advised to let me know in 2-3 days if BP is not trending down -Advised checking BP daily at home and logging results to bring into follow-up appointment with PCP and myself. Refilled Lisinopril -Reviewed BP meds today.  -Advised regarding healthy foods that can help lower blood pressure and provided with a list: 1) citrus foods- high in vitamins and minerals 2) salmon and other fatty fish - reduces inflammation and oxylipins 3) swiss chard (leafy green)- high level of nitrates 4)  pumpkin seeds- one of the best natural sources of magnesium 5) Beans and lentils- high in fiber, magnesium, and potassium 6) Berries- high in flavonoids 7) Amaranth (whole grain, can be cooked similarly to rice and oats)- high in magnesium and fiber 8) Pistachios- even more effective at reducing BP than other nuts 9) Carrots- high in phenolic compounds that relax blood vessels and reduce inflammation 10) Celery- contain phthalides that relax tissues of arterial walls 11) Tomatoes- can also improve cholesterol and reduce risk of heart disease 12) Broccoli- good source of magnesium, calcium, and potassium 13) Greek yogurt: high in potassium and calcium 14) Herbs and spices: Celery seed, cilantro, saffron, lemongrass, black cumin, ginseng, cinnamon, cardamom, sweet basil, and ginger 15) Chia and flax seeds- also help to lower cholesterol and blood sugar 16) Beets- high levels of nitrates that relax blood vessels  17) spinach and bananas- high in potassium  -Provided lise of supplements that can help with hypertension:  1) magnesium: one high quality brand is Bioptemizers since it contains all 7 types of magnesium, otherwise over the counter magnesium gluconate 400mg  is a good option 2) B vitamins 3) vitamin D 4) potassium 5) CoQ10 6) L-arginine 7) Vitamin C 8) Beetroot -Educated that goal BP is 120/80. -Made goal to incorporate some of the above foods into diet.    4) Obesity: Weight is 244- lost 23 lbs! -Educated regarding health benefits of weight loss- for pain, general health, chronic disease prevention, immune health, mental health.  -Will monitor weight every visit.  -Consider Roobois tea daily.  -Discussed the benefits of intermittent fasting. -Discussed foods that can assist in weight loss: 1) leafy greens- high in fiber and nutrients 2) dark chocolate- improves metabolism (if prefer sweetened, best to sweeten with honey instead of sugar).  3) cruciferous vegetables- high in  fiber and protein 4) full fat yogurt: high in healthy fat, protein, calcium, and probiotics 5) apples- high in a variety of phytochemicals 6) nuts- high in fiber and protein that increase feelings of fullness 7) grapefruit: rich in nutrients, antioxidants, and  fiber (not to be taken with anticoagulation) 8) beans- high in protein and fiber 9) salmon- has high quality protein and healthy fats 10) green tea- rich in polyphenols 11) eggs- rich in choline and vitamin D 12) tuna- high protein, boosts metabolism 13) avocado- decreases visceral abdominal fat 14) chicken (pasture raised): high in protein and iron 15) blueberries- reduce abdominal fat and cholesterol 16) whole grains- decreases calories retained during digestion, speeds metabolism 17) chia seeds- curb appetite 18) chilies- increases fat metabolism  -Discussed supplements that can be used:  1) Metatrim 400mg  BID 30 minutes before breakfast and dinner  2) Sphaeranthus indicus and Garcinia mangostana (combinations of these and #1 can be found in capsicum and zychrome  3) green coffee bean extract 400mg  twice per day or Irvingia (african mango) 150 to 300mg  twice per day.  5) Bladder dysfunction -continue slippery elm since appears to be helping  5 minutes spent in discussing her HTN, that she is taking Lisinopril, that she is not taking amlodipine, sending amlodipine for her

## 2022-11-21 ENCOUNTER — Encounter: Payer: 59 | Attending: Physical Medicine and Rehabilitation | Admitting: Physical Medicine and Rehabilitation

## 2022-12-19 ENCOUNTER — Encounter: Payer: 59 | Attending: Physical Medicine and Rehabilitation | Admitting: Physical Medicine and Rehabilitation

## 2022-12-19 VITALS — BP 180/93 | HR 56 | Ht 64.0 in | Wt 245.0 lb

## 2022-12-19 DIAGNOSIS — E66813 Obesity, class 3: Secondary | ICD-10-CM

## 2022-12-19 DIAGNOSIS — Z6841 Body Mass Index (BMI) 40.0 and over, adult: Secondary | ICD-10-CM | POA: Insufficient documentation

## 2022-12-19 DIAGNOSIS — I10 Essential (primary) hypertension: Secondary | ICD-10-CM

## 2022-12-19 NOTE — Progress Notes (Deleted)
Subjective [] Expand by Default Patient ID: Shannon Garza, female    DOB: [birth year], 57 y.o.   MRN: [id number]   HPI  An audio/video tele-health visit is felt to be the most appropriate encounter for this patient at this time. This is a follow up tele-visit via phone. The patient is at home. MD is at office. Prior to scheduling this appointment, our staff discussed the limitations of evaluation and management by telemedicine and the availability of in-person appointments. The patient expressed understanding and agreed to proceed.    Mrs. Shannon Garza presents for f/u of HTN   1) Weakness in left side -she feels weaker some times at night -she was concerned regarding whether she was having repeat stroke -she is doing well with walking. -she is still working on Geneticist, molecular. -strength is improving   2) HTN: -BP has been elevated to 170s/100s -she has been taking Lisinopril 40mg  daily -she is not taking amlodipine -she felt some numbness in her mouth yesterday when her BP was elevated -she does not eat kiwi -she continues to take her blood pressure medication -she recently started back on the lisinopril as she was tried on another blood pressure medication and it made her dizziness.  -she is going to get beet root powder   3) obesity: -she started walking yesterday -she stopped eating a lot -she feels she needs to build up her fruit and vegetable intake -she ate pasta last night and this was ok -she eats salads -she finally got away from her sister-in-law who cooks unhealthy foods.  -she has continued to lose a lot of weight! -she lost 7 lbs since her last visit on 12/1 -she has [place] nuts, cashew nuts.  -she is doing ok with walking, she has not recently since it has been cold.  -she tries not to eat after 6pm -she is thinking of doing a cleanse.  -she caught herself eating more during the holidays.  -she bout a health drink and she finds it calms her down   4)  diabetes: has been 82 when she checked at home.  -she been very careful with her diet.  -she has been trying to eat about 2 meals per day.  -she eats Ezekial bread and peanut butter with bananas on top of it -sometimes she will eat oatmeal of grits. -she uses yellow grits.    5) fatigue:  -sister in law concerned about this and recommended that she postpone her return to work.    6) h/o stroke: -followed yo with Dr. Benita Stabile   7) Bladder dysfunction -she feels she is urinating too much She feels she has too much flow at a time -when her feet swell she goes more frequently -she is getting incontinent at times    Pain Inventory Average Pain 4 Pain Right Now 3 My pain is sharp, dull, and aching   LOCATION OF PAIN   Right hand & fingers                BOWEL Number of stools per week: 7 Oral laxative use No  Type of laxative herbal laxative  Enema or suppository use No  History of colostomy No  Incontinent No    BLADDER Normal In and out cath, frequency n/a Able to self cath No  Bladder incontinence Yes  Frequent urination No  Leakage with coughing Yes  Difficulty starting stream No  Incomplete bladder emptying No      Mobility walk without assistance how many minutes can  you walk? 15-30 ability to climb steps?  yes do you drive?  yes Do you have any goals in this area?  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  yes   Neuro/Psych bladder control problems weakness   Prior Studies Any changes since last visit?  no   Physicians involved in your care Neurologist Dr. Pearlean Brownie          Family History  Problem Relation Age of Onset   Sleep apnea Neg Hx      Social History         Socioeconomic History   Marital status: Single      Spouse name: Not on file   Number of children: Not on file   Years of education: Not on file   Highest education level: Not on file  Occupational  History   Not on file  Tobacco Use   Smoking status: Never   Smokeless tobacco: Never  Vaping Use   Vaping Use: Never used  Substance and Sexual Activity   Alcohol use: Not Currently   Drug use: Not Currently   Sexual activity: Not Currently  Other Topics Concern   Not on file  Social History Narrative    Lives with sister in law and brother, and sister in own home    Right Handed    Drinks no caffeine    Social Determinants of Health    Financial Resource Strain: Not on file  Food Insecurity: Not on file  Transportation Needs: Not on file  Physical Activity: Not on file  Stress: Not on file  Social Connections: Not on file    No past surgical history on file.     Past Medical History:  Diagnosis Date   Diabetes mellitus without complication (HCC)     Hypertension      There were no vitals taken for this visit.   Opioid Risk Score:   Fall Risk Score:  `1   Depression screen [facility] 2/9       09/13/2021   10:54 AM 04/19/2021    9:25 AM 01/17/2021   11:04 AM 12/24/2020   12:52 PM 12/01/2020   11:25 AM  Depression screen PHQ 2/9  Decreased Interest 0 0 0 0 2  Down, Depressed, Hopeless 0 0 0 0 0  PHQ - 2 Score 0 0 0 0 2  Altered sleeping         0  Tired, decreased energy         1  Change in appetite         0  Feeling bad or failure about yourself          0  Trouble concentrating         0  Moving slowly or fidgety/restless         1  Suicidal thoughts         0  PHQ-9 Score         4      Review of Systems  Musculoskeletal:        Left hand pain & fingers pain  Neurological:  Positive for weakness.  All other systems reviewed and are negative.         Objective:    Objective  Physical Exam Not performed     Assessment & Plan:  Mrs. Shannon Garza is a 57 year old woman who presents for HTN f/u  1) Subcortical infarction -continue HEP -discussed weakness and prognosis -walk 15 minutes per day.  -may restart work on 8/11 -encouraged weight loss.   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    2) Prediabetes: -Reviewed HgbA1c  -will recheck next time -avoid sugar, bread, pasta, rice -avoid snacking -try to incorporate into your diet some of the following foods which are good for diabetes: 1) cinnamon- imitates effects of insulin, increasing glucose transport into cells ([place] or [place] cinnamon is best, least processed) 2) nuts- can slow down the blood sugar response of carbohydrate rich foods 3) oatmeal- contains and anti-inflammatory compound avenanthramide 4) whole-milk yogurt (best types are no sugar, [place] yogurt, or goat/sheep yogurt) 5) beans- high in protein, fiber, and vitamins, low glycemic index 6) broccoli- great source of vitamin A and C 7) quinoa- higher in protein and fiber than other grains 8) spinach- high in vitamin A, fiber, and protein 9) olive oil- reduces glucose levels, LDL, and triglycerides 10) salmon- excellent amount of omega-3-fatty acids 11) walnuts- rich in antioxidants 12) apples- high in fiber and quercetin 13) carrots- highly nutritious with low impact on blood sugar 14) eggs- improve HDL (good cholesterol), high in protein, keep you satiated 15) turmeric: improves blood sugars, cardiovascular disease, and protects kidney health 16) garlic: improves blood sugar, blood pressure, pain 17) tomatoes: highly nutritious with low impact on blood sugar    3) HTN: -BP is 170s/100s -prescribed amlodipine 5mg  daily -continue Lisinopril 40mg  daily -advised eating beets, kiwi, nuts, high potassium fruits weekly -advised to let me know in 2-3 days if BP is not trending down -Advised checking BP daily at home and logging results to bring into follow-up appointment with PCP and myself. Refilled Lisinopril -Reviewed BP meds today.  -Advised regarding healthy foods that can help lower blood pressure and provided with a list: 1) citrus foods- high in  vitamins and minerals 2) salmon and other fatty fish - reduces inflammation and oxylipins 3) swiss chard (leafy green)- high level of nitrates 4) pumpkin seeds- one of the best natural sources of magnesium 5) Beans and lentils- high in fiber, magnesium, and potassium 6) Berries- high in flavonoids 7) Amaranth (whole grain, can be cooked similarly to rice and oats)- high in magnesium and fiber 8) Pistachios- even more effective at reducing BP than other nuts 9) Carrots- high in phenolic compounds that relax blood vessels and reduce inflammation 10) Celery- contain phthalides that relax tissues of arterial walls 11) Tomatoes- can also improve cholesterol and reduce risk of heart disease 12) Broccoli- good source of magnesium, calcium, and potassium 13) Greek yogurt: high in potassium and calcium 14) Herbs and spices: Celery seed, cilantro, saffron, lemongrass, black cumin, ginseng, cinnamon, cardamom, sweet basil, and ginger 15) Chia and flax seeds- also help to lower cholesterol and blood sugar 16) Beets- high levels of nitrates that relax blood vessels  17) spinach and bananas- high in potassium   -Provided lise of supplements that can help with hypertension:  1) magnesium: one high quality brand is Bioptemizers since it contains all 7 types of magnesium, otherwise over the counter magnesium gluconate 400mg  is a good option 2) B vitamins 3) vitamin D 4) potassium 5) CoQ10 6) L-arginine 7) Vitamin C 8) Beetroot -Educated that goal BP is 120/80. -Made goal to incorporate some of the above foods into diet.     4) Obesity: Weight is 244- lost 23 lbs! -Educated regarding health benefits of weight loss-  for pain, general health, chronic disease prevention, immune health, mental health.  -Will monitor weight every visit.  -Consider Roobois tea daily.  -Discussed the benefits of intermittent fasting. -Discussed foods that can assist in weight loss: 1) leafy greens- high in fiber and  nutrients 2) dark chocolate- improves metabolism (if prefer sweetened, best to sweeten with honey instead of sugar).  3) cruciferous vegetables- high in fiber and protein 4) full fat yogurt: high in healthy fat, protein, calcium, and probiotics 5) apples- high in a variety of phytochemicals 6) nuts- high in fiber and protein that increase feelings of fullness 7) grapefruit: rich in nutrients, antioxidants, and fiber (not to be taken with anticoagulation) 8) beans- high in protein and fiber 9) salmon- has high quality protein and healthy fats 10) green tea- rich in polyphenols 11) eggs- rich in choline and vitamin D 12) tuna- high protein, boosts metabolism 13) avocado- decreases visceral abdominal fat 14) chicken (pasture raised): high in protein and iron 15) blueberries- reduce abdominal fat and cholesterol 16) whole grains- decreases calories retained during digestion, speeds metabolism 17) chia seeds- curb appetite 18) chilies- increases fat metabolism             -Discussed supplements that can be used:             1) Metatrim  BID 30 minutes before breakfast and dinner             2) Sphaeranthus indicus and Garcinia mangostana (combinations of these and #1 can be found in capsicum and zychrome             3) green coffee bean extract  twice per day or [place] ([place] mango) 150 to  twice per day.   5) Bladder dysfunction -continue slippery elm since appears to be helping   5 minutes spent in discussing her HTN, that she is taking Lisinopril, that she is not taking amlodipine, sending amlodipine for her

## 2022-12-19 NOTE — Patient Instructions (Addendum)
HTN: 1) citrus foods- high in vitamins and minerals 2) salmon and other fatty fish - reduces inflammation and oxylipins 3) swiss chard (leafy green)- high level of nitrates 4) pumpkin seeds- one of the best natural sources of magnesium 5) Beans and lentils- high in fiber, magnesium, and potassium 6) Berries- high in flavonoids 7) Amaranth (whole grain, can be cooked similarly to rice and oats)- high in magnesium and fiber 8) Pistachios- even more effective at reducing BP than other nuts 9) Carrots- high in phenolic compounds that relax blood vessels and reduce inflammation 10) Celery- contain phthalides that relax tissues of arterial walls 11) Tomatoes- can also improve cholesterol and reduce risk of heart disease 12) Broccoli- good source of magnesium, calcium, and potassium 13) Greek yogurt: high in potassium and calcium 14) Herbs and spices: Celery seed, cilantro, saffron, lemongrass, black cumin, ginseng, cinnamon, cardamom, sweet basil, and ginger 15) Chia and flax seeds- also help to lower cholesterol and blood sugar 16) Beets- high levels of nitrates that relax blood vessels  17) spinach and bananas- high in potassium  -Provided lise of supplements that can help with hypertension:  1) magnesium: one high quality brand is Bioptemizers since it contains all 7 types of magnesium, otherwise over the counter magnesium gluconate  is a good option 2) B vitamins 3) vitamin D 4) potassium 5) CoQ10 6) L-arginine 7) Vitamin C 8) Beetroot -Educated that goal BP is 120/80. -Made goal to incorporate some of the above foods into diet.

## 2022-12-19 NOTE — Progress Notes (Signed)
Subjective:    Patient ID: Shannon Garza, female    DOB: [birth year], 57 y.o.   MRN: [id number]  HPI   Shannon Garza presents for f/u of HTN, weight loss  1) Weakness in left side -she feels weaker some times at night -she was concerned regarding whether she was having repeat stroke -she is doing well with walking. -she is still working on hand strengthening. -strength is improving  2) HTN: -BP has been elevated to 180/93 -she has been trying to get off her blood pressure medications -she has been taking Lisinopril 40mg  but not every day -she is not taking amlodipine -she felt some numbness in her mouth yesterday when her BP was elevated -she does not eat kiwi -she continues to take her blood pressure medication -she recently started back on the lisinopril as she was tried on another blood pressure medication and it made her dizziness.  -she is going to get beet root powder  3) obesity: -she started walking yesterday -she tries to stay off the sweets -she stopped eating a lot -she feels she needs to build up her fruit and vegetable intake -she ate pasta last night and this was ok -she eats salads -she finally got away from her sister-in-law who cooks unhealthy foods.  -she has continued to lose a lot of weight! -she lost 7 lbs since her last visit on 12/1 -she has [place] nuts, cashew nuts.  -she is doing ok with walking, she has not recently since it has been cold.  -she tries not to eat after 6pm -she is thinking of doing a cleanse.  -she caught herself eating more during the holidays.  -she bout a health drink and she finds it calms her down  4) diabetes: has been 82 when she checked at home.  -she been very careful with her diet.  -she has been trying to eat about 2 meals per day.  -she eats Ezekial bread and peanut butter with bananas on top of it -sometimes she will eat oatmeal of grits. -she uses yellow grits.   5) fatigue:  -sister in law concerned about  this and recommended that she postpone her return to work.   6) h/o stroke: -followed yo with Dr. Benita Stabile  7) Bladder dysfunction -she feels she is urinating too much She feels she has too much flow at a time -when her feet swell she goes more frequently -she is getting incontinent at times   Pain Inventory Average Pain 4 Pain Right Now 3 My pain is sharp, dull, and aching  LOCATION OF PAIN   Right hand & fingers    BOWEL Number of stools per week: 7 Oral laxative use No  Type of laxative herbal laxative  Enema or suppository use No  History of colostomy No  Incontinent No   BLADDER Normal In and out cath, frequency n/a Able to self cath No  Bladder incontinence Yes  Frequent urination No  Leakage with coughing Yes  Difficulty starting stream No  Incomplete bladder emptying No    Mobility walk without assistance how many minutes can you walk? 15-30 ability to climb steps?  yes do you drive?  yes Do you have any goals in this area?  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  yes  Neuro/Psych bladder control problems weakness  Prior Studies Any changes since last visit?  no  Physicians involved in your care Neurologist  Dr. Pearlean Brownie   Family History  Problem Relation Age of Onset   Sleep apnea Neg Hx    Social History   Socioeconomic History   Marital status: Single    Spouse name: Not on file   Number of children: Not on file   Years of education: Not on file   Highest education level: Not on file  Occupational History   Not on file  Tobacco Use   Smoking status: Never   Smokeless tobacco: Never  Vaping Use   Vaping Use: Never used  Substance and Sexual Activity   Alcohol use: Not Currently   Drug use: Not Currently   Sexual activity: Not Currently  Other Topics Concern   Not on file  Social History Narrative   Lives with sister in law and brother, and sister in own  home   Right Handed   Drinks no caffeine   Social Determinants of Health   Financial Resource Strain: Not on file  Food Insecurity: Not on file  Transportation Needs: Not on file  Physical Activity: Not on file  Stress: Not on file  Social Connections: Not on file   No past surgical history on file. Past Medical History:  Diagnosis Date   Diabetes mellitus without complication (HCC)    Hypertension    Ht  (1.626 m)   Wt 245 lb (111.1 kg)   BMI 42.05 kg/m   Opioid Risk Score:   Fall Risk Score:  `1  Depression screen [facility] 2/9     12/19/2022   10:03 AM 09/13/2021   10:54 AM 04/19/2021    9:25 AM 01/17/2021   11:04 AM 12/24/2020   12:52 PM 12/01/2020   11:25 AM  Depression screen PHQ 2/9  Decreased Interest 2 0 0 0 0 2  Down, Depressed, Hopeless 1 0 0 0 0 0  PHQ - 2 Score 3 0 0 0 0 2  Altered sleeping      0  Tired, decreased energy      1  Change in appetite      0  Feeling bad or failure about yourself       0  Trouble concentrating      0  Moving slowly or fidgety/restless      1  Suicidal thoughts      0  PHQ-9 Score      4    Review of Systems  Musculoskeletal:        Left hand pain & fingers pain  Neurological:  Positive for weakness.  All other systems reviewed and are negative.      Objective:   Physical Exam Not performed    Assessment & Plan:  Shannon Garza is a 57 year old woman who presents for HTN f/u  1) Subcortical infarction -continue HEP -discussed weakness and prognosis -walk 15 minutes per day.  -may restart work on 8/11 -encouraged weight loss.  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   2) Prediabetes: -Reviewed HgbA1c  -will recheck next time -avoid sugar, bread, pasta, rice -avoid snacking -try to incorporate into your diet some of the following foods which are good for diabetes: 1) cinnamon- imitates effects of insulin, increasing glucose transport into cells ([place]  or [place] cinnamon is best, least processed) 2) nuts- can slow down the blood sugar response of carbohydrate rich foods 3) oatmeal- contains and anti-inflammatory compound avenanthramide 4) whole-milk yogurt (best types are no sugar, [place]  yogurt, or goat/sheep yogurt) 5) beans- high in protein, fiber, and vitamins, low glycemic index 6) broccoli- great source of vitamin A and C 7) quinoa- higher in protein and fiber than other grains 8) spinach- high in vitamin A, fiber, and protein 9) olive oil- reduces glucose levels, LDL, and triglycerides 10) salmon- excellent amount of omega-3-fatty acids 11) walnuts- rich in antioxidants 12) apples- high in fiber and quercetin 13) carrots- highly nutritious with low impact on blood sugar 14) eggs- improve HDL (good cholesterol), high in protein, keep you satiated 15) turmeric: improves blood sugars, cardiovascular disease, and protects kidney health 16) garlic: improves blood sugar, blood pressure, pain 17) tomatoes: highly nutritious with low impact on blood sugar   3) HTN: -BP is 170s/100s -continue amlodipine 5mg  daily -discussee starting resistance training -continue Lisinopril 40mg  daily -discussed clonidine but side effects are not acceptable  -advised eating beets, kiwi, nuts, high potassium fruits weekly -advised to let me know in 2-3 days if BP is not trending down -Advised checking BP daily at home and logging results to bring into follow-up appointment with PCP and myself. -Reviewed BP meds today.  -Advised regarding healthy foods that can help lower blood pressure and provided with a list: 1) citrus foods- high in vitamins and minerals 2) salmon and other fatty fish - reduces inflammation and oxylipins 3) swiss chard (leafy green)- high level of nitrates 4) pumpkin seeds- one of the best natural sources of magnesium 5) Beans and lentils- high in fiber, magnesium, and potassium 6) Berries- high in flavonoids 7) Amaranth (whole  grain, can be cooked similarly to rice and oats)- high in magnesium and fiber 8) Pistachios- even more effective at reducing BP than other nuts 9) Carrots- high in phenolic compounds that relax blood vessels and reduce inflammation 10) Celery- contain phthalides that relax tissues of arterial walls 11) Tomatoes- can also improve cholesterol and reduce risk of heart disease 12) Broccoli- good source of magnesium, calcium, and potassium 13) Greek yogurt: high in potassium and calcium 14) Herbs and spices: Celery seed, cilantro, saffron, lemongrass, black cumin, ginseng, cinnamon, cardamom, sweet basil, and ginger 15) Chia and flax seeds- also help to lower cholesterol and blood sugar 16) Beets- high levels of nitrates that relax blood vessels  17) spinach and bananas- high in potassium  -Provided lise of supplements that can help with hypertension:  1) magnesium: one high quality brand is Bioptemizers since it contains all 7 types of magnesium, otherwise over the counter magnesium gluconate 400mg  is a good option 2) B vitamins 3) vitamin D 4) potassium 5) CoQ10 6) L-arginine 7) Vitamin C 8) Beetroot -Educated that goal BP is 120/80. -Made goal to incorporate some of the above foods into diet.    4) Obesity: Discussed that she lost 28 lbs! -discussed that she is already eating a very healthy diet -Educated regarding health benefits of weight loss- for pain, general health, chronic disease prevention, immune health, mental health.  -Will monitor weight every visit.  -Consider Roobois tea daily.  -Discussed the benefits of intermittent fasting. -Discussed foods that can assist in weight loss: 1) leafy greens- high in fiber and nutrients 2) dark chocolate- improves metabolism (if prefer sweetened, best to sweeten with honey instead of sugar).  3) cruciferous vegetables- high in fiber and protein 4) full fat yogurt: high in healthy fat, protein, calcium, and probiotics 5) apples- high in  a variety of phytochemicals 6) nuts- high in fiber and protein that increase feelings of fullness 7) grapefruit:  rich in nutrients, antioxidants, and fiber (not to be taken with anticoagulation) 8) beans- high in protein and fiber 9) salmon- has high quality protein and healthy fats 10) green tea- rich in polyphenols 11) eggs- rich in choline and vitamin D 12) tuna- high protein, boosts metabolism 13) avocado- decreases visceral abdominal fat 14) chicken (pasture raised): high in protein and iron 15) blueberries- reduce abdominal fat and cholesterol 16) whole grains- decreases calories retained during digestion, speeds metabolism 17) chia seeds- curb appetite 18) chilies- increases fat metabolism  -Discussed supplements that can be used:  1) Metatrim  BID 30 minutes before breakfast and dinner  2) Sphaeranthus indicus and Garcinia mangostana (combinations of these and #1 can be found in capsicum and zychrome  3) green coffee bean extract  twice per day or [place] ([place] mango) 150 to  twice per day.  5) Bladder dysfunction -continue slippery elm since appears to be helping

## 2023-01-30 ENCOUNTER — Encounter: Payer: 59 | Admitting: Physical Medicine and Rehabilitation

## 2023-03-23 ENCOUNTER — Encounter: Payer: 59 | Admitting: Physical Medicine and Rehabilitation

## 2023-04-06 ENCOUNTER — Encounter: Payer: Self-pay | Admitting: Physical Medicine and Rehabilitation

## 2023-04-06 ENCOUNTER — Encounter: Payer: 59 | Attending: Physical Medicine and Rehabilitation | Admitting: Physical Medicine and Rehabilitation

## 2023-04-06 VITALS — BP 137/80 | HR 61 | Ht 64.0 in | Wt 251.2 lb

## 2023-04-06 DIAGNOSIS — I10 Essential (primary) hypertension: Secondary | ICD-10-CM | POA: Diagnosis not present

## 2023-04-06 DIAGNOSIS — R29898 Other symptoms and signs involving the musculoskeletal system: Secondary | ICD-10-CM | POA: Diagnosis not present

## 2023-04-06 DIAGNOSIS — M79604 Pain in right leg: Secondary | ICD-10-CM | POA: Diagnosis not present

## 2023-04-06 DIAGNOSIS — Z6841 Body Mass Index (BMI) 40.0 and over, adult: Secondary | ICD-10-CM | POA: Diagnosis present

## 2023-04-06 DIAGNOSIS — N319 Neuromuscular dysfunction of bladder, unspecified: Secondary | ICD-10-CM

## 2023-04-06 DIAGNOSIS — E66813 Obesity, class 3: Secondary | ICD-10-CM

## 2023-04-06 NOTE — Patient Instructions (Signed)
Turmeric ginger

## 2023-04-06 NOTE — Progress Notes (Signed)
Subjective:    Patient ID: Shannon Garza, female    DOB: 11-01-1965, 57 y.o.   MRN: [id number]  HPI   Shannon Garza presents for f/u of HTN, weight loss  1) Weakness in left side -she feels overall stronger -makes herself pick up things at home -she feels weaker some times at night -she was concerned regarding whether she was having repeat stroke -she is doing well with walking. -she is still working on hand strengthening. -strength is improving  2) HTN: -has not checked blood pressure athome.  -she has been trying to get off her blood pressure medications -she has been taking Lisinopril 40mg  but not every day -she is not taking amlodipine -she felt some numbness in her mouth yesterday when her BP was elevated -she does not eat kiwi -she continues to take her blood pressure medication -she recently started back on the lisinopril as she was tried on another blood pressure medication and it made her dizziness.  -she is going to get beet root powder  3) obesity: -she started walking yesterday -she does not eat much -she tries to stay off the sweets -she stopped eating a lot -she feels she needs to build up her fruit and vegetable intake -she ate pasta last night and this was ok -she eats salads -she finally got away from her sister-in-law who cooks unhealthy foods.  -she has continued to lose a lot of weight! -she lost 7 lbs since her last visit on 12/1 -she has [place] nuts, cashew nuts.  -she is doing ok with walking, she has not recently since it has been cold.  -she tries not to eat after 6pm -she is thinking of doing a cleanse.  -she caught herself eating more during the holidays.  -she bout a health drink and she finds it calms her down  4) diabetes: has been 82 when she checked at home.  -she been very careful with her diet.  -she has been trying to eat about 2 meals per day.  -she eats Ezekial bread and peanut butter with bananas on top of it -sometimes she  will eat oatmeal of grits. -she uses yellow grits.   5) fatigue:  -sister in law concerned about this and recommended that she postpone her return to work.   6) h/o stroke: -followed yo with Dr. Benita Stabile  7) Bladder dysfunction -she feels she is urinating too much She feels she has too much flow at a time -when her feet swell she goes more frequently -she is getting incontinent at times   Pain Inventory Average Pain 7 Pain Right Now 1 My pain is sharp  LOCATION OF PAIN   right ankle area  What time of day is pain worst? Night and first thing in the morning SLEEP      good Pain is worse with walking and when I wake up in the morning Pain improves with massaging the area  Relief: 7    Family History  Problem Relation Age of Onset   Sleep apnea Neg Hx    Social History   Socioeconomic History   Marital status: Single    Spouse name: Not on file   Number of children: Not on file   Years of education: Not on file   Highest education level: Not on file  Occupational History   Not on file  Tobacco Use   Smoking status: Never   Smokeless tobacco: Never  Vaping Use   Vaping status: Never Used  Substance and  Sexual Activity   Alcohol use: Not Currently   Drug use: Not Currently   Sexual activity: Not Currently  Other Topics Concern   Not on file  Social History Narrative   Lives with sister in law and brother, and sister in own home   Right Handed   Drinks no caffeine   Social Determinants of Health   Financial Resource Strain: Not on file  Food Insecurity: Not on file  Transportation Needs: Not on file  Physical Activity: Not on file  Stress: Not on file  Social Connections: Not on file   No past surgical history on file. Past Medical History:  Diagnosis Date   Diabetes mellitus without complication (HCC)    Hypertension    BP 137/80   Pulse 61   Ht 5\' 4"  (1.626 m)   Wt 251 lb 3.2 oz (113.9 kg)   SpO2 98%   BMI 43.12 kg/m   Opioid Risk Score:    Fall Risk Score:  `1  Depression screen [facility] 2/9     04/06/2023    9:44 AM 12/19/2022   10:03 AM 09/13/2021   10:54 AM 04/19/2021    9:25 AM 01/17/2021   11:04 AM 12/24/2020   12:52 PM 12/01/2020   11:25 AM  Depression screen PHQ 2/9  Decreased Interest 0 2 0 0 0 0 2  Down, Depressed, Hopeless 0 1 0 0 0 0 0  PHQ - 2 Score 0 3 0 0 0 0 2  Altered sleeping       0  Tired, decreased energy       1  Change in appetite       0  Feeling bad or failure about yourself        0  Trouble concentrating       0  Moving slowly or fidgety/restless       1  Suicidal thoughts       0  PHQ-9 Score       4    Review of Systems  Constitutional: Negative.   HENT: Negative.    Eyes: Negative.   Respiratory: Negative.    Cardiovascular: Negative.   Gastrointestinal: Negative.   Endocrine: Negative.   Genitourinary: Negative.   Musculoskeletal:  Positive for gait problem.       Pain in right ankle/leg  Skin: Negative.   Allergic/Immunologic: Negative.   Hematological: Negative.   Psychiatric/Behavioral: Negative.    All other systems reviewed and are negative.      Objective:   Physical Exam Not performed    Assessment & Plan:  Shannon Garza is a 57 year old woman who presents for HTN f/u  1) Subcortical infarction -continue HEP -discussed weakness and prognosis -walk 15 minutes per day.  -may restart work on 8/11 -encouraged weight loss.  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   2) Prediabetes: -Reviewed HgbA1c  -will recheck next time -avoid sugar, bread, pasta, rice -avoid snacking -try to incorporate into your diet some of the following foods which are good for diabetes: 1) cinnamon- imitates effects of insulin, increasing glucose transport into cells ([place] or [place] cinnamon is best, least processed) 2) nuts- can slow down the blood sugar response of carbohydrate rich foods 3) oatmeal- contains and  anti-inflammatory compound avenanthramide 4) whole-milk yogurt (best types are no sugar, [place] yogurt, or goat/sheep yogurt) 5) beans- high in protein, fiber, and vitamins, low glycemic index 6) broccoli- great source  of vitamin A and C 7) quinoa- higher in protein and fiber than other grains 8) spinach- high in vitamin A, fiber, and protein 9) olive oil- reduces glucose levels, LDL, and triglycerides 10) salmon- excellent amount of omega-3-fatty acids 11) walnuts- rich in antioxidants 12) apples- high in fiber and quercetin 13) carrots- highly nutritious with low impact on blood sugar 14) eggs- improve HDL (good cholesterol), high in protein, keep you satiated 15) turmeric: improves blood sugars, cardiovascular disease, and protects kidney health 16) garlic: improves blood sugar, blood pressure, pain 17) tomatoes: highly nutritious with low impact on blood sugar   3) HTN: -commended on improvements in BP! -continue amlodipine 5mg  daily -discussee starting resistance training -continue Lisinopril 40mg  daily -discussed clonidine but side effects are not acceptable  -advised eating beets, kiwi, nuts, high potassium fruits weekly -advised to let me know in 2-3 days if BP is not trending down -Advised checking BP daily at home and logging results to bring into follow-up appointment with PCP and myself. -Reviewed BP meds today.  -Advised regarding healthy foods that can help lower blood pressure and provided with a list: 1) citrus foods- high in vitamins and minerals 2) salmon and other fatty fish - reduces inflammation and oxylipins 3) swiss chard (leafy green)- high level of nitrates 4) pumpkin seeds- one of the best natural sources of magnesium 5) Beans and lentils- high in fiber, magnesium, and potassium 6) Berries- high in flavonoids 7) Amaranth (whole grain, can be cooked similarly to rice and oats)- high in magnesium and fiber 8) Pistachios- even more effective at reducing BP than  other nuts 9) Carrots- high in phenolic compounds that relax blood vessels and reduce inflammation 10) Celery- contain phthalides that relax tissues of arterial walls 11) Tomatoes- can also improve cholesterol and reduce risk of heart disease 12) Broccoli- good source of magnesium, calcium, and potassium 13) Greek yogurt: high in potassium and calcium 14) Herbs and spices: Celery seed, cilantro, saffron, lemongrass, black cumin, ginseng, cinnamon, cardamom, sweet basil, and ginger 15) Chia and flax seeds- also help to lower cholesterol and blood sugar 16) Beets- high levels of nitrates that relax blood vessels  17) spinach and bananas- high in potassium  -Provided lise of supplements that can help with hypertension:  1) magnesium: one high quality brand is Bioptemizers since it contains all 7 types of magnesium, otherwise over the counter magnesium gluconate 400mg  is a good option 2) B vitamins 3) vitamin D 4) potassium 5) CoQ10 6) L-arginine 7) Vitamin C 8) Beetroot -Educated that goal BP is 120/80. -Made goal to incorporate some of the above foods into diet.    4) Obesity: -discussed that she does not eat much -recommended replacing morning cereal with yogurt Discussed that she lost 28 lbs! -discussed that she is already eating a very healthy diet -Educated regarding health benefits of weight loss- for pain, general health, chronic disease prevention, immune health, mental health.  -Will monitor weight every visit.  -Consider Roobois tea daily.  -Discussed the benefits of intermittent fasting. -Discussed foods that can assist in weight loss: 1) leafy greens- high in fiber and nutrients 2) dark chocolate- improves metabolism (if prefer sweetened, best to sweeten with honey instead of sugar).  3) cruciferous vegetables- high in fiber and protein 4) full fat yogurt: high in healthy fat, protein, calcium, and probiotics 5) apples- high in a variety of phytochemicals 6) nuts- high  in fiber and protein that increase feelings of fullness 7) grapefruit: rich in nutrients, antioxidants,  and fiber (not to be taken with anticoagulation) 8) beans- high in protein and fiber 9) salmon- has high quality protein and healthy fats 10) green tea- rich in polyphenols 11) eggs- rich in choline and vitamin D 12) tuna- high protein, boosts metabolism 13) avocado- decreases visceral abdominal fat 14) chicken (pasture raised): high in protein and iron 15) blueberries- reduce abdominal fat and cholesterol 16) whole grains- decreases calories retained during digestion, speeds metabolism 17) chia seeds- curb appetite 18) chilies- increases fat metabolism  -Discussed supplements that can be used:  1) Metatrim 400mg  BID 30 minutes before breakfast and dinner  2) Sphaeranthus indicus and Garcinia mangostana (combinations of these and #1 can be found in capsicum and zychrome  3) green coffee bean extract 400mg  twice per day or [place] ([place] mango) 150 to 300mg  twice per day.  5) Bladder dysfunction -ccontinue slippery elm since appears to be helping  6) Right leg pain: -VAS [place] ordered -discussed red light therapy

## 2023-04-18 ENCOUNTER — Ambulatory Visit (HOSPITAL_COMMUNITY)
Admission: RE | Admit: 2023-04-18 | Discharge: 2023-04-18 | Disposition: A | Discharge status: Home / Self Care | Payer: 59 | Source: Ambulatory Visit | Attending: Physical Medicine and Rehabilitation | Admitting: Physical Medicine and Rehabilitation

## 2023-04-18 DIAGNOSIS — M79604 Pain in right leg: Secondary | ICD-10-CM | POA: Insufficient documentation

## 2023-04-24 ENCOUNTER — Ambulatory Visit: Payer: 59 | Admitting: Physical Therapy

## 2023-04-24 ENCOUNTER — Encounter: Payer: 59 | Admitting: Physical Medicine and Rehabilitation

## 2023-04-24 DIAGNOSIS — M79604 Pain in right leg: Secondary | ICD-10-CM

## 2023-04-24 NOTE — Progress Notes (Signed)
Subjective:    Patient ID: Shannon Garza, female    DOB: 08/13/1966, 57 y.o.   MRN: [id number]  HPI  An audio/video tele-health visit is felt to be the most appropriate encounter for this patient at this time. This is a follow up tele-visit via phone. The patient is at home. MD is at office. Prior to scheduling this appointment, our staff discussed the limitations of evaluation and management by telemedicine and the availability of in-person appointments. The patient expressed understanding and agreed to proceed.   Shannon Garza presents for f/u of HTN, weight loss  1) Weakness in left side -she feels overall stronger -makes herself pick up things at home -she feels weaker some times at night -she was concerned regarding whether she was having repeat stroke -she is doing well with walking. -she is still working on hand strengthening. -strength is improving  2) HTN: -has not checked blood pressure athome.  -she has been trying to get off her blood pressure medications -she has been taking Lisinopril 40mg  but not every day -she is not taking amlodipine -she felt some numbness in her mouth yesterday when her BP was elevated -she does not eat kiwi -she continues to take her blood pressure medication -she recently started back on the lisinopril as she was tried on another blood pressure medication and it made her dizziness.  -she is going to get beet root powder  3) obesity: -she started walking yesterday -she does not eat much -she tries to stay off the sweets -she stopped eating a lot -she feels she needs to build up her fruit and vegetable intake -she ate pasta last night and this was ok -she eats salads -she finally got away from her sister-in-law who cooks unhealthy foods.  -she has continued to lose a lot of weight! -she lost 7 lbs since her last visit on 12/1 -she has [place] nuts, cashew nuts.  -she is doing ok with walking, she has not recently since it has been cold.   -she tries not to eat after 6pm -she is thinking of doing a cleanse.  -she caught herself eating more during the holidays.  -she bout a health drink and she finds it calms her down  4) diabetes: has been 82 when she checked at home.  -she been very careful with her diet.  -she has been trying to eat about 2 meals per day.  -she eats Ezekial bread and peanut butter with bananas on top of it -sometimes she will eat oatmeal of grits. -she uses yellow grits.   5) fatigue:  -sister in law concerned about this and recommended that she postpone her return to work.   6) h/o stroke: -followed yo with Dr. Benita Stabile  7) Bladder dysfunction -she feels she is urinating too much She feels she has too much flow at a time -when her feet swell she goes more frequently -she is getting incontinent at times   8) RLE pain: -discussed that ultrasound was negative for clot  Pain Inventory Average Pain 7 Pain Right Now 1 My pain is sharp  LOCATION OF PAIN   right ankle area  What time of day is pain worst? Night and first thing in the morning SLEEP      good Pain is worse with walking and when I wake up in the morning Pain improves with massaging the area  Relief: 7    Family History  Problem Relation Age of Onset   Sleep apnea Neg Hx  Social History   Socioeconomic History   Marital status: Single    Spouse name: Not on file   Number of children: Not on file   Years of education: Not on file   Highest education level: Not on file  Occupational History   Not on file  Tobacco Use   Smoking status: Never   Smokeless tobacco: Never  Vaping Use   Vaping status: Never Used  Substance and Sexual Activity   Alcohol use: Not Currently   Drug use: Not Currently   Sexual activity: Not Currently  Other Topics Concern   Not on file  Social History Narrative   Lives with sister in law and brother, and sister in own home   Right Handed   Drinks no caffeine   Social Determinants of  Health   Financial Resource Strain: Not on file  Food Insecurity: Not on file  Transportation Needs: Not on file  Physical Activity: Not on file  Stress: Not on file  Social Connections: Not on file   No past surgical history on file. Past Medical History:  Diagnosis Date   Diabetes mellitus without complication (HCC)    Hypertension    There were no vitals taken for this visit.  Opioid Risk Score:   Fall Risk Score:  `1  Depression screen [facility] 2/9     04/06/2023    9:44 AM 12/19/2022   10:03 AM 09/13/2021   10:54 AM 04/19/2021    9:25 AM 01/17/2021   11:04 AM 12/24/2020   12:52 PM 12/01/2020   11:25 AM  Depression screen PHQ 2/9  Decreased Interest 0 2 0 0 0 0 2  Down, Depressed, Hopeless 0 1 0 0 0 0 0  PHQ - 2 Score 0 3 0 0 0 0 2  Altered sleeping       0  Tired, decreased energy       1  Change in appetite       0  Feeling bad or failure about yourself        0  Trouble concentrating       0  Moving slowly or fidgety/restless       1  Suicidal thoughts       0  PHQ-9 Score       4    Review of Systems  Constitutional: Negative.   HENT: Negative.    Eyes: Negative.   Respiratory: Negative.    Cardiovascular: Negative.   Gastrointestinal: Negative.   Endocrine: Negative.   Genitourinary: Negative.   Musculoskeletal:  Positive for gait problem.       Pain in right ankle/leg  Skin: Negative.   Allergic/Immunologic: Negative.   Hematological: Negative.   Psychiatric/Behavioral: Negative.    All other systems reviewed and are negative.      Objective:   Physical Exam Not performed    Assessment & Plan:  Shannon Garza is a 57 year old woman who presents for HTN f/u  1) Subcortical infarction -continue HEP -discussed weakness and prognosis -walk 15 minutes per day.  -may restart work on 8/11 -encouraged weight loss.  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   2) Prediabetes: -Reviewed HgbA1c  -will  recheck next time -avoid sugar, bread, pasta, rice -avoid snacking -try to incorporate into your diet some of the following foods which are good for diabetes: 1) cinnamon- imitates effects of insulin, increasing glucose transport into cells ([place] or [place] cinnamon  is best, least processed) 2) nuts- can slow down the blood sugar response of carbohydrate rich foods 3) oatmeal- contains and anti-inflammatory compound avenanthramide 4) whole-milk yogurt (best types are no sugar, [place] yogurt, or goat/sheep yogurt) 5) beans- high in protein, fiber, and vitamins, low glycemic index 6) broccoli- great source of vitamin A and C 7) quinoa- higher in protein and fiber than other grains 8) spinach- high in vitamin A, fiber, and protein 9) olive oil- reduces glucose levels, LDL, and triglycerides 10) salmon- excellent amount of omega-3-fatty acids 11) walnuts- rich in antioxidants 12) apples- high in fiber and quercetin 13) carrots- highly nutritious with low impact on blood sugar 14) eggs- improve HDL (good cholesterol), high in protein, keep you satiated 15) turmeric: improves blood sugars, cardiovascular disease, and protects kidney health 16) garlic: improves blood sugar, blood pressure, pain 17) tomatoes: highly nutritious with low impact on blood sugar   3) HTN: -commended on improvements in BP! -continue amlodipine 5mg  daily -discussee starting resistance training -continue Lisinopril 40mg  daily -discussed clonidine but side effects are not acceptable  -advised eating beets, kiwi, nuts, high potassium fruits weekly -advised to let me know in 2-3 days if BP is not trending down -Advised checking BP daily at home and logging results to bring into follow-up appointment with PCP and myself. -Reviewed BP meds today.  -Advised regarding healthy foods that can help lower blood pressure and provided with a list: 1) citrus foods- high in vitamins and minerals 2) salmon and other fatty fish  - reduces inflammation and oxylipins 3) swiss chard (leafy green)- high level of nitrates 4) pumpkin seeds- one of the best natural sources of magnesium 5) Beans and lentils- high in fiber, magnesium, and potassium 6) Berries- high in flavonoids 7) Amaranth (whole grain, can be cooked similarly to rice and oats)- high in magnesium and fiber 8) Pistachios- even more effective at reducing BP than other nuts 9) Carrots- high in phenolic compounds that relax blood vessels and reduce inflammation 10) Celery- contain phthalides that relax tissues of arterial walls 11) Tomatoes- can also improve cholesterol and reduce risk of heart disease 12) Broccoli- good source of magnesium, calcium, and potassium 13) Greek yogurt: high in potassium and calcium 14) Herbs and spices: Celery seed, cilantro, saffron, lemongrass, black cumin, ginseng, cinnamon, cardamom, sweet basil, and ginger 15) Chia and flax seeds- also help to lower cholesterol and blood sugar 16) Beets- high levels of nitrates that relax blood vessels  17) spinach and bananas- high in potassium  -Provided lise of supplements that can help with hypertension:  1) magnesium: one high quality brand is Bioptemizers since it contains all 7 types of magnesium, otherwise over the counter magnesium gluconate 400mg  is a good option 2) B vitamins 3) vitamin D 4) potassium 5) CoQ10 6) L-arginine 7) Vitamin C 8) Beetroot -Educated that goal BP is 120/80. -Made goal to incorporate some of the above foods into diet.    4) Obesity: -discussed that she does not eat much -recommended replacing morning cereal with yogurt Discussed that she lost 28 lbs! -discussed that she is already eating a very healthy diet -Educated regarding health benefits of weight loss- for pain, general health, chronic disease prevention, immune health, mental health.  -Will monitor weight every visit.  -Consider [place] tea daily.  -Discussed the benefits of intermittent  fasting. -Discussed foods that can assist in weight loss: 1) leafy greens- high in fiber and nutrients 2) dark chocolate- improves metabolism (if prefer sweetened, best to sweeten  with honey instead of sugar).  3) cruciferous vegetables- high in fiber and protein 4) full fat yogurt: high in healthy fat, protein, calcium, and probiotics 5) apples- high in a variety of phytochemicals 6) nuts- high in fiber and protein that increase feelings of fullness 7) grapefruit: rich in nutrients, antioxidants, and fiber (not to be taken with anticoagulation) 8) beans- high in protein and fiber 9) salmon- has high quality protein and healthy fats 10) green tea- rich in polyphenols 11) eggs- rich in choline and vitamin D 12) tuna- high protein, boosts metabolism 13) avocado- decreases visceral abdominal fat 14) chicken (pasture raised): high in protein and iron 15) blueberries- reduce abdominal fat and cholesterol 16) whole grains- decreases calories retained during digestion, speeds metabolism 17) chia seeds- curb appetite 18) chilies- increases fat metabolism  -Discussed supplements that can be used:  1) Metatrim 400mg  BID 30 minutes before breakfast and dinner  2) Sphaeranthus indicus and Garcinia mangostana (combinations of these and #1 can be found in capsicum and zychrome  3) green coffee bean extract 400mg  twice per day or [place]ia (african mango) 150 to 300mg  twice per day.  5) Bladder dysfunction -ccontinue slippery elm since appears to be helping  6) Right leg pain: -VAS [place] ordered and discussed that it is negative for clot -discussed that she read that castor oil could help -discussed that pain is worse -discussed red light therapy -discussed benefits of turmeric -discussed that she is starting therapy next week.  5 minutes spent in discussion of her right leg pain, dicussed that vascular ultrasound was negative for clot, discussed that pain and swelling are worse when she sleeps,  discussed that the pain is located right above her calf

## 2023-05-15 ENCOUNTER — Ambulatory Visit: Payer: 59

## 2023-05-15 NOTE — Therapy (Deleted)
OUTPATIENT PHYSICAL THERAPY LOWER EXTREMITY EVALUATION   Patient Name: Shannon Garza MRN: [id number] DOB:1966-01-24, 57 y.o., female Today's Date: 05/15/2023  END OF SESSION:   Past Medical History:  Diagnosis Date   Diabetes mellitus without complication (HCC)    Hypertension    No past surgical history on file. Patient Active Problem List   Diagnosis Date Noted   History of stroke 01/17/2021   Dyslipidemia 01/17/2021   CVA (cerebral vascular accident) (HCC) 10/18/2020   Primary hypertension    Left arm weakness    Left leg weakness    Subcortical infarction (HCC) 10/12/2020    PCP: Georgina Quint, MD   REFERRING PROVIDER: Horton Chin, MD  REFERRING DIAG: [phone] (ICD-10-CM) - Right leg pain  THERAPY DIAG:  No diagnosis found.  Rationale for Evaluation and Treatment: Rehabilitation  ONSET DATE: ***  SUBJECTIVE:   SUBJECTIVE STATEMENT: ***  PERTINENT HISTORY: 8) RLE pain: -discussed that ultrasound was negative for clot   Pain Inventory Average Pain 7 Pain Right Now 1 My pain is sharp   LOCATION OF PAIN   right ankle area   What time of day is pain worst? Night and first thing in the morning SLEEP      good Pain is worse with walking and when I wake up in the morning Pain improves with massaging the area  Relief: 7 PAIN:  Are you having pain? {OPRCPAIN:27236}  PRECAUTIONS: Fall  RED FLAGS: None   WEIGHT BEARING RESTRICTIONS: No  FALLS:  Has patient fallen in last 6 months? No  OCCUPATION: ***  PLOF: Independent with basic ADLs and Independent with household mobility with device  PATIENT GOALS: ***  NEXT MD VISIT: ***  OBJECTIVE:   DIAGNOSTIC FINDINGS: negative VAS [place]  PATIENT SURVEYS:  FOTO ***  MUSCLE LENGTH: Hamstrings: Right *** deg; Left *** deg Thomas test: Right *** deg; Left *** deg  POSTURE: {posture:25561}  PALPATION: ***  LOWER EXTREMITY ROM:  {AROM/PROM:27142} ROM Right eval Left eval   Hip flexion    Hip extension    Hip abduction    Hip adduction    Hip internal rotation    Hip external rotation    Knee flexion    Knee extension    Ankle dorsiflexion    Ankle plantarflexion    Ankle inversion    Ankle eversion     (Blank rows = not tested)  LOWER EXTREMITY MMT:  MMT Right eval Left eval  Hip flexion    Hip extension    Hip abduction    Hip adduction    Hip internal rotation    Hip external rotation    Knee flexion    Knee extension    Ankle dorsiflexion    Ankle plantarflexion    Ankle inversion    Ankle eversion     (Blank rows = not tested)  LOWER EXTREMITY SPECIAL TESTS:  {LEspecialtests:26242}  FUNCTIONAL TESTS:  {Functional tests:24029}  GAIT: Distance walked: *** Assistive device utilized: {Assistive devices:23999} Level of assistance: {Levels of assistance:24026} Comments: ***   TODAY'S TREATMENT:  DATE: 05/15/23 Eval    PATIENT EDUCATION:  Education details: Discussed eval findings, rehab rationale and POC and patient is in agreement  Person educated: Patient Education method: Explanation Education comprehension: verbalized understanding and needs further education  HOME EXERCISE PROGRAM: ***  ASSESSMENT:  CLINICAL IMPRESSION: Patient is a *** y.o. *** who was seen today for physical therapy evaluation and treatment for ***.   OBJECTIVE IMPAIRMENTS: {opptimpairments:25111}.   ACTIVITY LIMITATIONS: {activitylimitations:27494}  PERSONAL FACTORS: {Personal factors:25162} are also affecting patient's functional outcome.   REHAB POTENTIAL: {rehabpotential:25112}  CLINICAL DECISION MAKING: Evolving/moderate complexity  EVALUATION COMPLEXITY: Moderate   GOALS: Goals reviewed with patient? {yes/no:20286}  SHORT TERM GOALS: Target date: *** *** Baseline: Goal status: INITIAL  2.   *** Baseline:  Goal status: INITIAL  3.  *** Baseline:  Goal status: INITIAL  4.  *** Baseline:  Goal status: INITIAL  5.  *** Baseline:  Goal status: INITIAL  6.  *** Baseline:  Goal status: INITIAL  LONG TERM GOALS: Target date: ***  *** Baseline:  Goal status: INITIAL  2.  *** Baseline:  Goal status: INITIAL  3.  *** Baseline:  Goal status: INITIAL  4.  *** Baseline:  Goal status: INITIAL  5.  *** Baseline:  Goal status: INITIAL  6.  *** Baseline:  Goal status: INITIAL   PLAN:  PT FREQUENCY: 1-2x/week  PT DURATION: 6 weeks  PLANNED INTERVENTIONS: Therapeutic exercises, Therapeutic activity, Neuromuscular re-education, Balance training, Gait training, Patient/Family education, Self Care, Joint mobilization, Stair training, DME instructions, Dry Needling, Electrical stimulation, Cryotherapy, Moist heat, Manual therapy, and Re-evaluation  PLAN FOR NEXT SESSION: HEP review and update, manual techniques as appropriate, aerobic tasks, ROM and flexibility activities, strengthening and PREs, TPDN, gait and balance training as needed     Hildred Laser, PT 05/15/2023, 3:07 PM

## 2023-06-20 ENCOUNTER — Other Ambulatory Visit: Payer: Self-pay | Admitting: Physical Medicine and Rehabilitation

## 2023-06-20 DIAGNOSIS — I10 Essential (primary) hypertension: Secondary | ICD-10-CM

## 2023-07-09 ENCOUNTER — Encounter: Payer: 59 | Admitting: Physical Medicine and Rehabilitation

## 2023-08-02 ENCOUNTER — Other Ambulatory Visit: Payer: Self-pay | Admitting: Physical Medicine and Rehabilitation

## 2023-08-02 DIAGNOSIS — I10 Essential (primary) hypertension: Secondary | ICD-10-CM

## 2023-08-06 ENCOUNTER — Telehealth: Payer: Self-pay | Admitting: Physical Medicine and Rehabilitation

## 2023-08-06 NOTE — Telephone Encounter (Signed)
Patient called in requesting refills on lisinopril , patient uses pharmacy on file

## 2023-08-17 ENCOUNTER — Encounter: Payer: Self-pay | Admitting: Physical Medicine and Rehabilitation

## 2023-08-17 ENCOUNTER — Encounter: Payer: 59 | Attending: Physical Medicine and Rehabilitation | Admitting: Physical Medicine and Rehabilitation

## 2023-08-17 ENCOUNTER — Telehealth: Payer: Self-pay

## 2023-08-17 VITALS — BP 138/83 | HR 67 | Ht 64.0 in | Wt 286.0 lb

## 2023-08-17 DIAGNOSIS — I10 Essential (primary) hypertension: Secondary | ICD-10-CM | POA: Insufficient documentation

## 2023-08-17 DIAGNOSIS — M79605 Pain in left leg: Secondary | ICD-10-CM | POA: Insufficient documentation

## 2023-08-17 DIAGNOSIS — M79641 Pain in right hand: Secondary | ICD-10-CM | POA: Insufficient documentation

## 2023-08-17 MED ORDER — LISINOPRIL 40 MG PO TABS
40.0000 mg | ORAL_TABLET | Freq: Every day | ORAL | 3 refills | Status: DC
Start: 2023-08-17 — End: 2024-05-09

## 2023-08-17 NOTE — Patient Instructions (Signed)
Blue emu oil Voltaren gel Coconut oil Olive oil   1) Ginger (especially studied for arthritis)- reduce leukotriene production to decrease inflammation 2) Blueberries- high in phytonutrients that decrease inflammation 3) Salmon- marine omega-3s reduce joint swelling and pain 4) Pumpkin seeds- reduce inflammation 5) dark chocolate- reduces inflammation 6) turmeric- reduces inflammation 7) tart cherries - reduce pain and stiffness 8) extra virgin olive oil - its compound olecanthal helps to block prostaglandins  9) chili peppers- can be eaten or applied topically via capsaicin 10) mint- helpful for headache, muscle aches, joint pain, and itching 11) garlic- reduces inflammation  Link to further information on diet for chronic pain: [email or web address]

## 2023-08-17 NOTE — Telephone Encounter (Signed)
Patients LOV was 07/28/2021. Patient needs to make an appointment to receive refill

## 2023-08-17 NOTE — Telephone Encounter (Signed)
Copied from CRM [id number]. Topic: Clinical - Medication Refill >> Aug 17, 2023  9:44 AM Cassiday T wrote: Most Recent Primary Care Visit:  Provider: Georgina Quint  Department: [facility]  Visit Type: OFFICE VISIT  Date: 07/28/2021  Medication: lisinopril (ZESTRIL) 40 MG tablet  Has the patient contacted their pharmacy? Yes (Agent: If no, request that the patient contact the pharmacy for the refill. If patient does not wish to contact the pharmacy document the reason why and proceed with request.) (Agent: If yes, when and what did the pharmacy advise?)  Is this the correct pharmacy for this prescription? Yes If no, delete pharmacy and type the correct one.  This is the patient's preferred pharmacy:  [facility] [address], [place] - [address] [address] Phone: [phone] Fax: [phone]   Has the prescription been filled recently? No  Is the patient out of the medication? Yes  Has the patient been seen for an appointment in the last year OR does the patient have an upcoming appointment? Yes  Can we respond through MyChart? No  Agent: Please be advised that Rx refills may take up to 3 business days. We ask that you follow-up with your pharmacy.

## 2023-08-17 NOTE — Progress Notes (Signed)
Subjective:    Patient ID: Shannon Garza, female    DOB: 1966/03/15, 57 y.o.   MRN: [id number]  HPI    Shannon Garza presents for f/u of HTN, weight loss  1) Weakness in left side -she feels overall stronger -makes herself pick up things at home -she feels weaker some times at night -she was concerned regarding whether she was having repeat stroke -she is doing well with walking. -she is still working on hand strengthening. -strength is improving  2) HTN: -has not checked blood pressure athome.  138 systolic/83 diastolic -she has been trying to get off her blood pressure medications -she has been taking Lisinopril 40mg  but not every day -she is not taking amlodipine -she felt some numbness in her mouth yesterday when her BP was elevated -she does not eat kiwi -she continues to take her blood pressure medication -she recently started back on the lisinopril as she was tried on another blood pressure medication and it made her dizziness.  -she is going to get beet root powder  3) obesity: -she started walking yesterday -she does not eat much -she tries to stay off the sweets -she stopped eating a lot -she feels she needs to build up her fruit and vegetable intake -she ate pasta last night and this was ok -she eats salads -she finally got away from her sister-in-law who cooks unhealthy foods.  -she has continued to lose a lot of weight! -she lost 7 lbs since her last visit on 12/1 -she has [place] nuts, cashew nuts.  -she is doing ok with walking, she has not recently since it has been cold.  -she tries not to eat after 6pm -she is thinking of doing a cleanse.  -she caught herself eating more during the holidays.  -she bout a health drink and she finds it calms her down  4) diabetes: has been 82 when she checked at home.  -she been very careful with her diet.  -she has been trying to eat about 2 meals per day.  -she eats Ezekial bread and peanut butter with bananas on  top of it -sometimes she will eat oatmeal of grits. -she uses yellow grits.   5) fatigue:  -sister in law concerned about this and recommended that she postpone her return to work.   6) h/o stroke: -followed yo with Dr. Benita Stabile  7) Bladder dysfunction -she feels she is urinating too much She feels she has too much flow at a time -when her feet swell she goes more frequently -she is getting incontinent at times   8) RLE pain: -discussed that ultrasound was negative for clot  9) LLE pain: -started to hurt 2 months ago  10) Right hand pain -present since stroke   Pain Inventory Average Pain 4 Pain Right Now 0 My pain is intermittent and sharp  In the last 24 hours, has pain interfered with the following? General activity 5 Relation with others 0 Enjoyment of life 0 What TIME of day is your pain at its worst? evening Sleep (in general) Good  Pain is worse with: walking and some activites Pain improves with: therapy/exercise Relief from Meds: 2    Family History  Problem Relation Age of Onset   Sleep apnea Neg Hx    Social History   Socioeconomic History   Marital status: Single    Spouse name: Not on file   Number of children: Not on file   Years of education: Not on file   Highest  education level: Not on file  Occupational History   Not on file  Tobacco Use   Smoking status: Never   Smokeless tobacco: Never  Vaping Use   Vaping status: Never Used  Substance and Sexual Activity   Alcohol use: Not Currently   Drug use: Not Currently   Sexual activity: Not Currently  Other Topics Concern   Not on file  Social History Narrative   Lives with sister in law and brother, and sister in own home   Right Handed   Drinks no caffeine   Social Drivers of Corporate investment banker Strain: Not on file  Food Insecurity: Not on file  Transportation Needs: Not on file  Physical Activity: Not on file  Stress: Not on file  Social Connections: Not on file    No past surgical history on file. Past Medical History:  Diagnosis Date   Diabetes mellitus without complication (HCC)    Hypertension    BP 138/83   Pulse 67   Ht 5\' 4"  (1.626 m)   Wt 286 lb (129.7 kg)   SpO2 99%   BMI 49.09 kg/m   Opioid Risk Score:   Fall Risk Score:  `1  Depression screen [facility] 2/9     08/17/2023    9:41 AM 04/06/2023    9:44 AM 12/19/2022   10:03 AM 09/13/2021   10:54 AM 04/19/2021    9:25 AM 01/17/2021   11:04 AM 12/24/2020   12:52 PM  Depression screen PHQ 2/9  Decreased Interest 0 0 2 0 0 0 0  Down, Depressed, Hopeless 0 0 1 0 0 0 0  PHQ - 2 Score 0 0 3 0 0 0 0    Review of Systems  Constitutional: Negative.   HENT: Negative.    Eyes: Negative.   Respiratory: Negative.    Cardiovascular: Negative.   Gastrointestinal: Negative.   Endocrine: Negative.   Genitourinary: Negative.   Musculoskeletal:  Positive for gait problem.       Pain in right ankle/leg  Skin: Negative.   Allergic/Immunologic: Negative.   Hematological: Negative.   Psychiatric/Behavioral: Negative.    All other systems reviewed and are negative.      Objective:   Physical Exam Not performed    Assessment & Plan:  Shannon Garza is a 57 year old woman who presents for HTN f/u  1) Subcortical infarction -continue HEP -discussed weakness and prognosis -walk 15 minutes per day.  -may restart work on 8/11 -encouraged weight loss.  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   2) Prediabetes: -Reviewed HgbA1c  -will recheck next time -avoid sugar, bread, pasta, rice -avoid snacking -try to incorporate into your diet some of the following foods which are good for diabetes: 1) cinnamon- imitates effects of insulin, increasing glucose transport into cells ([place] or [place] cinnamon is best, least processed) 2) nuts- can slow down the blood sugar response of carbohydrate rich foods 3) oatmeal- contains and  anti-inflammatory compound avenanthramide 4) whole-milk yogurt (best types are no sugar, [place] yogurt, or goat/sheep yogurt) 5) beans- high in protein, fiber, and vitamins, low glycemic index 6) broccoli- great source of vitamin A and C 7) quinoa- higher in protein and fiber than other grains 8) spinach- high in vitamin A, fiber, and protein 9) olive oil- reduces glucose levels, LDL, and triglycerides 10) salmon- excellent amount of omega-3-fatty acids 11) walnuts- rich in antioxidants 12) apples- high in fiber and quercetin  13) carrots- highly nutritious with low impact on blood sugar 14) eggs- improve HDL (good cholesterol), high in protein, keep you satiated 15) turmeric: improves blood sugars, cardiovascular disease, and protects kidney health 16) garlic: improves blood sugar, blood pressure, pain 17) tomatoes: highly nutritious with low impact on blood sugar   3) HTN: -commended on improvements in BP! -continue amlodipine 5mg  daily -discussee starting resistance training -refilled Lisinopril 40mg  daily -discussed clonidine but side effects are not acceptable  -advised eating beets, kiwi, nuts, high potassium fruits weekly -advised to let me know in 2-3 days if BP is not trending down -Advised checking BP daily at home and logging results to bring into follow-up appointment with PCP and myself. -Reviewed BP meds today.  -Advised regarding healthy foods that can help lower blood pressure and provided with a list: 1) citrus foods- high in vitamins and minerals 2) salmon and other fatty fish - reduces inflammation and oxylipins 3) swiss chard (leafy green)- high level of nitrates 4) pumpkin seeds- one of the best natural sources of magnesium 5) Beans and lentils- high in fiber, magnesium, and potassium 6) Berries- high in flavonoids 7) Amaranth (whole grain, can be cooked similarly to rice and oats)- high in magnesium and fiber 8) Pistachios- even more effective at reducing BP than  other nuts 9) Carrots- high in phenolic compounds that relax blood vessels and reduce inflammation 10) Celery- contain phthalides that relax tissues of arterial walls 11) Tomatoes- can also improve cholesterol and reduce risk of heart disease 12) Broccoli- good source of magnesium, calcium, and potassium 13) Greek yogurt: high in potassium and calcium 14) Herbs and spices: Celery seed, cilantro, saffron, lemongrass, black cumin, ginseng, cinnamon, cardamom, sweet basil, and ginger 15) Chia and flax seeds- also help to lower cholesterol and blood sugar 16) Beets- high levels of nitrates that relax blood vessels  17) spinach and bananas- high in potassium  -Provided lise of supplements that can help with hypertension:  1) magnesium: one high quality brand is Bioptemizers since it contains all 7 types of magnesium, otherwise over the counter magnesium gluconate 400mg  is a good option 2) B vitamins 3) vitamin D 4) potassium 5) CoQ10 6) L-arginine 7) Vitamin C 8) Beetroot -Educated that goal BP is 120/80. -Made goal to incorporate some of the above foods into diet.    4) Obesity: -discussed that she does not eat much -recommended replacing morning cereal with yogurt Discussed that she lost 28 lbs! -discussed that she is already eating a very healthy diet -Educated regarding health benefits of weight loss- for pain, general health, chronic disease prevention, immune health, mental health.  -Will monitor weight every visit.  -Consider [place] tea daily.  -Discussed the benefits of intermittent fasting. -Discussed foods that can assist in weight loss: 1) leafy greens- high in fiber and nutrients 2) dark chocolate- improves metabolism (if prefer sweetened, best to sweeten with honey instead of sugar).  3) cruciferous vegetables- high in fiber and protein 4) full fat yogurt: high in healthy fat, protein, calcium, and probiotics 5) apples- high in a variety of phytochemicals 6) nuts- high  in fiber and protein that increase feelings of fullness 7) grapefruit: rich in nutrients, antioxidants, and fiber (not to be taken with anticoagulation) 8) beans- high in protein and fiber 9) salmon- has high quality protein and healthy fats 10) green tea- rich in polyphenols 11) eggs- rich in choline and vitamin D 12) tuna- high protein, boosts metabolism 13) avocado- decreases visceral abdominal fat 14) chicken (  pasture raised): high in protein and iron 15) blueberries- reduce abdominal fat and cholesterol 16) whole grains- decreases calories retained during digestion, speeds metabolism 17) chia seeds- curb appetite 18) chilies- increases fat metabolism  -Discussed supplements that can be used:  1) Metatrim 400mg  BID 30 minutes before breakfast and dinner  2) Sphaeranthus indicus and Garcinia mangostana (combinations of these and #1 can be found in capsicum and zychrome  3) green coffee bean extract 400mg  twice per day or [place] (african mango) 150 to 300mg  twice per day.  5) Bladder dysfunction -ccontinue slippery elm since appears to be helping  6) Right leg pain: resolved  7) LLE pain: -vas [place] ordered -Discussed current symptoms of pain and history of pain.  -Discussed benefits of exercise in reducing pain. -Discussed following foods that may reduce pain: 1) Ginger (especially studied for arthritis)- reduce leukotriene production to decrease inflammation 2) Blueberries- high in phytonutrients that decrease inflammation 3) Salmon- marine omega-3s reduce joint swelling and pain 4) Pumpkin seeds- reduce inflammation 5) dark chocolate- reduces inflammation 6) turmeric- reduces inflammation 7) tart cherries - reduce pain and stiffness 8) extra virgin olive oil - its compound olecanthal helps to block prostaglandins  9) chili peppers- can be eaten or applied topically via capsaicin 10) mint- helpful for headache, muscle aches, joint pain, and itching 11) garlic- reduces  inflammation  Link to further information on diet for chronic pain: [email or web address]   8) Right hand OA:  -recommended blue emu oil, voltaren gel  -prescribed therapy

## 2023-08-21 ENCOUNTER — Other Ambulatory Visit: Payer: Self-pay | Admitting: Physical Medicine and Rehabilitation

## 2023-08-21 DIAGNOSIS — R29898 Other symptoms and signs involving the musculoskeletal system: Secondary | ICD-10-CM

## 2023-08-27 ENCOUNTER — Ambulatory Visit (HOSPITAL_COMMUNITY): Payer: 59

## 2023-08-30 NOTE — Therapy (Deleted)
 OUTPATIENT OCCUPATIONAL THERAPY NEURO EVALUATION  Patient Name: Shannon Garza MRN: [id number] DOB:1966/01/16, 58 y.o., female Today's Date: 08/30/2023  PCP: Purcell Emil Schanz, MD  REFERRING PROVIDER: Lorilee Sven SQUIBB, MD   END OF SESSION:   Past Medical History:  Diagnosis Date   Diabetes mellitus without complication (HCC)    Hypertension    No past surgical history on file. Patient Active Problem List   Diagnosis Date Noted   History of stroke 01/17/2021   Dyslipidemia 01/17/2021   CVA (cerebral vascular accident) (HCC) 10/18/2020   Primary hypertension    Left arm weakness    Left leg weakness    Subcortical infarction (HCC) 10/12/2020    ONSET DATE: 08/21/23 (referral date), CVA 10/12/20 - chronic   REFERRING DIAG: R29.898 (ICD-10-CM) - Left arm weakness   THERAPY DIAG:  No diagnosis found.  Rationale for Evaluation and Treatment: Rehabilitation  SUBJECTIVE:   SUBJECTIVE STATEMENT: ***  Pt accompanied by: {accompnied:27141}  PERTINENT HISTORY: HTN, subcortical infarction (2022), prediabetes  Pt was previously seen by OT to address L arm weakness in November 2023. Per 07/19/22 Outpt OT Treatment Note: Chronic stroke from Feb 2022. She was seen here in May 2022, and just finished course of 4 PT visits in Sept 2023 for L shoulder pain.  PRECAUTIONS: {Therapy precautions:24002}  WEIGHT BEARING RESTRICTIONS: {Yes ***/No:24003}  PAIN:  Are you having pain? {OPRCPAIN:27236}  FALLS: Has patient fallen in last 6 months? {fallsyesno:27318}  LIVING ENVIRONMENT: Lives with: {OPRC lives with:25569::lives with their family} Lives in: {Lives in:25570} Stairs: {opstairs:27293} Has following equipment at home: {Assistive devices:23999}  PLOF: {PLOF:24004}  PATIENT GOALS: ***  OBJECTIVE:  Note: Objective measures were completed at Evaluation unless otherwise noted.  HAND DOMINANCE: {MISC; OT HAND DOMINANCE:[phone]}  ADLs: Overall ADLs:  *** Transfers/ambulation related to ADLs: Eating: *** Grooming: *** UB Dressing: *** LB Dressing: *** Toileting: *** Bathing: *** Tub Shower transfers: *** Equipment: {equipment:25573}  IADLs: Shopping: *** Light housekeeping: *** Meal Prep: *** Community mobility: *** Medication management: *** Financial management: *** Handwriting: {OTWRITTENEXPRESSION:25361}  MOBILITY STATUS: {OTMOBILITY:25360}  POSTURE COMMENTS:  {posture:25561} Sitting balance: {sitting balance:25483}  ACTIVITY TOLERANCE: Activity tolerance: ***  FUNCTIONAL OUTCOME MEASURES: {OTFUNCTIONALMEASURES:27238}  UPPER EXTREMITY ROM:    {AROM/PROM:27142} ROM Right eval Left eval  Shoulder flexion    Shoulder abduction    Shoulder adduction    Shoulder extension    Shoulder internal rotation    Shoulder external rotation    Elbow flexion    Elbow extension    Wrist flexion    Wrist extension    Wrist ulnar deviation    Wrist radial deviation    Wrist pronation    Wrist supination    (Blank rows = not tested)  UPPER EXTREMITY MMT:     MMT Right eval Left eval  Shoulder flexion    Shoulder abduction    Shoulder adduction    Shoulder extension    Shoulder internal rotation    Shoulder external rotation    Middle trapezius    Lower trapezius    Elbow flexion    Elbow extension    Wrist flexion    Wrist extension    Wrist ulnar deviation    Wrist radial deviation    Wrist pronation    Wrist supination    (Blank rows = not tested)  HAND FUNCTION: {handfunction:27230}  COORDINATION: {otcoordination:27237}  SENSATION: {sensation:27233}  EDEMA: ***  MUSCLE TONE: {UETONE:25567}  COGNITION: Overall cognitive status: {cognition:24006}  VISION: Subjective report: *** Baseline vision: {  OTBASELINEVISION:25363} Visual history: {OTVISUALHISTORY:25364}  VISION ASSESSMENT: {visionassessment:27231}  Patient has difficulty with following activities due to following visual  impairments: ***  PERCEPTION: {Perception:25564}  PRAXIS: {Praxis:25565}  OBSERVATIONS: ***                                                                                                                             TREATMENT DATE: ***         PATIENT EDUCATION: Education details: *** Person educated: {Person educated:25204} Education method: {Education Method:25205} Education comprehension: {Education Comprehension:25206}  HOME EXERCISE PROGRAM: ***   GOALS: Goals reviewed with patient? {yes/no:20286}  SHORT TERM GOALS: Target date: ***  *** Baseline: Goal status: INITIAL  2.  *** Baseline:  Goal status: INITIAL  3.  *** Baseline:  Goal status: INITIAL  4.  *** Baseline:  Goal status: INITIAL  5.  *** Baseline:  Goal status: INITIAL  6.  *** Baseline:  Goal status: INITIAL  LONG TERM GOALS: Target date: ***  *** Baseline:  Goal status: INITIAL  2.  *** Baseline:  Goal status: INITIAL  3.  *** Baseline:  Goal status: INITIAL  4.  *** Baseline:  Goal status: INITIAL  5.  *** Baseline:  Goal status: INITIAL  6.  *** Baseline:  Goal status: INITIAL  ASSESSMENT:  CLINICAL IMPRESSION: Patient is a *** y.o. *** who was seen today for occupational therapy evaluation for ***.   PERFORMANCE DEFICITS: in functional skills including {OT physical skills:25468}, cognitive skills including {OT cognitive skills:25469}, and psychosocial skills including {OT psychosocial skills:25470}.   IMPAIRMENTS: are limiting patient from {OT performance deficits:25471}.   CO-MORBIDITIES: {Comorbidities:25485} that affects occupational performance. Patient will benefit from skilled OT to address above impairments and improve overall function.  MODIFICATION OR ASSISTANCE TO COMPLETE EVALUATION: {OT modification:25474}  OT OCCUPATIONAL PROFILE AND HISTORY: {OT PROFILE AND HISTORY:25484}  CLINICAL DECISION MAKING: {OT CDM:25475}  REHAB POTENTIAL:  {rehabpotential:25112}  EVALUATION COMPLEXITY: {Evaluation complexity:25115}    PLAN:  OT FREQUENCY: {rehab frequency:25116}  OT DURATION: {rehab duration:25117}  PLANNED INTERVENTIONS: {OT Interventions:25467}  RECOMMENDED OTHER SERVICES: ***  CONSULTED AND AGREED WITH PLAN OF CARE: {ENR:74513}  PLAN FOR NEXT SESSION: ***   Geofm FORBES Coder, OT 08/30/2023, 12:00 PM

## 2023-08-31 ENCOUNTER — Ambulatory Visit: Payer: 59 | Admitting: Occupational Therapy

## 2023-09-04 ENCOUNTER — Ambulatory Visit (HOSPITAL_COMMUNITY): Payer: 59 | Attending: Physical Medicine and Rehabilitation

## 2023-09-05 ENCOUNTER — Encounter: Payer: Self-pay | Admitting: Emergency Medicine

## 2023-09-05 ENCOUNTER — Ambulatory Visit: Payer: 59 | Admitting: Emergency Medicine

## 2023-09-05 VITALS — BP 180/78 | HR 51 | Temp 98.1°F | Ht 64.0 in | Wt 253.0 lb

## 2023-09-05 DIAGNOSIS — I10 Essential (primary) hypertension: Secondary | ICD-10-CM | POA: Diagnosis not present

## 2023-09-05 DIAGNOSIS — E785 Hyperlipidemia, unspecified: Secondary | ICD-10-CM | POA: Diagnosis not present

## 2023-09-05 DIAGNOSIS — Z1231 Encounter for screening mammogram for malignant neoplasm of breast: Secondary | ICD-10-CM

## 2023-09-05 DIAGNOSIS — Z0000 Encounter for general adult medical examination without abnormal findings: Secondary | ICD-10-CM

## 2023-09-05 DIAGNOSIS — Z1329 Encounter for screening for other suspected endocrine disorder: Secondary | ICD-10-CM

## 2023-09-05 DIAGNOSIS — Z8673 Personal history of transient ischemic attack (TIA), and cerebral infarction without residual deficits: Secondary | ICD-10-CM

## 2023-09-05 DIAGNOSIS — Z0001 Encounter for general adult medical examination with abnormal findings: Secondary | ICD-10-CM

## 2023-09-05 DIAGNOSIS — Z130 Encounter for screening for diseases of the blood and blood-forming organs and certain disorders involving the immune mechanism: Secondary | ICD-10-CM | POA: Diagnosis not present

## 2023-09-05 DIAGNOSIS — Z13228 Encounter for screening for other metabolic disorders: Secondary | ICD-10-CM | POA: Diagnosis not present

## 2023-09-05 DIAGNOSIS — Z1211 Encounter for screening for malignant neoplasm of colon: Secondary | ICD-10-CM

## 2023-09-05 LAB — HEMOGLOBIN A1C: Hgb A1c MFr Bld: 5.6 % (ref 4.6–6.5)

## 2023-09-05 LAB — LIPID PANEL
Cholesterol: 205 mg/dL — ABNORMAL HIGH (ref 0–200)
HDL: 57 mg/dL (ref >39.00)
LDL Cholesterol: 133 mg/dL — ABNORMAL HIGH (ref 0–99)
NonHDL: 148.25
Total CHOL/HDL Ratio: 4
Triglycerides: 75 mg/dL (ref 0.0–149.0)
VLDL: 15 mg/dL (ref 0.0–40.0)

## 2023-09-05 LAB — CBC WITH DIFFERENTIAL/PLATELET
Basophils Absolute: 0 10*3/uL (ref 0.0–0.1)
Basophils Relative: 0.8 % (ref 0.0–3.0)
Eosinophils Absolute: 0.1 10*3/uL (ref 0.0–0.7)
Eosinophils Relative: 2.4 % (ref 0.0–5.0)
HCT: 41.7 % (ref 36.0–46.0)
Hemoglobin: 13.3 g/dL (ref 12.0–15.0)
Lymphocytes Relative: 28.1 % (ref 12.0–46.0)
Lymphs Abs: 1.4 10*3/uL (ref 0.7–4.0)
MCHC: 32 g/dL (ref 30.0–36.0)
MCV: 83.6 fL (ref 78.0–100.0)
Monocytes Absolute: 0.4 10*3/uL (ref 0.1–1.0)
Monocytes Relative: 8.4 % (ref 3.0–12.0)
Neutro Abs: 3 10*3/uL (ref 1.4–7.7)
Neutrophils Relative %: 60.3 % (ref 43.0–77.0)
Platelets: 163 10*3/uL (ref 150.0–400.0)
RBC: 4.98 Mil/uL (ref 3.87–5.11)
RDW: 13.7 % (ref 11.5–15.5)
WBC: 5 10*3/uL (ref 4.0–10.5)

## 2023-09-05 LAB — COMPREHENSIVE METABOLIC PANEL
ALT: 11 U/L (ref 0–35)
AST: 19 U/L (ref 0–37)
Albumin: 4.2 g/dL (ref 3.5–5.2)
Alkaline Phosphatase: 84 U/L (ref 39–117)
BUN: 15 mg/dL (ref 6–23)
CO2: 30 meq/L (ref 19–32)
Calcium: 10.5 mg/dL (ref 8.4–10.5)
Chloride: 106 meq/L (ref 96–112)
Creatinine, Ser: 0.67 mg/dL (ref 0.40–1.20)
GFR: 97.76 mL/min (ref >60.00)
Glucose, Bld: 85 mg/dL (ref 70–99)
Potassium: 4.3 meq/L (ref 3.5–5.1)
Sodium: 140 meq/L (ref 135–145)
Total Bilirubin: 0.4 mg/dL (ref 0.2–1.2)
Total Protein: 6.9 g/dL (ref 6.0–8.3)

## 2023-09-05 MED ORDER — ROSUVASTATIN CALCIUM 10 MG PO TABS
10.0000 mg | ORAL_TABLET | Freq: Every day | ORAL | 3 refills | Status: AC
Start: 2023-09-05 — End: ?

## 2023-09-05 NOTE — Progress Notes (Signed)
 Shannon Garza 58 y.o.   Chief Complaint  Patient presents with   Annual Exam    Patient states she's been constipated and has been going on for awhile.     HISTORY OF PRESENT ILLNESS: This is a 58 y.o. female here for annual exam History of hypertension History of stroke x 2 in the past Occasional constipation No other complaints or medical concerns today  HPI   Prior to Admission medications   Medication Sig Start Date End Date Taking? Authorizing Provider  acetaminophen  (TYLENOL ) 325 MG tablet Take 2 tablets (650 mg total) by mouth every 4 (four) hours as needed for mild pain (or temp > 37.5 C (99.5 F)). 10/18/20  Yes Delores Suzann HERO, MD  amLODipine  (NORVASC ) 5 MG tablet Take 1 tablet (5 mg total) by mouth daily. 09/05/22 09/05/23 Yes Raulkar, Sven SQUIBB, MD  b complex vitamins capsule Take 1 capsule by mouth daily. 01/12/21  Yes Raulkar, Sven SQUIBB, MD  lisinopril  (ZESTRIL ) 40 MG tablet Take 1 tablet (40 mg total) by mouth daily. 08/17/23  Yes Raulkar, Sven SQUIBB, MD  Turmeric (QC TUMERIC COMPLEX PO) Take 1 tablet by mouth daily.   Yes [provider]  Vitamin D , Ergocalciferol , (DRISDOL ) 1.25 MG (50000 UNIT) CAPS capsule Take 1 capsule (50,000 Units total) by mouth every 7 (seven) days. 05/04/21  Yes Raulkar, Sven SQUIBB, MD    No Known Allergies  Patient Active Problem List   Diagnosis Date Noted   History of stroke 01/17/2021   Dyslipidemia 01/17/2021   CVA (cerebral vascular accident) (HCC) 10/18/2020   Primary hypertension    Subcortical infarction (HCC) 10/12/2020    Past Medical History:  Diagnosis Date   Diabetes mellitus without complication (HCC)    Hypertension     No past surgical history on file.  Social History   Socioeconomic History   Marital status: Single    Spouse name: Not on file   Number of children: Not on file   Years of education: Not on file   Highest education level: Not on file  Occupational History   Not on file  Tobacco Use    Smoking status: Never   Smokeless tobacco: Never  Vaping Use   Vaping status: Never Used  Substance and Sexual Activity   Alcohol use: Not Currently   Drug use: Not Currently   Sexual activity: Not Currently  Other Topics Concern   Not on file  Social History Narrative   Lives with sister in law and brother, and sister in own home   Right Handed   Drinks no caffeine   Social Drivers of Corporate Investment [employer]er Strain: Not on file  Food Insecurity: Not on file  Transportation Needs: Not on file  Physical Activity: Not on file  Stress: Not on file  Social Connections: Not on file  Intimate Partner Violence: Not on file    Family History  Problem Relation Age of Onset   Sleep apnea Neg Hx      Review of Systems  Constitutional: Negative.  Negative for chills and fever.  HENT: Negative.  Negative for congestion and sore throat.   Respiratory: Negative.  Negative for cough and shortness of breath.   Cardiovascular:  Negative for chest pain and palpitations.  Gastrointestinal:  Positive for constipation.  Genitourinary: Negative.  Negative for dysuria and hematuria.  Skin: Negative.  Negative for rash.  Neurological: Negative.  Negative for dizziness and headaches.  All other systems reviewed and are negative.  Vitals:   09/05/23 1430  BP: (!) 180/78  Pulse: (!) 51  Temp: 98.1 F (36.7 C)  SpO2: 100%    Physical Exam Constitutional:      Appearance: Normal appearance. She is obese.  HENT:     Head: Normocephalic.     Right Ear: Tympanic membrane, ear canal and external ear normal.     Left Ear: Tympanic membrane, ear canal and external ear normal.     Mouth/Throat:     Mouth: Mucous membranes are moist.     Pharynx: Oropharynx is clear.  Eyes:     Extraocular Movements: Extraocular movements intact.     Conjunctiva/sclera: Conjunctivae normal.     Pupils: Pupils are equal, round, and reactive to light.  Cardiovascular:     Rate and Rhythm: Normal  rate and regular rhythm.     Heart sounds: Murmur heard.  Pulmonary:     Effort: Pulmonary effort is normal.     Breath sounds: Normal breath sounds.  Abdominal:     Palpations: Abdomen is soft.     Tenderness: There is no abdominal tenderness.  Musculoskeletal:     Cervical back: No tenderness.  Lymphadenopathy:     Cervical: No cervical adenopathy.  Skin:    General: Skin is warm and dry.     Capillary Refill: Capillary refill takes less than 2 seconds.  Neurological:     General: No focal deficit present.     Mental Status: She is alert and oriented to person, place, and time.  Psychiatric:        Mood and Affect: Mood normal.        Behavior: Behavior normal.      ASSESSMENT & PLAN: Problem List Items Addressed This Visit       Cardiovascular and Mediastinum   Primary hypertension   Elevated blood pressure in the office today.  Normal readings at home.  Did not take medication today. Normally takes amlodipine  5 mg with lisinopril  40 mg daily Cardiovascular risks associated with uncontrolled hypertension discussed Dietary approaches to stop hypertension discussed Benefits of exercise discussed      Relevant Medications   rosuvastatin  (CRESTOR ) 10 MG tablet   Other Relevant Orders   CBC with Differential/Platelet   Comprehensive metabolic panel   Lipid panel   Hemoglobin A1c     Other   History of stroke   Secondary prevention measures discussed with patient. Blood pressure and cholesterol control emphasized. Continue daily baby aspirin . Fall precautions given.      Dyslipidemia   Not presently taking any medication Recommend to start rosuvastatin  10 mg daily      Relevant Medications   rosuvastatin  (CRESTOR ) 10 MG tablet   Other Relevant Orders   Lipid panel   Other Visit Diagnoses       Encounter for general adult medical examination with abnormal findings    -  Primary   Relevant Orders   CBC with Differential/Platelet   Comprehensive  metabolic panel   Lipid panel   Hemoglobin A1c     Screening for deficiency anemia       Relevant Orders   CBC with Differential/Platelet     Screening for endocrine, metabolic and immunity disorder       Relevant Orders   Comprehensive metabolic panel   Hemoglobin A1c     Screening for colon cancer       Relevant Orders   Ambulatory referral to Gastroenterology     Screening mammogram for breast cancer  Relevant Orders   MM 3D SCREENING MAMMOGRAM BILATERAL BREAST        Modifiable risk factors discussed with patient. Anticipatory guidance according to age provided. The following topics were also discussed: Social Determinants of Health Smoking.  Non-smoker Diet and nutrition Benefits of exercise Cancer screening and need for breast cancer and colon cancer screening Vaccinations review and recommendations Cardiovascular risk assessment Review of multiple chronic medical conditions under management Review of all medications Mental health including depression and anxiety Fall and accident prevention  Patient Instructions  Hypertension, Adult High blood pressure (hypertension) is when the force of blood pumping through the arteries is too strong. The arteries are the blood vessels that carry blood from the heart throughout the body. Hypertension forces the heart to work harder to pump blood and may cause arteries to become narrow or stiff. Untreated or uncontrolled hypertension can lead to a heart attack, heart failure, a stroke, kidney disease, and other problems. A blood pressure reading consists of a higher number over a lower number. Ideally, your blood pressure should be below 120/80. The first (top) number is called the systolic pressure. It is a measure of the pressure in your arteries as your heart beats. The second (bottom) number is called the diastolic pressure. It is a measure of the pressure in your arteries as the heart relaxes. What are the causes? The  exact cause of this condition is not known. There are some conditions that result in high blood pressure. What increases the risk? Certain factors may make you more likely to develop high blood pressure. Some of these risk factors are under your control, including: Smoking. Not getting enough exercise or physical activity. Being overweight. Having too much fat, sugar, calories, or salt (sodium) in your diet. Drinking too much alcohol. Other risk factors include: Having a personal history of heart disease, diabetes, high cholesterol, or kidney disease. Stress. Having a family history of high blood pressure and high cholesterol. Having obstructive sleep apnea. Age. The risk increases with age. What are the signs or symptoms? High blood pressure may not cause symptoms. Very high blood pressure (hypertensive crisis) may cause: Headache. Fast or irregular heartbeats (palpitations). Shortness of breath. Nosebleed. Nausea and vomiting. Vision changes. Severe chest pain, dizziness, and seizures. How is this diagnosed? This condition is diagnosed by measuring your blood pressure while you are seated, with your arm resting on a flat surface, your legs uncrossed, and your feet flat on the floor. The cuff of the blood pressure monitor will be placed directly against the skin of your upper arm at the level of your heart. Blood pressure should be measured at least twice using the same arm. Certain conditions can cause a difference in blood pressure between your right and left arms. If you have a high blood pressure reading during one visit or you have normal blood pressure with other risk factors, you may be asked to: Return on a different day to have your blood pressure checked again. Monitor your blood pressure at home for 1 week or longer. If you are diagnosed with hypertension, you may have other blood or imaging tests to help your health care provider understand your overall risk for other  conditions. How is this treated? This condition is treated by making healthy lifestyle changes, such as eating healthy foods, exercising more, and reducing your alcohol intake. You may be referred for counseling on a healthy diet and physical activity. Your health care provider may prescribe medicine if lifestyle changes are  not enough to get your blood pressure under control and if: Your systolic blood pressure is above 130. Your diastolic blood pressure is above 80. Your personal target blood pressure may vary depending on your medical conditions, your age, and other factors. Follow these instructions at home: Eating and drinking  Eat a diet that is high in fiber and potassium, and low in sodium, added sugar, and fat. An example of this eating plan is called the DASH diet. DASH stands for Dietary Approaches to Stop Hypertension. To eat this way: Eat plenty of fresh fruits and vegetables. Try to fill one half of your plate at each meal with fruits and vegetables. Eat whole grains, such as whole-wheat pasta, brown rice, or whole-grain bread. Fill about one fourth of your plate with whole grains. Eat or drink low-fat dairy products, such as skim milk or low-fat yogurt. Avoid fatty cuts of meat, processed or cured meats, and poultry with skin. Fill about one fourth of your plate with lean proteins, such as fish, chicken without skin, beans, eggs, or tofu. Avoid pre-made and processed foods. These tend to be higher in sodium, added sugar, and fat. Reduce your daily sodium intake. Many people with hypertension should eat less than 1,500 mg of sodium a day. Do not drink alcohol if: Your health care provider tells you not to drink. You are pregnant, may be pregnant, or are planning to become pregnant. If you drink alcohol: Limit how much you have to: 0-1 drink a day for women. 0-2 drinks a day for men. Know how much alcohol is in your drink. In the [place] one drink equals one 12 oz bottle of beer  (355 mL), one 5 oz glass of wine (148 mL), or one 1 oz glass of hard liquor (44 mL). Lifestyle  Work with your health care provider to maintain a healthy body weight or to lose weight. Ask what an ideal weight is for you. Get at least 30 minutes of exercise that causes your heart to beat faster (aerobic exercise) most days of the week. Activities may include walking, swimming, or biking. Include exercise to strengthen your muscles (resistance exercise), such as Pilates or lifting weights, as part of your weekly exercise routine. Try to do these types of exercises for 30 minutes at least 3 days a week. Do not use any products that contain nicotine or tobacco. These products include cigarettes, chewing tobacco, and vaping devices, such as e-cigarettes. If you need help quitting, ask your health care provider. Monitor your blood pressure at home as told by your health care provider. Keep all follow-up visits. This is important. Medicines Take over-the-counter and prescription medicines only as told by your health care provider. Follow directions carefully. Blood pressure medicines must be taken as prescribed. Do not skip doses of blood pressure medicine. Doing this puts you at risk for problems and can make the medicine less effective. Ask your health care provider about side effects or reactions to medicines that you should watch for. Contact a health care provider if you: Think you are having a reaction to a medicine you are taking. Have headaches that keep coming back (recurring). Feel dizzy. Have swelling in your ankles. Have trouble with your vision. Get help right away if you: Develop a severe headache or confusion. Have unusual weakness or numbness. Feel faint. Have severe pain in your chest or abdomen. Vomit repeatedly. Have trouble breathing. These symptoms may be an emergency. Get help right away. Call 911. Do not wait to  see if the symptoms will go away. Do not drive yourself to  the hospital. Summary Hypertension is when the force of blood pumping through your arteries is too strong. If this condition is not controlled, it may put you at risk for serious complications. Your personal target blood pressure may vary depending on your medical conditions, your age, and other factors. For most people, a normal blood pressure is less than 120/80. Hypertension is treated with lifestyle changes, medicines, or a combination of both. Lifestyle changes include losing weight, eating a healthy, low-sodium diet, exercising more, and limiting alcohol. This information is not intended to replace advice given to you by your health care provider. Make sure you discuss any questions you have with your health care provider. Document Revised: 06/21/2021 Document Reviewed: 06/21/2021 Elsevier Patient Education  2024 [employer]    Emil Schaumann, MD [facility]

## 2023-09-05 NOTE — Assessment & Plan Note (Signed)
 Not presently taking any medication Recommend to start rosuvastatin 10 mg daily

## 2023-09-05 NOTE — Patient Instructions (Signed)
 Hypertension, Adult High blood pressure (hypertension) is when the force of blood pumping through the arteries is too strong. The arteries are the blood vessels that carry blood from the heart throughout the body. Hypertension forces the heart to work harder to pump blood and may cause arteries to become narrow or stiff. Untreated or uncontrolled hypertension can lead to a heart attack, heart failure, a stroke, kidney disease, and other problems. A blood pressure reading consists of a higher number over a lower number. Ideally, your blood pressure should be below 120/80. The first ("top") number is called the systolic pressure. It is a measure of the pressure in your arteries as your heart beats. The second ("bottom") number is called the diastolic pressure. It is a measure of the pressure in your arteries as the heart relaxes. What are the causes? The exact cause of this condition is not known. There are some conditions that result in high blood pressure. What increases the risk? Certain factors may make you more likely to develop high blood pressure. Some of these risk factors are under your control, including: Smoking. Not getting enough exercise or physical activity. Being overweight. Having too much fat, sugar, calories, or salt (sodium) in your diet. Drinking too much alcohol. Other risk factors include: Having a personal history of heart disease, diabetes, high cholesterol, or kidney disease. Stress. Having a family history of high blood pressure and high cholesterol. Having obstructive sleep apnea. Age. The risk increases with age. What are the signs or symptoms? High blood pressure may not cause symptoms. Very high blood pressure (hypertensive crisis) may cause: Headache. Fast or irregular heartbeats (palpitations). Shortness of breath. Nosebleed. Nausea and vomiting. Vision changes. Severe chest pain, dizziness, and seizures. How is this diagnosed? This condition is diagnosed by  measuring your blood pressure while you are seated, with your arm resting on a flat surface, your legs uncrossed, and your feet flat on the floor. The cuff of the blood pressure monitor will be placed directly against the skin of your upper arm at the level of your heart. Blood pressure should be measured at least twice using the same arm. Certain conditions can cause a difference in blood pressure between your right and left arms. If you have a high blood pressure reading during one visit or you have normal blood pressure with other risk factors, you may be asked to: Return on a different day to have your blood pressure checked again. Monitor your blood pressure at home for 1 week or longer. If you are diagnosed with hypertension, you may have other blood or imaging tests to help your health care provider understand your overall risk for other conditions. How is this treated? This condition is treated by making healthy lifestyle changes, such as eating healthy foods, exercising more, and reducing your alcohol intake. You may be referred for counseling on a healthy diet and physical activity. Your health care provider may prescribe medicine if lifestyle changes are not enough to get your blood pressure under control and if: Your systolic blood pressure is above 130. Your diastolic blood pressure is above 80. Your personal target blood pressure may vary depending on your medical conditions, your age, and other factors. Follow these instructions at home: Eating and drinking  Eat a diet that is high in fiber and potassium, and low in sodium, added sugar, and fat. An example of this eating plan is called the DASH diet. DASH stands for Dietary Approaches to Stop Hypertension. To eat this way: Eat  plenty of fresh fruits and vegetables. Try to fill one half of your plate at each meal with fruits and vegetables. Eat whole grains, such as whole-wheat pasta, brown rice, or whole-grain bread. Fill about one  fourth of your plate with whole grains. Eat or drink low-fat dairy products, such as skim milk or low-fat yogurt. Avoid fatty cuts of meat, processed or cured meats, and poultry with skin. Fill about one fourth of your plate with lean proteins, such as fish, chicken without skin, beans, eggs, or tofu. Avoid pre-made and processed foods. These tend to be higher in sodium, added sugar, and fat. Reduce your daily sodium intake. Many people with hypertension should eat less than 1,500 mg of sodium a day. Do not drink alcohol if: Your health care provider tells you not to drink. You are pregnant, may be pregnant, or are planning to become pregnant. If you drink alcohol: Limit how much you have to: 0-1 drink a day for women. 0-2 drinks a day for men. Know how much alcohol is in your drink. In the [place] one drink equals one 12 oz bottle of beer (355 mL), one 5 oz glass of wine (148 mL), or one 1 oz glass of hard liquor (44 mL). Lifestyle  Work with your health care provider to maintain a healthy body weight or to lose weight. Ask what an ideal weight is for you. Get at least 30 minutes of exercise that causes your heart to beat faster (aerobic exercise) most days of the week. Activities may include walking, swimming, or biking. Include exercise to strengthen your muscles (resistance exercise), such as Pilates or lifting weights, as part of your weekly exercise routine. Try to do these types of exercises for 30 minutes at least 3 days a week. Do not use any products that contain nicotine or tobacco. These products include cigarettes, chewing tobacco, and vaping devices, such as e-cigarettes. If you need help quitting, ask your health care provider. Monitor your blood pressure at home as told by your health care provider. Keep all follow-up visits. This is important. Medicines Take over-the-counter and prescription medicines only as told by your health care provider. Follow directions carefully. Blood  pressure medicines must be taken as prescribed. Do not skip doses of blood pressure medicine. Doing this puts you at risk for problems and can make the medicine less effective. Ask your health care provider about side effects or reactions to medicines that you should watch for. Contact a health care provider if you: Think you are having a reaction to a medicine you are taking. Have headaches that keep coming back (recurring). Feel dizzy. Have swelling in your ankles. Have trouble with your vision. Get help right away if you: Develop a severe headache or confusion. Have unusual weakness or numbness. Feel faint. Have severe pain in your chest or abdomen. Vomit repeatedly. Have trouble breathing. These symptoms may be an emergency. Get help right away. Call 911. Do not wait to see if the symptoms will go away. Do not drive yourself to the hospital. Summary Hypertension is when the force of blood pumping through your arteries is too strong. If this condition is not controlled, it may put you at risk for serious complications. Your personal target blood pressure may vary depending on your medical conditions, your age, and other factors. For most people, a normal blood pressure is less than 120/80. Hypertension is treated with lifestyle changes, medicines, or a combination of both. Lifestyle changes include losing weight, eating a healthy,  low-sodium diet, exercising more, and limiting alcohol. This information is not intended to replace advice given to you by your health care provider. Make sure you discuss any questions you have with your health care provider. Document Revised: 06/21/2021 Document Reviewed: 06/21/2021 Elsevier Patient Education  2024 [employer].

## 2023-09-05 NOTE — Assessment & Plan Note (Signed)
Secondary prevention measures discussed with patient. Blood pressure and cholesterol control emphasized. Continue daily baby aspirin. Fall precautions given.

## 2023-09-05 NOTE — Assessment & Plan Note (Signed)
 Elevated blood pressure in the office today.  Normal readings at home.  Did not take medication today. Normally takes amlodipine  5 mg with lisinopril  40 mg daily Cardiovascular risks associated with uncontrolled hypertension discussed Dietary approaches to stop hypertension discussed Benefits of exercise discussed

## 2023-09-07 ENCOUNTER — Ambulatory Visit: Payer: 59 | Admitting: Occupational Therapy

## 2023-09-14 ENCOUNTER — Ambulatory Visit: Payer: 59 | Admitting: Occupational Therapy

## 2023-09-14 NOTE — Therapy (Deleted)
OUTPATIENT OCCUPATIONAL THERAPY NEURO EVALUATION  Patient Name: Shannon Garza MRN: [id number] DOB:03-28-1966, 58 y.o., female Today's Date: 09/14/2023  PCP: Georgina Quint, MD  REFERRING PROVIDER: Horton Chin, MD   END OF SESSION:   Past Medical History:  Diagnosis Date   Diabetes mellitus without complication (HCC)    Hypertension    No past surgical history on file. Patient Active Problem List   Diagnosis Date Noted   History of stroke 01/17/2021   Dyslipidemia 01/17/2021   Primary hypertension     ONSET DATE: 08/21/23 (referral date), CVA 10/12/20 - chronic   REFERRING DIAG: R29.898 (ICD-10-CM) - Left arm weakness   THERAPY DIAG:  No diagnosis found.  Rationale for Evaluation and Treatment: Rehabilitation  SUBJECTIVE:   SUBJECTIVE STATEMENT: ***  Pt accompanied by: {accompnied:27141}  PERTINENT HISTORY: HTN, subcortical infarction (2022), prediabetes  Pt was previously seen by OT to address L arm weakness in November 2023. Per 07/19/22 Outpt OT Treatment Note: "Chronic stroke from Feb 2022. She was seen here in May 2022, and just finished course of 4 PT visits in Sept 2023 for L shoulder pain."  PRECAUTIONS: {Therapy precautions:24002}  WEIGHT BEARING RESTRICTIONS: {Yes ***/No:24003}  PAIN:  Are you having pain? {OPRCPAIN:27236}  FALLS: Has patient fallen in last 6 months? {fallsyesno:27318}  LIVING ENVIRONMENT: Lives with: {OPRC lives with:25569::"lives with their family"} Lives in: {Lives in:25570} Stairs: {opstairs:27293} Has following equipment at home: {Assistive devices:23999}  PLOF: {PLOF:24004}  PATIENT GOALS: ***  OBJECTIVE:  Note: Objective measures were completed at Evaluation unless otherwise noted.  HAND DOMINANCE: {MISC; OT HAND DOMINANCE:[phone]}  ADLs: Overall ADLs: *** Transfers/ambulation related to ADLs: Eating: *** Grooming: *** UB Dressing: *** LB Dressing: *** Toileting: *** Bathing: *** Tub  Shower transfers: *** Equipment: {equipment:25573}  IADLs: Shopping: *** Light housekeeping: *** Meal Prep: *** Community mobility: *** Medication management: *** Financial management: *** Handwriting: {OTWRITTENEXPRESSION:25361}  MOBILITY STATUS: {OTMOBILITY:25360}  POSTURE COMMENTS:  {posture:25561} Sitting balance: {sitting balance:25483}  ACTIVITY TOLERANCE: Activity tolerance: ***  FUNCTIONAL OUTCOME MEASURES: {OTFUNCTIONALMEASURES:27238}  UPPER EXTREMITY ROM:    {AROM/PROM:27142} ROM Right eval Left eval  Shoulder flexion    Shoulder abduction    Shoulder adduction    Shoulder extension    Shoulder internal rotation    Shoulder external rotation    Elbow flexion    Elbow extension    Wrist flexion    Wrist extension    Wrist ulnar deviation    Wrist radial deviation    Wrist pronation    Wrist supination    (Blank rows = not tested)  UPPER EXTREMITY MMT:     MMT Right eval Left eval  Shoulder flexion    Shoulder abduction    Shoulder adduction    Shoulder extension    Shoulder internal rotation    Shoulder external rotation    Middle trapezius    Lower trapezius    Elbow flexion    Elbow extension    Wrist flexion    Wrist extension    Wrist ulnar deviation    Wrist radial deviation    Wrist pronation    Wrist supination    (Blank rows = not tested)  HAND FUNCTION: {handfunction:27230}  COORDINATION: {otcoordination:27237}  SENSATION: {sensation:27233}  EDEMA: ***  MUSCLE TONE: {UETONE:25567}  COGNITION: Overall cognitive status: {cognition:24006}  VISION: Subjective report: *** Baseline vision: {OTBASELINEVISION:25363} Visual history: {OTVISUALHISTORY:25364}  VISION ASSESSMENT: {visionassessment:27231}  Patient has difficulty with following activities due to following visual impairments: ***  PERCEPTION: {Perception:25564}  PRAXIS: {  Praxis:25565}  OBSERVATIONS: ***                                                                                                                              TREATMENT DATE: ***         PATIENT EDUCATION: Education details: *** Person educated: {Person educated:25204} Education method: {Education Method:25205} Education comprehension: {Education Comprehension:25206}  HOME EXERCISE PROGRAM: ***   GOALS: Goals reviewed with patient? {yes/no:20286}  SHORT TERM GOALS: Target date: ***  *** Baseline: Goal status: INITIAL  2.  *** Baseline:  Goal status: INITIAL  3.  *** Baseline:  Goal status: INITIAL  4.  *** Baseline:  Goal status: INITIAL  5.  *** Baseline:  Goal status: INITIAL  6.  *** Baseline:  Goal status: INITIAL  LONG TERM GOALS: Target date: ***  *** Baseline:  Goal status: INITIAL  2.  *** Baseline:  Goal status: INITIAL  3.  *** Baseline:  Goal status: INITIAL  4.  *** Baseline:  Goal status: INITIAL  5.  *** Baseline:  Goal status: INITIAL  6.  *** Baseline:  Goal status: INITIAL  ASSESSMENT:  CLINICAL IMPRESSION: Patient is a *** y.o. *** who was seen today for occupational therapy evaluation for ***.   PERFORMANCE DEFICITS: in functional skills including {OT physical skills:25468}, cognitive skills including {OT cognitive skills:25469}, and psychosocial skills including {OT psychosocial skills:25470}.   IMPAIRMENTS: are limiting patient from {OT performance deficits:25471}.   CO-MORBIDITIES: {Comorbidities:25485} that affects occupational performance. Patient will benefit from skilled OT to address above impairments and improve overall function.  MODIFICATION OR ASSISTANCE TO COMPLETE EVALUATION: {OT modification:25474}  OT OCCUPATIONAL PROFILE AND HISTORY: {OT PROFILE AND HISTORY:25484}  CLINICAL DECISION MAKING: {OT CDM:25475}  REHAB POTENTIAL: {rehabpotential:25112}  EVALUATION COMPLEXITY: {Evaluation complexity:25115}    PLAN:  OT FREQUENCY: {rehab frequency:25116}  OT DURATION:  {rehab duration:25117}  PLANNED INTERVENTIONS: {OT Interventions:25467}  RECOMMENDED OTHER SERVICES: ***  CONSULTED AND AGREED WITH PLAN OF CARE: {NFA:21308}  PLAN FOR NEXT SESSION: ***   Wynetta Emery, OT 09/14/2023, 9:03 AM

## 2023-09-18 ENCOUNTER — Ambulatory Visit: Payer: 59 | Admitting: Occupational Therapy

## 2023-09-18 NOTE — Therapy (Deleted)
OUTPATIENT OCCUPATIONAL THERAPY NEURO EVALUATION  Patient Name: Shannon Garza MRN: [id number] DOB:September 07, 1965, 58 y.o., female Today's Date: 09/18/2023  PCP: Georgina Quint, MD  REFERRING PROVIDER: Horton Chin, MD   END OF SESSION:   Past Medical History:  Diagnosis Date   Diabetes mellitus without complication (HCC)    Hypertension    No past surgical history on file. Patient Active Problem List   Diagnosis Date Noted   History of stroke 01/17/2021   Dyslipidemia 01/17/2021   Primary hypertension     ONSET DATE: 08/21/23 (referral date), CVA 10/12/20 - chronic   REFERRING DIAG: R29.898 (ICD-10-CM) - Left arm weakness   THERAPY DIAG:  No diagnosis found.  Rationale for Evaluation and Treatment: Rehabilitation  SUBJECTIVE:   SUBJECTIVE STATEMENT: ***  Pt accompanied by: {accompnied:27141}  PERTINENT HISTORY: HTN, subcortical infarction (2022), prediabetes  Pt was previously seen by OT to address L arm weakness in November 2023. Per 07/19/22 Outpt OT Treatment Note: "Chronic stroke from Feb 2022. She was seen here in May 2022, and just finished course of 4 PT visits in Sept 2023 for L shoulder pain."  PRECAUTIONS: {Therapy precautions:24002}  WEIGHT BEARING RESTRICTIONS: {Yes ***/No:24003}  PAIN:  Are you having pain? {OPRCPAIN:27236}  FALLS: Has patient fallen in last 6 months? {fallsyesno:27318}  LIVING ENVIRONMENT: Lives with: {OPRC lives with:25569::"lives with their family"} Lives in: {Lives in:25570} Stairs: {opstairs:27293} Has following equipment at home: {Assistive devices:23999}  PLOF: {PLOF:24004}  PATIENT GOALS: ***  OBJECTIVE:  Note: Objective measures were completed at Evaluation unless otherwise noted.  HAND DOMINANCE: {MISC; OT HAND DOMINANCE:[phone]}  ADLs: Overall ADLs: *** Transfers/ambulation related to ADLs: Eating: *** Grooming: *** UB Dressing: *** LB Dressing: *** Toileting: *** Bathing: *** Tub  Shower transfers: *** Equipment: {equipment:25573}  IADLs: Shopping: *** Light housekeeping: *** Meal Prep: *** Community mobility: *** Medication management: *** Financial management: *** Handwriting: {OTWRITTENEXPRESSION:25361}  MOBILITY STATUS: {OTMOBILITY:25360}  POSTURE COMMENTS:  {posture:25561} Sitting balance: {sitting balance:25483}  ACTIVITY TOLERANCE: Activity tolerance: ***  FUNCTIONAL OUTCOME MEASURES: {OTFUNCTIONALMEASURES:27238}  UPPER EXTREMITY ROM:    {AROM/PROM:27142} ROM Right eval Left eval  Shoulder flexion    Shoulder abduction    Shoulder adduction    Shoulder extension    Shoulder internal rotation    Shoulder external rotation    Elbow flexion    Elbow extension    Wrist flexion    Wrist extension    Wrist ulnar deviation    Wrist radial deviation    Wrist pronation    Wrist supination    (Blank rows = not tested)  UPPER EXTREMITY MMT:     MMT Right eval Left eval  Shoulder flexion    Shoulder abduction    Shoulder adduction    Shoulder extension    Shoulder internal rotation    Shoulder external rotation    Middle trapezius    Lower trapezius    Elbow flexion    Elbow extension    Wrist flexion    Wrist extension    Wrist ulnar deviation    Wrist radial deviation    Wrist pronation    Wrist supination    (Blank rows = not tested)  HAND FUNCTION: {handfunction:27230}  COORDINATION: {otcoordination:27237}  SENSATION: {sensation:27233}  EDEMA: ***  MUSCLE TONE: {UETONE:25567}  COGNITION: Overall cognitive status: {cognition:24006}  VISION: Subjective report: *** Baseline vision: {OTBASELINEVISION:25363} Visual history: {OTVISUALHISTORY:25364}  VISION ASSESSMENT: {visionassessment:27231}  Patient has difficulty with following activities due to following visual impairments: ***  PERCEPTION: {Perception:25564}  PRAXIS: {  Praxis:25565}  OBSERVATIONS: ***                                                                                                                              TREATMENT DATE: ***         PATIENT EDUCATION: Education details: *** Person educated: {Person educated:25204} Education method: {Education Method:25205} Education comprehension: {Education Comprehension:25206}  HOME EXERCISE PROGRAM: ***   GOALS: Goals reviewed with patient? {yes/no:20286}  SHORT TERM GOALS: Target date: ***  *** Baseline: Goal status: INITIAL  2.  *** Baseline:  Goal status: INITIAL  3.  *** Baseline:  Goal status: INITIAL  4.  *** Baseline:  Goal status: INITIAL  5.  *** Baseline:  Goal status: INITIAL  6.  *** Baseline:  Goal status: INITIAL  LONG TERM GOALS: Target date: ***  *** Baseline:  Goal status: INITIAL  2.  *** Baseline:  Goal status: INITIAL  3.  *** Baseline:  Goal status: INITIAL  4.  *** Baseline:  Goal status: INITIAL  5.  *** Baseline:  Goal status: INITIAL  6.  *** Baseline:  Goal status: INITIAL  ASSESSMENT:  CLINICAL IMPRESSION: Patient is a *** y.o. *** who was seen today for occupational therapy evaluation for ***.   PERFORMANCE DEFICITS: in functional skills including {OT physical skills:25468}, cognitive skills including {OT cognitive skills:25469}, and psychosocial skills including {OT psychosocial skills:25470}.   IMPAIRMENTS: are limiting patient from {OT performance deficits:25471}.   CO-MORBIDITIES: {Comorbidities:25485} that affects occupational performance. Patient will benefit from skilled OT to address above impairments and improve overall function.  MODIFICATION OR ASSISTANCE TO COMPLETE EVALUATION: {OT modification:25474}  OT OCCUPATIONAL PROFILE AND HISTORY: {OT PROFILE AND HISTORY:25484}  CLINICAL DECISION MAKING: {OT CDM:25475}  REHAB POTENTIAL: {rehabpotential:25112}  EVALUATION COMPLEXITY: {Evaluation complexity:25115}    PLAN:  OT FREQUENCY: {rehab frequency:25116}  OT DURATION:  {rehab duration:25117}  PLANNED INTERVENTIONS: {OT Interventions:25467}  RECOMMENDED OTHER SERVICES: ***  CONSULTED AND AGREED WITH PLAN OF CARE: {ZOX:09604}  PLAN FOR NEXT SESSION: ***   Wynetta Emery, OT 09/18/2023, 8:45 AM

## 2023-10-24 ENCOUNTER — Encounter: Payer: Self-pay | Admitting: Gastroenterology

## 2023-11-09 ENCOUNTER — Telehealth: Payer: Self-pay | Admitting: *Deleted

## 2023-11-09 NOTE — Telephone Encounter (Signed)
 Attempt to reach pt for pre-visit. No answer  Will attempt to reach again in 5 min due to no other # listed in profile  Second attempt to reach pt for pre-vist unsuccessful. No answer.

## 2023-11-13 ENCOUNTER — Encounter: Payer: 59 | Attending: Physical Medicine and Rehabilitation | Admitting: Physical Medicine and Rehabilitation

## 2023-11-13 ENCOUNTER — Encounter

## 2023-11-13 DIAGNOSIS — R531 Weakness: Secondary | ICD-10-CM | POA: Insufficient documentation

## 2023-11-13 DIAGNOSIS — M19041 Primary osteoarthritis, right hand: Secondary | ICD-10-CM | POA: Insufficient documentation

## 2023-11-13 DIAGNOSIS — I639 Cerebral infarction, unspecified: Secondary | ICD-10-CM | POA: Diagnosis not present

## 2023-11-13 DIAGNOSIS — E66813 Obesity, class 3: Secondary | ICD-10-CM | POA: Diagnosis not present

## 2023-11-13 DIAGNOSIS — R7303 Prediabetes: Secondary | ICD-10-CM | POA: Insufficient documentation

## 2023-11-13 DIAGNOSIS — I10 Essential (primary) hypertension: Secondary | ICD-10-CM | POA: Diagnosis not present

## 2023-11-13 DIAGNOSIS — Z6841 Body Mass Index (BMI) 40.0 and over, adult: Secondary | ICD-10-CM | POA: Insufficient documentation

## 2023-11-13 DIAGNOSIS — M79605 Pain in left leg: Secondary | ICD-10-CM | POA: Insufficient documentation

## 2023-11-13 NOTE — Progress Notes (Addendum)
 Subjective:    Patient ID: Shannon Garza, female    DOB: Nov 15, 1965, 58 y.o.   MRN: [id number]  HPI   An audio/video tele-health visit is felt to be the most appropriate encounter for this patient at this time. This is a follow up tele-visit via MyChart Video. The patient is at home. MD is at office. Prior to scheduling this appointment, our staff discussed the limitations of evaluation and management by telemedicine and the availability of in-person appointments. The patient expressed understanding and agreed to proceed.   Shannon Garza presents for f/u of HTN, weight loss  1) Weakness in left side -she feels overall stronger -leg is feeling stronger -makes herself pick up things at home -she feels weaker some times at night -she was concerned regarding whether she was having repeat stroke -she is doing well with walking. -she is still working on hand strengthening. -strength is improving  2) HTN: -has not checked blood pressure athome.  -has been 1302/140s/80s -she has been trying to get off her blood pressure medications -she has been taking Lisinopril 40mg  but not every day -she is not taking amlodipine -she felt some numbness in her mouth yesterday when her BP was elevated -she does not eat kiwi -she continues to take her blood pressure medication -she recently started back on the lisinopril as she was tried on another blood pressure medication and it made her dizziness.  -she is going to get beet root powder  3) obesity: -she started walking yesterday- loves walking -she does not eat much -she tries to stay off the sweets -she stopped eating a lot -she feels she needs to build up her fruit and vegetable intake -she ate pasta last night and this was ok -she eats salads -she finally got away from her sister-in-law who cooks unhealthy foods.  -she has continued to lose a lot of weight! -she lost 7 lbs since her last visit on 12/1 -she has [place] nuts, cashew nuts.   -she is doing ok with walking, she has not recently since it has been cold.  -she tries not to eat after 6pm -she is thinking of doing a cleanse.  -she caught herself eating more during the holidays.  -she bout a health drink and she finds it calms her down  4) diabetes: has been 82 when she checked at home.  -she been very careful with her diet.  -she has been trying to eat about 2 meals per day.  -she eats Ezekial bread and peanut butter with bananas on top of it -sometimes she will eat oatmeal of grits. -she uses yellow grits.   5) fatigue:  -sister in law concerned about this and recommended that she postpone her return to work.   6) h/o stroke: -followed yo with Dr. Benita Stabile  7) Bladder dysfunction -she feels she is urinating too much She feels she has too much flow at a time -when her feet swell she goes more frequently -she is getting incontinent at times   8) RLE pain: -discussed that ultrasound was negative for clot  9) LLE pain: -started to hurt 2 months ago  10) Right hand pain -present since stroke   Pain Inventory Average Pain 4 Pain Right Now 0 My pain is intermittent and sharp  In the last 24 hours, has pain interfered with the following? General activity 5 Relation with others 0 Enjoyment of life 0 What TIME of day is your pain at its worst? evening Sleep (in general) Good  Pain  is worse with: walking and some activites Pain improves with: therapy/exercise Relief from Meds: 2    Family History  Problem Relation Age of Onset   Sleep apnea Neg Hx    Social History   Socioeconomic History   Marital status: Single    Spouse name: Not on file   Number of children: Not on file   Years of education: Not on file   Highest education level: Not on file  Occupational History   Not on file  Tobacco Use   Smoking status: Never   Smokeless tobacco: Never  Vaping Use   Vaping status: Never Used  Substance and Sexual Activity   Alcohol use: Not  Currently   Drug use: Not Currently   Sexual activity: Not Currently  Other Topics Concern   Not on file  Social History Narrative   Lives with sister in law and brother, and sister in own home   Right Handed   Drinks no caffeine   Social Drivers of Corporate investment banker Strain: Not on file  Food Insecurity: Not on file  Transportation Needs: Not on file  Physical Activity: Not on file  Stress: Not on file  Social Connections: Not on file   No past surgical history on file. Past Medical History:  Diagnosis Date   Diabetes mellitus without complication (HCC)    Hypertension    There were no vitals taken for this visit.  Opioid Risk Score:   Fall Risk Score:  `1  Depression screen [facility] 2/9     09/05/2023    2:34 PM 08/17/2023    9:41 AM 04/06/2023    9:44 AM 12/19/2022   10:03 AM 09/13/2021   10:54 AM 04/19/2021    9:25 AM 01/17/2021   11:04 AM  Depression screen PHQ 2/9  Decreased Interest 0 0 0 2 0 0 0  Down, Depressed, Hopeless 0 0 0 1 0 0 0  PHQ - 2 Score 0 0 0 3 0 0 0    Review of Systems  Constitutional: Negative.   HENT: Negative.    Eyes: Negative.   Respiratory: Negative.    Cardiovascular: Negative.   Gastrointestinal: Negative.   Endocrine: Negative.   Genitourinary: Negative.   Musculoskeletal:  Positive for gait problem.       Pain in right ankle/leg  Skin: Negative.   Allergic/Immunologic: Negative.   Hematological: Negative.   Psychiatric/Behavioral: Negative.    All other systems reviewed and are negative.      Objective:   Physical Exam Not performed    Assessment & Plan:  Shannon Garza is a 58 year old woman who presents for HTN f/u  1) Subcortical infarction -continue HEP, add resistance training every other day -discussed that beans are a great source of methylfolate -discussed weakness and prognosis -walk 15 minutes per day.  -may restart work on 8/11 -encouraged weight loss.  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  -continue B vitamins -continue crestor  2) Prediabetes: -Reviewed HgbA1c  -will recheck next time -discussed that celery juice can be helpful -avoid sugar, bread, pasta, rice -avoid snacking -try to incorporate into your diet some of the following foods which are good for diabetes: 1) cinnamon- imitates effects of insulin, increasing glucose transport into cells ([place] or [place] cinnamon is best, least processed) 2) nuts- can slow down the blood sugar response of carbohydrate rich foods 3) oatmeal- contains and anti-inflammatory compound avenanthramide 4)  whole-milk yogurt (best types are no sugar, [place] yogurt, or goat/sheep yogurt) 5) beans- high in protein, fiber, and vitamins, low glycemic index 6) broccoli- great source of vitamin A and C 7) quinoa- higher in protein and fiber than other grains 8) spinach- high in vitamin A, fiber, and protein 9) olive oil- reduces glucose levels, LDL, and triglycerides 10) salmon- excellent amount of omega-3-fatty acids 11) walnuts- rich in antioxidants 12) apples- high in fiber and quercetin 13) carrots- highly nutritious with low impact on blood sugar 14) eggs- improve HDL (good cholesterol), high in protein, keep you satiated 15) turmeric: improves blood sugars, cardiovascular disease, and protects kidney health 16) garlic: improves blood sugar, blood pressure, pain 17) tomatoes: highly nutritious with low impact on blood sugar   3) HTN: -commended on improvements in BP! -continue amlodipine 5mg  daily -discussee starting resistance training -continue Lisinopril 40mg  daily -discussed clonidine but side effects are not acceptable  -advised eating beets, kiwi, nuts, high potassium fruits weekly -advised to let me know in 2-3 days if BP is not trending down -Advised checking BP daily at home and logging results to bring into follow-up appointment with PCP and myself. -Reviewed BP meds  today.  -Advised regarding healthy foods that can help lower blood pressure and provided with a list: 1) citrus foods- high in vitamins and minerals 2) salmon and other fatty fish - reduces inflammation and oxylipins 3) swiss chard (leafy green)- high level of nitrates 4) pumpkin seeds- one of the best natural sources of magnesium 5) Beans and lentils- high in fiber, magnesium, and potassium 6) Berries- high in flavonoids 7) Amaranth (whole grain, can be cooked similarly to rice and oats)- high in magnesium and fiber 8) Pistachios- even more effective at reducing BP than other nuts 9) Carrots- high in phenolic compounds that relax blood vessels and reduce inflammation 10) Celery- contain phthalides that relax tissues of arterial walls 11) Tomatoes- can also improve cholesterol and reduce risk of heart disease 12) Broccoli- good source of magnesium, calcium, and potassium 13) Greek yogurt: high in potassium and calcium 14) Herbs and spices: Celery seed, cilantro, saffron, lemongrass, black cumin, ginseng, cinnamon, cardamom, sweet basil, and ginger 15) Chia and flax seeds- also help to lower cholesterol and blood sugar 16) Beets- high levels of nitrates that relax blood vessels  17) spinach and bananas- high in potassium  -Provided lise of supplements that can help with hypertension:  1) magnesium: one high quality brand is Bioptemizers since it contains all 7 types of magnesium, otherwise over the counter magnesium gluconate 400mg  is a good option 2) B vitamins 3) vitamin D 4) potassium 5) CoQ10 6) L-arginine 7) Vitamin C 8) Beetroot -Educated that goal BP is 120/80. -Made goal to incorporate some of the above foods into diet.    4) Obesity: -discussed that she does not eat much -encouraged walking -discussed that 1-2 lbs weight loss per week is safe -encouraged fruits and vegetables -recommended replacing morning cereal with yogurt Discussed that she lost 28 lbs! -discussed  that she is already eating a very healthy diet -Educated regarding health benefits of weight loss- for pain, general health, chronic disease prevention, immune health, mental health.  -Will monitor weight every visit.  -Consider [place] tea daily.  -Discussed the benefits of intermittent fasting. -Discussed foods that can assist in weight loss: 1) leafy greens- high in fiber and nutrients 2) dark chocolate- improves metabolism (if prefer sweetened, best to sweeten with honey instead of sugar).  3) cruciferous vegetables-  high in fiber and protein 4) full fat yogurt: high in healthy fat, protein, calcium, and probiotics 5) apples- high in a variety of phytochemicals 6) nuts- high in fiber and protein that increase feelings of fullness 7) grapefruit: rich in nutrients, antioxidants, and fiber (not to be taken with anticoagulation) 8) beans- high in protein and fiber 9) salmon- has high quality protein and healthy fats 10) green tea- rich in polyphenols 11) eggs- rich in choline and vitamin D 12) tuna- high protein, boosts metabolism 13) avocado- decreases visceral abdominal fat 14) chicken (pasture raised): high in protein and iron 15) blueberries- reduce abdominal fat and cholesterol 16) whole grains- decreases calories retained during digestion, speeds metabolism 17) chia seeds- curb appetite 18) chilies- increases fat metabolism  -Discussed supplements that can be used:  1) Metatrim 400mg  BID 30 minutes before breakfast and dinner  2) Sphaeranthus indicus and Garcinia mangostana (combinations of these and #1 can be found in capsicum and zychrome  3) green coffee bean extract 400mg  twice per day or [place]ia (african mango) 150 to 300mg  twice per day.  5) Bladder dysfunction -ccontinue slippery elm since appears to be helping  6) Right leg pain: resolved  7) LLE pain: -discussed that this has improved -discussed that she had bought a shoe from [place]  -Discussed current  symptoms of pain and history of pain.  -Discussed benefits of exercise in reducing pain. -Discussed following foods that may reduce pain: 1) Ginger (especially studied for arthritis)- reduce leukotriene production to decrease inflammation 2) Blueberries- high in phytonutrients that decrease inflammation 3) Salmon- marine omega-3s reduce joint swelling and pain 4) Pumpkin seeds- reduce inflammation 5) dark chocolate- reduces inflammation 6) turmeric- reduces inflammation 7) tart cherries - reduce pain and stiffness 8) extra virgin olive oil - its compound olecanthal helps to block prostaglandins  9) chili peppers- can be eaten or applied topically via capsaicin 10) mint- helpful for headache, muscle aches, joint pain, and itching 11) garlic- reduces inflammation  Link to further information on diet for chronic pain: [email or web address]   8) Right hand OA:  -recommended blue emu oil, voltaren gel  -prescribed therapy  -continue turmeric

## 2023-11-15 ENCOUNTER — Ambulatory Visit (AMBULATORY_SURGERY_CENTER)

## 2023-11-15 VITALS — Ht 64.0 in | Wt 251.8 lb

## 2023-11-15 DIAGNOSIS — Z1211 Encounter for screening for malignant neoplasm of colon: Secondary | ICD-10-CM

## 2023-11-15 MED ORDER — NA SULFATE-K SULFATE-MG SULF 17.5-3.13-1.6 GM/177ML PO SOLN
1.0000 | Freq: Once | ORAL | 0 refills | Status: AC
Start: 2023-11-15 — End: 2023-11-15

## 2023-11-15 NOTE — Patient Instructions (Signed)
 [facility] has implemented a new process for scheduling procedures.  Please note your arrival time for the [facility] is your appointment time that is shown on your written instructions.  Please do not arrive one hour prior to the time listed in your instructions.  Please ignore any outside notifications to arrive one hour early.  We apologize for any confusion and look forward to seeing you for your procedure.

## 2023-11-15 NOTE — Progress Notes (Signed)
 No egg or soy allergy known to patient  No issues known to pt with past sedation with any surgeries or procedures Patient denies ever being told they had issues or difficulty with intubation  No FH of Malignant Hyperthermia Pt is not on diet pills Pt is not on  home 02  Pt is not on blood thinners  Pt with occ constipation  No A fib or A flutter Have any cardiac testing pending-- no  LOA: independent  Prep: suprep   PV completed with patient. Prep instructions sent via mychart hard copy given at [facility] apt

## 2023-12-07 ENCOUNTER — Encounter: Payer: 59 | Admitting: Gastroenterology

## 2024-02-05 ENCOUNTER — Encounter: Admitting: Registered Nurse

## 2024-02-05 ENCOUNTER — Ambulatory Visit: Admitting: Physical Medicine and Rehabilitation

## 2024-02-15 ENCOUNTER — Encounter: Admitting: Registered Nurse

## 2024-02-22 ENCOUNTER — Encounter: Admitting: Registered Nurse

## 2024-02-27 ENCOUNTER — Encounter: Attending: Registered Nurse | Admitting: Registered Nurse

## 2024-02-27 VITALS — BP 143/81 | HR 64 | Ht 64.0 in | Wt 252.0 lb

## 2024-02-27 DIAGNOSIS — I639 Cerebral infarction, unspecified: Secondary | ICD-10-CM | POA: Diagnosis not present

## 2024-02-27 DIAGNOSIS — E7849 Other hyperlipidemia: Secondary | ICD-10-CM | POA: Diagnosis present

## 2024-02-27 DIAGNOSIS — I10 Essential (primary) hypertension: Secondary | ICD-10-CM | POA: Diagnosis not present

## 2024-02-27 NOTE — Progress Notes (Signed)
 Subjective:    Patient ID: Shannon Garza, female    DOB: 12-Nov-1965, 58 y.o.   MRN: [id number]  HPI: Shannon Garza is a 58 y.o. female who returns for follow up appointment for chronic pain and medication refill. She reports no pain today. She rated her pain on the Health and History form 2. Hier current exercise regime is walking and performing stretching exercises.    Pain Inventory Average Pain 2 Pain Right Now 2 My pain is dull and aching  In the last 24 hours, has pain interfered with the following? General activity 0 Relation with others 1 Enjoyment of life 1 What TIME of day is your pain at its worst? varies Sleep (in general) Good  Pain is worse with: nothing Pain improves with: nothing Relief from Meds: .  Family History  Problem Relation Age of Onset   Sleep apnea Neg Hx    Colon cancer Neg Hx    Rectal cancer Neg Hx    Stomach cancer Neg Hx    Social History   Socioeconomic History   Marital status: Single    Spouse name: Not on file   Number of children: Not on file   Years of education: Not on file   Highest education level: Not on file  Occupational History   Not on file  Tobacco Use   Smoking status: Never   Smokeless tobacco: Never  Vaping Use   Vaping status: Never Used  Substance and Sexual Activity   Alcohol use: Not Currently   Drug use: Not Currently   Sexual activity: Not Currently  Other Topics Concern   Not on file  Social History Narrative   Lives with sister in law and brother, and sister in own home   Right Handed   Drinks no caffeine   Social Drivers of Corporate investment banker Strain: Not on file  Food Insecurity: Not on file  Transportation Needs: Not on file  Physical Activity: Not on file  Stress: Not on file  Social Connections: Not on file   No past surgical history on file. No past surgical history on file. Past Medical History:  Diagnosis Date   Diabetes mellitus without complication (HCC)     Hypertension    BP (!) 143/81   Pulse 64   Ht 5' 4 (1.626 m)   Wt 252 lb (114.3 kg)   SpO2 94%   BMI 43.26 kg/m   Opioid Risk Score:   Fall Risk Score:  `1  Depression screen [facility] 2/9     09/05/2023    2:34 PM 08/17/2023    9:41 AM 04/06/2023    9:44 AM 12/19/2022   10:03 AM 09/13/2021   10:54 AM 04/19/2021    9:25 AM 01/17/2021   11:04 AM  Depression screen PHQ 2/9  Decreased Interest 0 0 0 2 0 0 0  Down, Depressed, Hopeless 0 0 0 1 0 0 0  PHQ - 2 Score 0 0 0 3 0 0 0      Review of Systems  Musculoskeletal:        Pain in right thumb  All other systems reviewed and are negative.      Objective:   Physical Exam Vitals and nursing note reviewed.  Constitutional:      Appearance: Normal appearance.  Cardiovascular:     Rate and Rhythm: Normal rate and regular rhythm.     Pulses: Normal pulses.     Heart sounds: Normal heart sounds.  Pulmonary:     Effort: Pulmonary effort is normal.     Breath sounds: Normal breath sounds.  Musculoskeletal:     Comments: Normal Muscle Bulk and Muscle Testing Reveals:  Upper Extremities:Full  ROM and Muscle Strength 5/5  Lower Extremities: Full ROM and Muscle Strength 5/5 Arises from Table with ease Narrow Based  Gait     Skin:    General: Skin is warm and dry.  Neurological:     Mental Status: She is alert and oriented to person, place, and time.  Psychiatric:        Mood and Affect: Mood normal.        Behavior: Behavior normal.          Assessment & Plan:  Subcortical Infarction: Continue HEP as Tolerated. Continue current medication regimen/  Essential Hypertension: Continue current medication regimen. Continue to monitor.  Hyperlipidemia: Continue current medication regimen, continue to monitor.   F/U with Dr Gaylene

## 2024-02-27 NOTE — Patient Instructions (Addendum)
 Call your Primary Care: [phone] Dr Purcell: called you on January 8th, 2025 regarding your cholesterol   regarding your Blood Work:  Blood work was drawn in January 2025,   Cholesterol, HDL and LDL  Call today,   Also ask your Primary  care and pharmacy   about your Crestor  and speak to your primary doctor

## 2024-03-08 ENCOUNTER — Encounter: Payer: Self-pay | Admitting: Registered Nurse

## 2024-04-30 ENCOUNTER — Other Ambulatory Visit: Payer: Self-pay | Admitting: Physical Medicine and Rehabilitation

## 2024-04-30 DIAGNOSIS — I10 Essential (primary) hypertension: Secondary | ICD-10-CM

## 2024-05-08 ENCOUNTER — Telehealth: Payer: Self-pay | Admitting: Emergency Medicine

## 2024-05-08 ENCOUNTER — Ambulatory Visit: Payer: Self-pay | Admitting: Emergency Medicine

## 2024-05-08 NOTE — Telephone Encounter (Unsigned)
 Copied from CRM #[id number]. Topic: Clinical - Medication Refill >> May 08, 2024  3:00 PM Shereese L wrote: Medication: lisinopril  (ZESTRIL ) 40 MG tablet  Has the patient contacted their pharmacy? Yes (Agent: If no, request that the patient contact the pharmacy for the refill. If patient does not wish to contact the pharmacy document the reason why and proceed with request.) (Agent: If yes, when and what did the pharmacy advise?)  This is the patient's preferred pharmacy:  [id number] [address], [place] - [address] [place] [address] Phone: [phone] Fax: [phone]  Is this the correct pharmacy for this prescription? Yes If no, delete pharmacy and type the correct one.   Has the prescription been filled recently? Yes  Is the patient out of the medication? Yes  Has the patient been seen for an appointment in the last year OR does the patient have an upcoming appointment? Yes  Can we respond through MyChart? Yes  Agent: Please be advised that Rx refills may take up to 3 business days. We ask that you follow-up with your pharmacy.

## 2024-05-08 NOTE — Telephone Encounter (Signed)
 Copied from CRM #[id number]. Topic: Clinical - Medication Refill >> May 08, 2024  3:00 PM Shereese L wrote: Medication: lisinopril  (ZESTRIL ) 40 MG tablet  Has the patient contacted their pharmacy? Yes (Agent: If no, request that the patient contact the pharmacy for the refill. If patient does not wish to contact the pharmacy document the reason why and proceed with request.) (Agent: If yes, when and what did the pharmacy advise?)  This is the patient's preferred pharmacy:  [id number] [address] [place], [place] - [address] [place] [address] Phone: [phone] Fax: [phone]  Is this the correct pharmacy for this prescription? Yes If no, delete pharmacy and type the correct one.   Has the prescription been filled recently? Yes  Is the patient out of the medication? Yes  Has the patient been seen for an appointment in the last year OR does the patient have an upcoming appointment? Yes  Can we respond through MyChart? Yes  Agent: Please be advised that Rx refills may take up to 3 business days. We ask that you follow-up with your pharmacy.

## 2024-05-09 ENCOUNTER — Other Ambulatory Visit: Payer: Self-pay | Admitting: Radiology

## 2024-05-09 DIAGNOSIS — I10 Essential (primary) hypertension: Secondary | ICD-10-CM

## 2024-05-09 MED ORDER — LISINOPRIL 40 MG PO TABS: 40.0000 mg | ORAL_TABLET | Freq: Every day | ORAL | 3 refills | Status: AC

## 2024-05-09 NOTE — Telephone Encounter (Signed)
 Rx sent.

## 2024-06-02 ENCOUNTER — Encounter: Attending: Registered Nurse | Admitting: Physical Medicine and Rehabilitation

## 2024-06-02 DIAGNOSIS — I639 Cerebral infarction, unspecified: Secondary | ICD-10-CM | POA: Insufficient documentation

## 2024-06-02 DIAGNOSIS — E7849 Other hyperlipidemia: Secondary | ICD-10-CM | POA: Insufficient documentation

## 2024-06-02 DIAGNOSIS — I10 Essential (primary) hypertension: Secondary | ICD-10-CM | POA: Insufficient documentation
